# Patient Record
Sex: Female | Born: 1985 | Race: Black or African American | Hispanic: No | Marital: Married | Smoking: Former smoker
Health system: Southern US, Community
[De-identification: ages and names within clinical notes are randomized; demographics above are authoritative.]

## PROBLEM LIST (undated history)

## (undated) DIAGNOSIS — F4310 Post-traumatic stress disorder, unspecified: Secondary | ICD-10-CM

## (undated) DIAGNOSIS — G8929 Other chronic pain: Secondary | ICD-10-CM

## (undated) DIAGNOSIS — M62838 Other muscle spasm: Secondary | ICD-10-CM

## (undated) DIAGNOSIS — M549 Dorsalgia, unspecified: Secondary | ICD-10-CM

## (undated) DIAGNOSIS — J45909 Unspecified asthma, uncomplicated: Secondary | ICD-10-CM

## (undated) DIAGNOSIS — E119 Type 2 diabetes mellitus without complications: Secondary | ICD-10-CM

## (undated) DIAGNOSIS — F419 Anxiety disorder, unspecified: Secondary | ICD-10-CM

## (undated) DIAGNOSIS — D649 Anemia, unspecified: Secondary | ICD-10-CM

## (undated) DIAGNOSIS — F319 Bipolar disorder, unspecified: Secondary | ICD-10-CM

## (undated) DIAGNOSIS — I10 Essential (primary) hypertension: Secondary | ICD-10-CM

## (undated) HISTORY — DX: Bipolar disorder, unspecified: F31.9

## (undated) HISTORY — PX: WISDOM TOOTH EXTRACTION: SHX21

## (undated) HISTORY — PX: MOLE REMOVAL: SHX2046

## (undated) HISTORY — DX: Post-traumatic stress disorder, unspecified: F43.10

## (undated) HISTORY — DX: Anxiety disorder, unspecified: F41.9

## (undated) HISTORY — DX: Other muscle spasm: M62.838

## (undated) HISTORY — DX: Type 2 diabetes mellitus without complications: E11.9

---

## 2007-04-27 ENCOUNTER — Inpatient Hospital Stay (HOSPITAL_COMMUNITY): Admission: AD | Admit: 2007-04-27 | Discharge: 2007-04-27 | Payer: Self-pay | Admitting: Obstetrics and Gynecology

## 2007-04-27 ENCOUNTER — Inpatient Hospital Stay (HOSPITAL_COMMUNITY): Admission: AD | Admit: 2007-04-27 | Discharge: 2007-04-28 | Payer: Self-pay | Admitting: Obstetrics and Gynecology

## 2007-05-12 ENCOUNTER — Emergency Department (HOSPITAL_COMMUNITY): Admission: EM | Admit: 2007-05-12 | Discharge: 2007-05-12 | Payer: Self-pay | Admitting: Emergency Medicine

## 2007-10-28 ENCOUNTER — Emergency Department (HOSPITAL_COMMUNITY): Admission: EM | Admit: 2007-10-28 | Discharge: 2007-10-29 | Payer: Self-pay | Admitting: Emergency Medicine

## 2007-11-24 ENCOUNTER — Emergency Department (HOSPITAL_COMMUNITY): Admission: EM | Admit: 2007-11-24 | Discharge: 2007-11-24 | Payer: Self-pay | Admitting: Emergency Medicine

## 2008-04-03 ENCOUNTER — Emergency Department (HOSPITAL_COMMUNITY): Admission: EM | Admit: 2008-04-03 | Discharge: 2008-04-03 | Payer: Self-pay | Admitting: Emergency Medicine

## 2008-04-09 ENCOUNTER — Inpatient Hospital Stay (HOSPITAL_COMMUNITY): Admission: AD | Admit: 2008-04-09 | Discharge: 2008-04-09 | Payer: Self-pay | Admitting: Gynecology

## 2008-11-25 ENCOUNTER — Inpatient Hospital Stay (HOSPITAL_COMMUNITY): Admission: AD | Admit: 2008-11-25 | Discharge: 2008-11-27 | Payer: Self-pay | Admitting: Obstetrics and Gynecology

## 2009-12-30 ENCOUNTER — Ambulatory Visit: Payer: Self-pay | Admitting: Physician Assistant

## 2009-12-30 DIAGNOSIS — F39 Unspecified mood [affective] disorder: Secondary | ICD-10-CM | POA: Insufficient documentation

## 2009-12-30 DIAGNOSIS — J45909 Unspecified asthma, uncomplicated: Secondary | ICD-10-CM | POA: Insufficient documentation

## 2009-12-30 DIAGNOSIS — J069 Acute upper respiratory infection, unspecified: Secondary | ICD-10-CM | POA: Insufficient documentation

## 2009-12-30 DIAGNOSIS — F329 Major depressive disorder, single episode, unspecified: Secondary | ICD-10-CM

## 2009-12-30 DIAGNOSIS — G894 Chronic pain syndrome: Secondary | ICD-10-CM

## 2009-12-30 DIAGNOSIS — D509 Iron deficiency anemia, unspecified: Secondary | ICD-10-CM | POA: Insufficient documentation

## 2009-12-31 ENCOUNTER — Encounter: Payer: Self-pay | Admitting: Physician Assistant

## 2010-01-01 ENCOUNTER — Telehealth: Payer: Self-pay | Admitting: Physician Assistant

## 2010-01-01 DIAGNOSIS — E559 Vitamin D deficiency, unspecified: Secondary | ICD-10-CM | POA: Insufficient documentation

## 2010-01-01 LAB — CONVERTED CEMR LAB
AST: 21 units/L (ref 0–37)
Albumin: 4.5 g/dL (ref 3.5–5.2)
Anti Nuclear Antibody(ANA): NEGATIVE
BUN: 10 mg/dL (ref 6–23)
Barbiturate Quant, Ur: NEGATIVE
Basophils Relative: 0 % (ref 0–1)
CO2: 20 meq/L (ref 19–32)
Calcium: 9.4 mg/dL (ref 8.4–10.5)
Chloride: 107 meq/L (ref 96–112)
Cocaine Metabolites: NEGATIVE
Creatinine, Ser: 0.82 mg/dL (ref 0.40–1.20)
Creatinine,U: 247.7 mg/dL
Eosinophils Absolute: 0.2 10*3/uL (ref 0.0–0.7)
Eosinophils Relative: 4 % (ref 0–5)
Glucose, Bld: 85 mg/dL (ref 70–99)
HCT: 35.9 % — ABNORMAL LOW (ref 36.0–46.0)
Hemoglobin: 11.7 g/dL — ABNORMAL LOW (ref 12.0–15.0)
Lymphs Abs: 1.2 10*3/uL (ref 0.7–4.0)
MCHC: 32.6 g/dL (ref 30.0–36.0)
MCV: 89.1 fL (ref 78.0–100.0)
Methadone: NEGATIVE
Monocytes Absolute: 0.4 10*3/uL (ref 0.1–1.0)
Monocytes Relative: 9 % (ref 3–12)
Opiate Screen, Urine: NEGATIVE
Potassium: 4.4 meq/L (ref 3.5–5.3)
RBC: 4.03 M/uL (ref 3.87–5.11)
TSH: 0.492 microintl units/mL (ref 0.350–4.500)
WBC: 4.6 10*3/uL (ref 4.0–10.5)

## 2010-02-18 ENCOUNTER — Telehealth: Payer: Self-pay | Admitting: Physician Assistant

## 2010-03-04 ENCOUNTER — Encounter (INDEPENDENT_AMBULATORY_CARE_PROVIDER_SITE_OTHER): Payer: Self-pay | Admitting: *Deleted

## 2010-03-09 ENCOUNTER — Telehealth: Payer: Self-pay | Admitting: Physician Assistant

## 2010-03-19 ENCOUNTER — Ambulatory Visit: Payer: Self-pay | Admitting: Physician Assistant

## 2010-03-19 ENCOUNTER — Telehealth: Payer: Self-pay | Admitting: Physician Assistant

## 2010-03-19 DIAGNOSIS — E669 Obesity, unspecified: Secondary | ICD-10-CM

## 2010-03-19 DIAGNOSIS — M549 Dorsalgia, unspecified: Secondary | ICD-10-CM

## 2010-03-19 DIAGNOSIS — M545 Low back pain, unspecified: Secondary | ICD-10-CM | POA: Insufficient documentation

## 2010-03-19 DIAGNOSIS — D649 Anemia, unspecified: Secondary | ICD-10-CM | POA: Insufficient documentation

## 2010-03-20 LAB — CONVERTED CEMR LAB
Ferritin: 5 ng/mL — ABNORMAL LOW (ref 10–291)
RBC Folate: 728 ng/mL — ABNORMAL HIGH (ref 180–600)
Saturation Ratios: 10 % — ABNORMAL LOW (ref 20–55)
TIBC: 383 ug/dL (ref 250–470)
Vitamin B-12: 552 pg/mL (ref 211–911)

## 2010-04-06 ENCOUNTER — Encounter
Admission: RE | Admit: 2010-04-06 | Discharge: 2010-05-22 | Payer: Self-pay | Source: Home / Self Care | Admitting: Internal Medicine

## 2010-04-06 ENCOUNTER — Encounter: Payer: Self-pay | Admitting: Physician Assistant

## 2010-04-13 ENCOUNTER — Telehealth: Payer: Self-pay | Admitting: Physician Assistant

## 2010-04-16 ENCOUNTER — Encounter: Payer: Self-pay | Admitting: Physician Assistant

## 2010-04-24 ENCOUNTER — Ambulatory Visit: Payer: Self-pay | Admitting: Physician Assistant

## 2010-05-07 ENCOUNTER — Encounter: Payer: Self-pay | Admitting: Physician Assistant

## 2010-05-20 ENCOUNTER — Encounter: Payer: Self-pay | Admitting: Physician Assistant

## 2010-05-29 ENCOUNTER — Encounter: Payer: Self-pay | Admitting: Physician Assistant

## 2010-09-30 NOTE — Miscellaneous (Signed)
Summary: Rehab Report /INITIAL SUMMARY  Rehab Report /INITIAL SUMMARY   Imported By: Arta Bruce 04/10/2010 10:17:07  _____________________________________________________________________  External Attachment:    Type:   Image     Comment:   External Document

## 2010-09-30 NOTE — Progress Notes (Signed)
Summary: PT and Nutrition referral  Phone Note Outgoing Call   Summary of Call: Needs referral to:   1. PT for back pain  2. Nutrition for diabetes Initial call taken by: Brynda Rim,  March 19, 2010 5:36 PM  Follow-up for Phone Call        PT HAVE AN APPT [facility] 04-06-10 @ 8:45 AM I LEAVE A MESSAGE TO PT TO CALL ME BACK. NUTRICIONIST . I SEND THE REFERRAL TODAY WAITING FOR AN APPT . Follow-up by: Cheryll Dessert,  March 23, 2010 3:05 PM
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
## 2010-09-30 NOTE — Letter (Signed)
Summary: *HSN Results Follow up  [facility]  [address] [place], [place] [address]   Phone: [phone]  Fax: [phone]      03/04/2010   [facility] [address] [place], [place]  [address]   Dear  Ms. Hulan Fess,                            ____S.Drinkard,FNP   ____D. Gore,FNP       ____B. McPherson,MD   ____V. Rankins,MD    ____E. Mulberry,MD    ____N. Daphine Deutscher, FNP  ____D. Reche Dixon, MD    ____K. Philipp Deputy, MD    ____Other     This letter is to inform you that your recent test(s):  _______Pap Smear    _______Lab Test     _______X-ray    _______ is within acceptable limits  _______ requires a medication change  _______ requires a follow-up lab visit  _______ requires a follow-up visit with your provider   Comments:  We have been trying to reach you.  Please give the office a call at your earliest convenience.       _________________________________________________________ If you have any questions, please contact our office                     Sincerely,  Armenia Shannon [facility]

## 2010-09-30 NOTE — Assessment & Plan Note (Signed)
Summary: Not Seen; Medicaid Expired   Vitals Entered By: Arm[place] Shannon (May 29, 2010 10:45 AM)  Allergies: No Known Drug Allergies   Complete Medication List: 1)  Iron  .... Two times a day 2)  Proventil Hfa 108 (90 Base) Mcg/act Aers (Albuterol sulfate) .Marland Kitchen.. 1-2 puffs every 4-6 hours as needed 3)  Vitamin D 1000 Unit Tabs (Cholecalciferol) .... Take 1 tablet by mouth once a day 4)  Diclofenac Sodium 75 Mg Tbec (Diclofenac sodium) .... Take 1 tablet by mouth two times a day with food as needed for pain 5)  Flexeril 10 Mg Tabs (Cyclobenzaprine hcl) .... Take 1 tab by mouth at bedtime as needed for back pain or spasm 6)  Ferrous Sulfate 325 (65 Fe) Mg Tabs (Ferrous sulfate) .... Take 1 tablet by mouth three times a day

## 2010-09-30 NOTE — Assessment & Plan Note (Signed)
Summary: FU VISIT WITH THE PROVIDER/ KNEE PAIN/GK   Vital Signs:  Patient profile:   25 year old female Menstrual status:  regular Height:      69.75 inches Weight:      249 pounds BMI:     36.11 Temp:     98.0 degrees F oral Pulse rate:   76 / minute Pulse rhythm:   regular Resp:     18 per minute BP sitting:   114 / 73  (left arm) Cuff size:   large  Vitals Entered By: Arm[place] Shannon (March 19, 2010 4:11 PM) CC: pt is here for back pain.... pt says she is not depressed so she has not taken any meds... . Is Patient Diabetic? No Pain Assessment Patient in pain? no       Does patient need assistance? Functional Status Self care Ambulation Normal   Primary Care Provider:  Tereso Newcomer, PA-C  CC:  pt is here for back pain.... pt says she is not depressed so she has not taken any meds... ..  History of Present Illness: Here for back pain.  Notes injury years ago.  Was hit during  tackle football.  Notes having epidural for labor and delivery and had worsening back pain since.  Points to pain in thoracic and lumbar area.  Hurts to walk.  Notes pain in legs and ankles and cramps in muscles.  Notes when she wakes up in mornings.  No loss of bowel or bladder control. Notes numbness and tingling.  Very nonspecific and points to multiple area when describing pain.  Says ankles are weak.  Went to get special shoes and this is not working.  She is going to the [employer] to start working out.  Wants help with weight loss.  She did not take Cymbalta.  Told CMA it was against her religion.  Remember her PHQ9=22 last time.  Problems Prior to Update: 1)  Obesity  (ICD-278.00) 2)  Back Pain  (ICD-724.5) 3)  Anemia, Mild  (ICD-285.9) 4)  Vitamin D Deficiency  (ICD-268.9) 5)  Viral Uri  (ICD-465.9) 6)  Preventive Health Care  (ICD-V70.0) 7)  Chronic Pain Syndrome  (ICD-338.4) 8)  Depression  (ICD-311) 9)  Asthma  (ICD-493.90) 10)  Anemia-iron Deficiency  (ICD-280.9)  Current Medications  (verified): 1)  Iron .... Two Times A Day 2)  Cymbalta 30 Mg Cpep (Duloxetine Hcl) .... Take 1 Tablet By Mouth Once A Day 3)  Proventil Hfa 108 (90 Base) Mcg/act Aers (Albuterol Sulfate) .Marland Kitchen.. 1-2 Puffs Every 4-6 Hours As Needed 4)  Vitamin D 1000 Unit Tabs (Cholecalciferol) .... Take 1 Tablet By Mouth Once A Day  Allergies (verified): No Known Drug Allergies  Past History:  Past Medical History: Last updated: 12/30/2009 Anemia-iron deficiency Asthma  Past Surgical History: Last updated: 12/30/2009 h/o mole removed from neck s/p 4 wisdom teeth extraction  Physical Exam  General:  alert, well-developed, and well-nourished.   Head:  normocephalic and atraumatic.   Eyes:  pupils equal, pupils round, and pupils reactive to light.   Mouth:  pharynx pink and moist.   Lungs:  normal breath sounds.   Heart:  normal rate and regular rhythm.   Msk:  + tend of paraspinal muscles in upper lumbar area Neg SLR test bilat  Neurologic:  BUE and BLE strength is normal and equal  DTRs:  biceps, brachio, patellar and achilles 2+ bilat  Psych:  normally interactive.     Impression & Recommendations:  Problem # 1:  ANEMIA, MILD (ICD-285.9) notes some paresthesias had just mild low Hgb with prior cbc will check B12 and folate with paresthesias  Orders: T-Iron [phone]) T-Iron Binding Capacity (TIBC) ([id number]) T-Ferritin [id number]) T-Vitamin B12 (10272-53664) T-Folic Acid; RBC (40347-42595)  Problem # 2:  OBESITY (ICD-278.00) refer to nutrition  Problem # 3:  BACK PAIN (ICD-724.5)  still think related to depression nsaids flexeril PT referral f/u as needed  Orders: Diagnostic X-Ray/Fluoroscopy (Diagnostic X-Ray/Flu) Nutrition Referral (Nutrition) Physical Therapy Referral (PT)  Her updated medication list for this problem includes:    Diclofenac Sodium 75 Mg Tbec (Diclofenac sodium) .Marland Kitchen... Take 1 tablet by mouth two times a day with food as needed for pain     Flexeril 10 Mg Tabs (Cyclobenzaprine hcl) .Marland Kitchen... Take 1 tab by mouth at bedtime as needed for back pain or spasm  Problem # 4:  DEPRESSION (ICD-311) Diane Rice does not want to take SSRI encouraged her to see A. Ananias Pilgrim  The following medications were removed from the medication list:    Cymbalta 30 Mg Cpep (Duloxetine hcl) .Marland Kitchen... Take 1 tablet by mouth once a day  Problem # 5:  VITAMIN D DEFICIENCY (ICD-268.9) start meds  Complete Medication List: 1)  Iron  .... Two times a day 2)  Proventil Hfa 108 (90 Base) Mcg/act Aers (Albuterol sulfate) .Marland Kitchen.. 1-2 puffs every 4-6 hours as needed 3)  Vitamin D 1000 Unit Tabs (Cholecalciferol) .... Take 1 tablet by mouth once a day 4)  Diclofenac Sodium 75 Mg Tbec (Diclofenac sodium) .... Take 1 tablet by mouth two times a day with food as needed for pain 5)  Flexeril 10 Mg Tabs (Cyclobenzaprine hcl) .... Take 1 tab by mouth at bedtime as needed for back pain or spasm  Other Orders: T-Sed Rate (Automated) ([id number])  Patient Instructions: 1)  Please consider scheduling an appointment with Ethelene Browns (our counselor).  Call when you are ready. 2)  Someone will call you to set up nutrition and physical therapy. 3)  Please schedule a follow-up appointment in 3 months with Scott for CPP.  Come fasting for labs. 4)  You can take tylenol with the medicines I gave you. 5)  Take 650 - 1000 mg of tylenol every 4-6 hours as needed for relief of pain or comfort of fever. Avoid taking more than 4000 mg in a 24 hour period( can cause liver damage in higher doses).  Prescriptions: VITAMIN D 1000 UNIT TABS (CHOLECALCIFEROL) Take 1 tablet by mouth once a day  #30 x 11   Entered and Authorized by:   Tereso Newcomer PA-C   Signed by:   Tereso Newcomer PA-C on 03/19/2010   Method used:   Print then Give to Patient   RxID:   [id number] FLEXERIL 10 MG TABS (CYCLOBENZAPRINE HCL) Take 1 tab by mouth at bedtime as needed for back pain or spasm  #20 x 2    Entered and Authorized by:   Tereso Newcomer PA-C   Signed by:   Tereso Newcomer PA-C on 03/19/2010   Method used:   Print then Give to Patient   RxID:   [id number] DICLOFENAC SODIUM 75 MG TBEC (DICLOFENAC SODIUM) Take 1 tablet by mouth two times a day with food as needed for pain  #30 x 2   Entered and Authorized by:   Tereso Newcomer PA-C   Signed by:   Tereso Newcomer PA-C on 03/19/2010   Method used:   Print then Give to Patient   RxID:   [id number]

## 2010-09-30 NOTE — Miscellaneous (Signed)
Summary: Rehab Report//DISCHARGE SUMMARY  Rehab Report//DISCHARGE SUMMARY   Imported By: Arta Bruce 06/05/2010 14:29:10  _____________________________________________________________________  External Attachment:    Type:   Image     Comment:   External Document

## 2010-09-30 NOTE — Letter (Signed)
Summary: PT INFORMATION SHEET  PT INFORMATION SHEET   Imported By: Arta Bruce 02/26/2010 11:03:59  _____________________________________________________________________  External Attachment:    Type:   Image     Comment:   External Document

## 2010-09-30 NOTE — Progress Notes (Signed)
Summary: Having bleeding between menses cycles  Phone Note Call from Patient   Summary of Call: Pt just stopped her period, but is still having "gushes" of blood and pain and wants to be seen.  Pt can be reached at [phone]. Initial call taken by: Vesta Mixer CMA,  April 13, 2010 2:56 PM  Follow-up for Phone Call        Period stopped 04/08/10; thinks it started 7/31, stopped for four days, then started back up.  Yesterday evening, had two big gushes of blood then had very little blood.  It happened again today x3, with accompanying lower abdominal pain, non-radiating, like sharp pains.  Last "gush" was about one hour ago.  Denies fever, vomiting.  C/O some nausea, fatigue and weakness.  Needs GYN referral. Follow-up by: Dutch Quint RN,  April 13, 2010 3:12 PM  Additional Follow-up for Phone Call Additional follow up Details #1::        Tried to call, but just kept disconnecting. Please see if can work her in here next 2 days. Additional Follow-up by: Julieanne Manson MD,  April 13, 2010 6:12 PM    Additional Follow-up for Phone Call Additional follow up Details #2::    Left message on voicemail for pt. to return call.  Dutch Quint RN  April 14, 2010 10:48 AM  Left message on voicemail for pt. to return call.  Dutch Quint RN  April 15, 2010 2:30 PM  Left message on voicemail for pt. to return call.  Letter sent.  Dutch Quint RN  April 16, 2010 4:10 PM    Appended Document: Having bleeding between menses cycles    Phone Note Outgoing Call   Summary of Call: Was she ever reached? Tereso Newcomer PA-C  April 19, 2010 8:59 PM   Follow-up for Phone Call        Calls going to voicemail or answering machine.  Other numbers that were listed were invalid. Dutch Quint RN  April 20, 2010 9:24 AM

## 2010-09-30 NOTE — Progress Notes (Signed)
Summary: BAD PAIN ON RIGHT SIDE  Phone Note Call from Patient Call back at Home Phone [phone]   Summary of Call: Cybil Senegal PT. MS Stall CALLED BECAUSE SHE IS HAVING A STRONG PULLING TYPE PAIN ON HER RIGHT UPPER SHOULDER AREA AND ITS REALLY PAINFUL. Initial call taken by: Leodis Rains,  March 09, 2010 11:16 AM  Follow-up for Phone Call        spoke with pt and she says she has a strong pulling feeling in her shoulder and back... pt says it diffcults to reach behind her.... pt says she took tyenlol, aleve and ibuprofen either helped.... pt says she has all body ache Follow-up by: Armenia Shannon,  March 10, 2010 8:44 AM  Additional Follow-up for Phone Call Additional follow up Details #1::        She will need an appt for evaluation. Additional Follow-up by: Tereso Newcomer PA-C,  March 10, 2010 9:00 PM    Additional Follow-up for Phone Call Additional follow up Details #2::    i tried to give pt appt on july 26 but she is going out of town... pt says she will call us for an appt Follow-up by: Armenia Shannon,  March 11, 2010 10:27 AM

## 2010-09-30 NOTE — Letter (Signed)
Summary: NUTRITION & DIABETES/DID NOT KEEP APPT  NUTRITION & DIABETES/DID NOT KEEP APPT   Imported By: Arta Bruce 05/18/2010 15:24:12  _____________________________________________________________________  External Attachment:    Type:   Image     Comment:   External Document
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
## 2010-09-30 NOTE — Letter (Signed)
Summary: requested records fro self  requested records fro self   Imported By: Arta Bruce 06/01/2010 12:12:12  _____________________________________________________________________  External Attachment:    Type:   Image     Comment:   External Document

## 2010-09-30 NOTE — Letter (Signed)
Summary: REFERRAL//PHYSICAL THERAPY//APPT DATE &TIME  REFERRAL//PHYSICAL THERAPY//APPT DATE &TIME   Imported By: Arta Bruce 03/23/2010 15:14:57  _____________________________________________________________________  External Attachment:    Type:   Image     Comment:   External Document

## 2010-09-30 NOTE — Miscellaneous (Signed)
  Clinical Lists Changes  Observations: Added new observation of PAST MED HX: Anemia-iron deficiency Asthma Back pain   a.  Ref to PT 03/2010; patient d/c due to noncompliance with attendance policy (05/29/2010 15:07)       Past History:  Past Medical History: Anemia-iron deficiency Asthma Back pain   a.  Ref to PT 03/2010; patient d/c due to noncompliance with attendance policy

## 2010-09-30 NOTE — Progress Notes (Signed)
  Phone Note From Other Clinic   Summary of Call: Call from [facility] labs not enough blood to run sed rate. Initial call taken by: Vesta Mixer CMA,  Jan 01, 2010 9:09 AM  Follow-up for Phone Call        Left message on answering machine for pt to call back...Marland KitchenMarland KitchenArmenia Shannon  Jan 02, 2010 12:52 PM will inform pt on append note

## 2010-09-30 NOTE — Letter (Signed)
Summary: Generic Letter  [facility]  [address] [place], [place] [address]   Phone: [phone]  Fax: [phone]        04/16/2010  [facility] [address] [place], [place]  [address]  Dear Diane Rice,  We have been unable to contact you by telephone regarding your call to our office earlier this week.  Please call our office, at your earliest convenience, so that we may speak with you.   Sincerely,   Dutch Quint RN

## 2010-09-30 NOTE — Progress Notes (Signed)
Summary: Office Visit//DEPRESSION SCREENING  Office Visit//DEPRESSION SCREENING   Imported By: Arta Bruce 02/26/2010 14:38:03  _____________________________________________________________________  External Attachment:    Type:   Image     Comment:   External Document

## 2010-10-02 ENCOUNTER — Encounter: Payer: Self-pay | Admitting: Physician Assistant

## 2010-10-07 NOTE — Letter (Signed)
Summary: REQUESTED RECORDS MAILED TO [facility]  REQUESTED RECORDS MAILED TO [facility]   Imported By: Arta Bruce 10/02/2010 15:38:14  _____________________________________________________________________  External Attachment:    Type:   Image     Comment:   External Document

## 2010-12-10 LAB — RPR: RPR Ser Ql: NONREACTIVE

## 2010-12-10 LAB — CBC
MCHC: 33.5 g/dL (ref 30.0–36.0)
MCV: 86.5 fL (ref 78.0–100.0)
Platelets: 213 10*3/uL (ref 150–400)
Platelets: 242 10*3/uL (ref 150–400)
RDW: 17 % — ABNORMAL HIGH (ref 11.5–15.5)
WBC: 7.5 10*3/uL (ref 4.0–10.5)

## 2011-01-12 NOTE — Discharge Summary (Signed)
Diane Rice, Diane Rice          ACCOUNT NO.:  [id number]   MEDICAL RECORD NO.:  [id number]          PATIENT TYPE:  INP   LOCATION:  9102                          FACILITY:  [facility]   PHYSICIAN:  Sherron Monday, MD        DATE OF BIRTH:  May 23, 1986   DATE OF ADMISSION:  11/25/2008  DATE OF DISCHARGE:                               DISCHARGE SUMMARY   MAIN DIAGNOSIS:  Intrauterine pregnancy at term with spontaneous rupture  of membranes.   DISCHARGE DIAGNOSIS:  Intrauterine pregnancy at term with spontaneous  rupture of membranes, delivered, a spontaneous vaginal delivery.   HISTORY OF PRESENT ILLNESS:  A 25 year old G2, P1-0-0-1 at 65 weeks by 64-  week ultrasound dating her with an [facility] of November 29, 2008, presents with  rupture of membranes, leaking since 1:30 a.m. with occasional  contractions.  Evaluation in the MAU confirmed rupture of membranes and  cervical exam was 6 cm dilated.  Her prenatal care was relatively  uncomplicated.   PAST MEDICAL HISTORY:  Not significant.   PAST SURGICAL HISTORY:  Significant for a nevus removal on her neck.   PAST OB/GYN HISTORY:  G1 was in 2003, spontaneous delivery of 40 weeks,  7-pound-10 ounce female infant.  G2 is the present pregnancy.  No  abnormal Pap smears or sexually transmitted diseases.   MEDICATIONS:  Prenatal vitamins.   ALLERGIES:  No known drug allergies.   SOCIAL HISTORY:  The patient denies alcohol, tobacco, or drug use.  She  was afebrile, vital signs stable with a benign exam.  Fetal heart tones  were reassuring without decelerations and irregular contractions.   PRENATAL LABORATORY DATA:  RPR nonreactive, hepatitis B surface antigen  negative, rubella immune.  Hepatitis and HIV negative.  O+ screen  negative, gonorrhea and chlamydia negative.  Cystic fibrosis screen  negative.  First trimester screen within normal limits.  AFP within  normal limits.  Glucola 400.  Group B strep was negative.   HOSPITAL COURSE:  She was  admitted and Pitocin was started to augment  her contractions.  She progressed to complete +3, pushed 2 times to  deliver a viable female infant at 9:02 a.m. on November 25, 2008, with Apgars  of 9 at 1 minute and 9 at 5 minutes and a weight of 7 pounds 6 ounces.  Placenta was expressed intact at 9:10 with a first-degree perineal  laceration, bilateral labial lacerations, repaired with 3-0 Vicryl in  typical fashion.  EBL was less than 500 mL.  Her postpartum course was  relatively uncomplicated.  She remained afebrile.  Vital signs stable.  On day of discharge postpartum day #2, she was ambulating well, voiding  without difficulty, tolerating p.o.  Her pain was well controlled.  She  had normal lochia.  She was discharged to home with routine discharge  instructions and  numbers to call if any questions or problems.  Hemoglobin had decreased  from 10 to 9.8.  She was given prescriptions for Motrin, Vicodin, and  prenatal vitamins.  She was discharged to home.  She is O+, rubella  immune.  Hemoglobin decreased from 10  to 9.8.  She is breast-feeding and  will use condoms for contraception.      Sherron Monday, MD  Electronically Signed     JB/MEDQ  D:  11/27/2008  T:  11/27/2008  Job:  [id number]

## 2011-05-24 LAB — I-STAT 8, (EC8 V) (CONVERTED LAB)
Bicarbonate: 22.3
Glucose, Bld: 85
Potassium: 3.9
TCO2: 23
pH, Ven: 7.462 — ABNORMAL HIGH

## 2011-05-24 LAB — URINALYSIS, ROUTINE W REFLEX MICROSCOPIC
Nitrite: NEGATIVE
Specific Gravity, Urine: 1.023
Urobilinogen, UA: 1

## 2011-05-24 LAB — POCT PREGNANCY, URINE
Operator id: 222501
Preg Test, Ur: NEGATIVE

## 2011-05-28 LAB — URINALYSIS, ROUTINE W REFLEX MICROSCOPIC
Ketones, ur: NEGATIVE
Nitrite: NEGATIVE
pH: 7

## 2011-05-28 LAB — DIFFERENTIAL
Basophils Relative: 1
Eosinophils Absolute: 0.1
Eosinophils Relative: 1
Monocytes Absolute: 0.5
Monocytes Relative: 8

## 2011-05-28 LAB — BASIC METABOLIC PANEL
CO2: 25
Chloride: 107
Creatinine, Ser: 0.78
GFR calc Af Amer: >60
Potassium: 4
Sodium: 138

## 2011-05-28 LAB — CBC
HCT: 33.4 — ABNORMAL LOW
Hemoglobin: 11 — ABNORMAL LOW
MCHC: 32.8
MCV: 86.4
RBC: 3.87

## 2011-05-28 LAB — PREGNANCY, URINE: Preg Test, Ur: POSITIVE

## 2011-05-28 LAB — WET PREP, GENITAL: Yeast Wet Prep HPF POC: NONE SEEN

## 2011-06-11 LAB — URINALYSIS, ROUTINE W REFLEX MICROSCOPIC
Bilirubin Urine: NEGATIVE
Bilirubin Urine: NEGATIVE
Glucose, UA: NEGATIVE
Ketones, ur: NEGATIVE
Ketones, ur: 15 — AB
Leukocytes, UA: NEGATIVE
Nitrite: NEGATIVE
Nitrite: NEGATIVE
Protein, ur: NEGATIVE
Specific Gravity, Urine: 1.024
Urobilinogen, UA: 0.2
Urobilinogen, UA: 1
pH: 6

## 2011-06-11 LAB — POCT PREGNANCY, URINE
Operator id: 28833
Preg Test, Ur: NEGATIVE

## 2011-06-11 LAB — DIFFERENTIAL
Basophils Absolute: 0
Eosinophils Relative: 2
Lymphocytes Relative: 30
Monocytes Absolute: 0.5
Neutro Abs: 3.5
Neutrophils Relative %: 60

## 2011-06-11 LAB — CBC
HCT: 31.5 — ABNORMAL LOW
Hemoglobin: 10.6 — ABNORMAL LOW
MCHC: 33.5
MCV: 91.8
Platelets: 292
RBC: 3.43 — ABNORMAL LOW
RDW: 15.6 — ABNORMAL HIGH
WBC: 5.8

## 2011-06-11 LAB — URINE MICROSCOPIC-ADD ON

## 2011-06-11 LAB — WET PREP, GENITAL
Trich, Wet Prep: NONE SEEN
Yeast Wet Prep HPF POC: NONE SEEN

## 2011-06-11 LAB — I-STAT 8, (EC8 V) (CONVERTED LAB)
Acid-base deficit: 1
HCT: 35 — ABNORMAL LOW
Hemoglobin: 11.9 — ABNORMAL LOW
Operator id: 288331
Potassium: 4.4
Sodium: 137
TCO2: 25

## 2011-06-11 LAB — GC/CHLAMYDIA PROBE AMP, GENITAL
Chlamydia, DNA Probe: POSITIVE — AB
GC Probe Amp, Genital: NEGATIVE

## 2011-06-11 LAB — POCT I-STAT CREATININE
Creatinine, Ser: 1.1
Operator id: 288331

## 2012-06-02 ENCOUNTER — Encounter (HOSPITAL_COMMUNITY): Payer: Self-pay | Admitting: *Deleted

## 2012-06-02 ENCOUNTER — Emergency Department (HOSPITAL_COMMUNITY): Payer: No Typology Code available for payment source

## 2012-06-02 ENCOUNTER — Emergency Department (HOSPITAL_COMMUNITY)
Admission: EM | Admit: 2012-06-02 | Discharge: 2012-06-02 | Disposition: A | Discharge status: Home / Self Care | Payer: No Typology Code available for payment source | Attending: Emergency Medicine | Admitting: Emergency Medicine

## 2012-06-02 DIAGNOSIS — Y998 Other external cause status: Secondary | ICD-10-CM | POA: Insufficient documentation

## 2012-06-02 DIAGNOSIS — M79643 Pain in unspecified hand: Secondary | ICD-10-CM

## 2012-06-02 DIAGNOSIS — Y93I9 Activity, other involving external motion: Secondary | ICD-10-CM | POA: Insufficient documentation

## 2012-06-02 DIAGNOSIS — S6990XA Unspecified injury of unspecified wrist, hand and finger(s), initial encounter: Secondary | ICD-10-CM | POA: Insufficient documentation
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
## 2012-11-20 ENCOUNTER — Encounter (HOSPITAL_COMMUNITY): Payer: Self-pay | Admitting: Emergency Medicine

## 2012-11-20 ENCOUNTER — Emergency Department (HOSPITAL_COMMUNITY)
Admission: EM | Admit: 2012-11-20 | Discharge: 2012-11-20 | Disposition: A | Discharge status: Home / Self Care | Payer: Medicaid Other | Attending: Emergency Medicine | Admitting: Emergency Medicine

## 2012-11-20 DIAGNOSIS — Z3202 Encounter for pregnancy test, result negative: Secondary | ICD-10-CM | POA: Insufficient documentation

## 2012-11-20 DIAGNOSIS — N644 Mastodynia: Secondary | ICD-10-CM | POA: Insufficient documentation

## 2012-11-20 LAB — POCT PREGNANCY, URINE: Preg Test, Ur: NEGATIVE
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
## 2012-11-20 NOTE — ED Notes (Signed)
Pt returned to be seen, states she had taken her child to pediatrics to be seen

## 2013-01-19 ENCOUNTER — Emergency Department (HOSPITAL_COMMUNITY)
Admission: EM | Admit: 2013-01-19 | Discharge: 2013-01-19 | Disposition: A | Discharge status: Home / Self Care | Payer: Medicaid Other | Attending: Emergency Medicine | Admitting: Emergency Medicine

## 2013-01-19 ENCOUNTER — Encounter (HOSPITAL_COMMUNITY): Payer: Self-pay | Admitting: Emergency Medicine

## 2013-01-19 DIAGNOSIS — Z8739 Personal history of other diseases of the musculoskeletal system and connective tissue: Secondary | ICD-10-CM | POA: Insufficient documentation

## 2013-01-19 DIAGNOSIS — J45909 Unspecified asthma, uncomplicated: Secondary | ICD-10-CM | POA: Insufficient documentation

## 2013-01-19 DIAGNOSIS — K0889 Other specified disorders of teeth and supporting structures: Secondary | ICD-10-CM

## 2013-01-19 DIAGNOSIS — Z3202 Encounter for pregnancy test, result negative: Secondary | ICD-10-CM | POA: Insufficient documentation

## 2013-01-19 DIAGNOSIS — D649 Anemia, unspecified: Secondary | ICD-10-CM | POA: Insufficient documentation

## 2013-01-19 DIAGNOSIS — K089 Disorder of teeth and supporting structures, unspecified: Secondary | ICD-10-CM | POA: Insufficient documentation

## 2013-01-19 HISTORY — DX: Other chronic pain: M54.9

## 2013-01-19 HISTORY — DX: Unspecified asthma, uncomplicated: J45.909

## 2013-01-19 HISTORY — DX: Other chronic pain: G89.29

## 2013-01-19 HISTORY — DX: Anemia, unspecified: D64.9

## 2013-01-19 MED ORDER — HYDROCODONE-ACETAMINOPHEN 5-325 MG PO TABS: 1.0000 | ORAL_TABLET | ORAL | Status: DC | PRN

## 2013-01-19 MED ORDER — PENICILLIN V POTASSIUM 500 MG PO TABS: 500.0000 mg | ORAL_TABLET | Freq: Four times a day (QID) | ORAL | Status: AC

## 2013-01-19 NOTE — ED Notes (Signed)
Pt and child given crackers as requested

## 2013-01-19 NOTE — ED Notes (Addendum)
Pt c/o left side upper and lower toothache x 2 weeks. Pt attempting to get into Dentist but she does not have her medicaid card. Pt took ibuprofen today at 0800 without relief. Pt also requesting pregnancy test.

## 2013-01-19 NOTE — ED Provider Notes (Signed)
History     CSN: [id number]  Arrival date & time 01/19/13  1021   First MD Initiated Contact with Patient 01/19/13 1050      Chief Complaint  Patient presents with  . Dental Pain    (Consider location/radiation/quality/duration/timing/severity/associated sxs/prior treatment) HPI Comments: Diane Rice is a 27 y/o F with PMHx of asthma, anemia, back pain, chronic presenting to the Ed with dental pain. Patient stated that the pain started approximately 2 weeks ago - reported that it was to the upper molar on the left side - radiation to the left cheek and left temple region. Stated that she has a constant sharp, shooting pain sensation that gets worse with chewing food. Stated that she has been using Ibuprofen, Aleve with little relief. Denied fever, chills, abscess, cyst, drainage, bleeding. Has not seen dentist in many years.   Patient is a 27 y.o. female presenting with tooth pain. The history is provided by the patient. No language interpreter was used.  Dental Pain Associated symptoms: no fever, no headaches and no neck pain     Past Medical History  Diagnosis Date  . Asthma   . Anemia   . Back pain, chronic     Past Surgical History  Procedure Laterality Date  . Mole removal    . Wisdom tooth extraction      No family history on file.  History  Substance Use Topics  . Smoking status: Never Smoker   . Smokeless tobacco: Not on file  . Alcohol Use: Yes    OB History   Grav Para Term Preterm Abortions TAB SAB Ect Mult Living                  Review of Systems  Constitutional: Negative for fever, chills and fatigue.  HENT: Positive for dental problem. Negative for sore throat, trouble swallowing, neck pain and neck stiffness.   Eyes: Negative for photophobia, pain and visual disturbance.  Respiratory: Negative for cough, chest tightness and shortness of breath.   Gastrointestinal: Negative for nausea, vomiting, diarrhea, constipation, blood in stool,  abdominal distention and anal bleeding.  Genitourinary: Negative for dysuria, decreased urine volume and difficulty urinating.  Musculoskeletal: Negative for arthralgias.  Skin: Negative for rash and wound.  Neurological: Negative for dizziness, weakness, light-headedness, numbness and headaches.  All other systems reviewed and are negative.    Allergies  Review of patient's allergies indicates no known allergies.  Home Medications   Current Outpatient Rx  Name  Route  Sig  Dispense  Refill  . cholecalciferol (VITAMIN D-400) 400 UNITS TABS   Oral   Take 400 Units by mouth daily.         . ferrous sulfate 325 (65 FE) MG tablet   Oral   Take 325 mg by mouth 3 (three) times daily with meals.         . folic acid (FOLVITE) 400 MCG tablet   Oral   Take 400 mcg by mouth daily.         Marland Kitchen ibuprofen (ADVIL,MOTRIN) 200 MG tablet   Oral   Take 800-1,200 mg by mouth 2 (two) times daily as needed for pain.         . Multiple Vitamin (MULTIVITAMIN WITH MINERALS) TABS   Oral   Take 1 tablet by mouth daily.         . vitamin B-12 (CYANOCOBALAMIN) 100 MCG tablet   Oral   Take 100 mcg by mouth daily.         Marland Kitchen  HYDROcodone-acetaminophen (NORCO) 5-325 MG per tablet   Oral   Take 1 tablet by mouth every 4 (four) hours as needed for pain.   6 tablet   0   . penicillin v potassium (VEETID) 500 MG tablet   Oral   Take 1 tablet (500 mg total) by mouth 4 (four) times daily.   40 tablet   0     BP 115/70  Pulse 64  Temp(Src) 98.8 F (37.1 C) (Oral)  Resp 18  SpO2 98%  LMP 12/28/2012  Physical Exam  Nursing note and vitals reviewed. Constitutional: She is oriented to person, place, and time. She appears well-developed and well-nourished. No distress.  HENT:  Head: Normocephalic and atraumatic.  Mouth/Throat: Oropharynx is clear and moist. No oropharyngeal exudate.  Uvula midline, symmetrical elevation.  Face/Mouth: Negative facial swelling or asymmetry noted.  Negative lesions, sores erythema, drainage, discharge coming from buccal mucosa bilaterally. Negative abscess or cyst noted. Negative sublingual lesion noted. Cracked left upper second molar noted. Pain upon palpation to the left upper second molar with tongue depressor. Negative trismus.  Eyes: Conjunctivae and EOM are normal. Pupils are equal, round, and reactive to light. Right eye exhibits no discharge. Left eye exhibits no discharge.  Neck: Normal range of motion. Neck supple. No tracheal deviation present. No thyromegaly present.  Negative neck stiffness  Negative nuchal rigidity  Negative lymphadenopathy  Cardiovascular: Normal rate, regular rhythm and normal heart sounds.  Exam reveals no friction rub.   No murmur heard. Pulmonary/Chest: Effort normal and breath sounds normal. No respiratory distress. She has no wheezes. She has no rales.  Lymphadenopathy:    She has no cervical adenopathy.  Neurological: She is alert and oriented to person, place, and time. No cranial nerve deficit. She exhibits normal muscle tone. Coordination normal.  Cranial nerves III-XII grossly intact  Skin: Skin is warm and dry. No rash noted. She is not diaphoretic. No erythema.  Psychiatric: She has a normal mood and affect. Her behavior is normal. Thought content normal.    ED Course  Procedures (including critical care time)  Labs Reviewed  PREGNANCY, URINE   No results found.   1. Pain, dental       MDM  Patient afebrile, normotensive, non-tachycardic, non-tachypneic, adequate saturation on room air, alert and oriented. Patient aseptic, non-toxic appearing, in no acute distress. Negative trismus and sublingual abscess - less likely Ludwig's angina. No sign of infection or abscess. Patient discharged. Discharged patient with pain medications and antibiotics for coverage. Referred patient to dentist. Discussed with patient dental care. References for dental care given. Discussed with patient to  monitor symptoms and if symptoms are to worsen or change to report back to the ED. Patient agreed with plan of care, understood, all questions answered.         Raymon Mutton, PA-C 01/19/13 1751

## 2013-01-19 NOTE — ED Notes (Signed)
Pt states that she has had left side mouth pain x 2 weeks states has a tooth upper left that is crumbling and left lower that has been hurting pain rads to jaw and neck

## 2013-01-22 NOTE — ED Provider Notes (Signed)
Medical screening examination/treatment/procedure(s) were performed by non-physician practitioner and as supervising physician I was immediately available for consultation/collaboration.   Charles B. Sheldon, MD 01/22/13 1259 

## 2013-03-16 ENCOUNTER — Emergency Department (HOSPITAL_COMMUNITY)
Admission: EM | Admit: 2013-03-16 | Discharge: 2013-03-16 | Disposition: A | Discharge status: Home / Self Care | Payer: Medicaid Other | Attending: Emergency Medicine | Admitting: Emergency Medicine

## 2013-03-16 ENCOUNTER — Encounter (HOSPITAL_COMMUNITY): Payer: Self-pay | Admitting: *Deleted

## 2013-03-16 DIAGNOSIS — G8929 Other chronic pain: Secondary | ICD-10-CM | POA: Insufficient documentation

## 2013-03-16 DIAGNOSIS — Z3202 Encounter for pregnancy test, result negative: Secondary | ICD-10-CM | POA: Insufficient documentation

## 2013-03-16 DIAGNOSIS — Z79899 Other long term (current) drug therapy: Secondary | ICD-10-CM | POA: Insufficient documentation

## 2013-03-16 DIAGNOSIS — T40605A Adverse effect of unspecified narcotics, initial encounter: Secondary | ICD-10-CM | POA: Insufficient documentation

## 2013-03-16 DIAGNOSIS — D649 Anemia, unspecified: Secondary | ICD-10-CM | POA: Insufficient documentation

## 2013-03-16 DIAGNOSIS — T7840XA Allergy, unspecified, initial encounter: Secondary | ICD-10-CM

## 2013-03-16 DIAGNOSIS — L299 Pruritus, unspecified: Secondary | ICD-10-CM | POA: Insufficient documentation

## 2013-03-16 DIAGNOSIS — J45901 Unspecified asthma with (acute) exacerbation: Secondary | ICD-10-CM | POA: Insufficient documentation

## 2013-03-16 DIAGNOSIS — M549 Dorsalgia, unspecified: Secondary | ICD-10-CM | POA: Insufficient documentation

## 2013-03-16 LAB — POCT PREGNANCY, URINE: Preg Test, Ur: NEGATIVE

## 2013-03-16 MED ORDER — FAMOTIDINE 20 MG PO TABS
20.0000 mg | ORAL_TABLET | Freq: Once | ORAL | Status: AC
  Administered 2013-03-16: 20 mg via ORAL
  Filled 2013-03-16: qty 1

## 2013-03-16 MED ORDER — CLINDAMYCIN HCL 150 MG PO CAPS: 300.0000 mg | ORAL_CAPSULE | Freq: Three times a day (TID) | ORAL | Status: DC

## 2013-03-16 MED ORDER — PREDNISONE 20 MG PO TABS
60.0000 mg | ORAL_TABLET | Freq: Once | ORAL | Status: AC
  Administered 2013-03-16: 60 mg via ORAL
  Filled 2013-03-16: qty 3

## 2013-03-16 MED ORDER — DIPHENHYDRAMINE HCL 25 MG PO CAPS
50.0000 mg | ORAL_CAPSULE | Freq: Once | ORAL | Status: AC
  Administered 2013-03-16: 50 mg via ORAL
  Filled 2013-03-16: qty 1

## 2013-03-16 MED ORDER — PREDNISONE 20 MG PO TABS: 60.0000 mg | ORAL_TABLET | Freq: Every day | ORAL | Status: DC

## 2013-03-16 MED ORDER — DIPHENHYDRAMINE HCL 25 MG PO TABS: 25.0000 mg | ORAL_TABLET | Freq: Four times a day (QID) | ORAL | Status: DC

## 2013-03-16 MED ORDER — FAMOTIDINE 20 MG PO TABS: 20.0000 mg | ORAL_TABLET | Freq: Two times a day (BID) | ORAL | Status: DC

## 2013-03-16 NOTE — ED Notes (Signed)
Pt drank 4 cups of water without vomiting.
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
## 2013-03-16 NOTE — ED Notes (Addendum)
Pt reports not drinking much yesterday or this AM after taking hydrocodone. Instructed pt these are typical side effects from pain medications. Pt given cup of water for po trial. Also c/o itching. Also reinforced these are side effects from pain medicine.

## 2013-03-16 NOTE — ED Notes (Signed)
No skin rash noted.

## 2013-03-16 NOTE — ED Provider Notes (Signed)
History    CSN: [id number] Arrival date & time 03/16/13  0816  First MD Initiated Contact with Patient 03/16/13 [phone]     Chief Complaint  Patient presents with  . Allergic Reaction   (Consider location/radiation/quality/duration/timing/severity/associated sxs/prior Treatment) Patient is a 27 y.o. female presenting with allergic reaction. The history is provided by the patient. No language interpreter was used.  Allergic Reaction Presenting symptoms: itching   Presenting symptoms: no difficulty swallowing   Severity:  Moderate Prior allergic episodes:  No prior episodes Context comment:  Started Amoxil and Norco last night for dental infection. She has had both medications without reaction in the past.  Relieved by:  Nothing (She has generalized itching without rash. She felt SOB upon awakening that resolved spontaneously. She has a history of asthma.) Ineffective treatments:  Antihistamines  Past Medical History  Diagnosis Date  . Asthma   . Anemia   . Back pain, chronic    Past Surgical History  Procedure Laterality Date  . Mole removal    . Wisdom tooth extraction     History reviewed. No pertinent family history. History  Substance Use Topics  . Smoking status: Never Smoker   . Smokeless tobacco: Not on file  . Alcohol Use: Yes   OB History   Grav Para Term Preterm Abortions TAB SAB Ect Mult Living                 Review of Systems  Constitutional: Negative for fever and chills.  HENT: Negative.  Negative for trouble swallowing.   Respiratory: Positive for shortness of breath.   Cardiovascular: Negative.   Gastrointestinal: Negative.  Negative for nausea.  Musculoskeletal: Negative.   Skin: Positive for itching.       See HPI.  Neurological: Negative.     Allergies  Review of patient's allergies indicates no known allergies.  Home Medications   Current Outpatient Rx  Name  Route  Sig  Dispense  Refill  . amoxicillin (AMOXIL) 500 MG capsule    Oral   Take 500 mg by mouth 4 (four) times daily - after meals and at bedtime. 28 pills take till finish         . cholecalciferol (VITAMIN D-400) 400 UNITS TABS   Oral   Take 400 Units by mouth daily.         . ferrous sulfate 325 (65 FE) MG tablet   Oral   Take 325 mg by mouth 3 (three) times daily with meals.         . folic acid (FOLVITE) 400 MCG tablet   Oral   Take 400 mcg by mouth daily.         Marland Kitchen HYDROcodone-acetaminophen (NORCO) 5-325 MG per tablet   Oral   Take 1 tablet by mouth every 4 (four) hours as needed for pain.   6 tablet   0   . ibuprofen (ADVIL,MOTRIN) 200 MG tablet   Oral   Take 800-1,200 mg by mouth 2 (two) times daily as needed for pain.         . Multiple Vitamin (MULTIVITAMIN WITH MINERALS) TABS   Oral   Take 1 tablet by mouth daily.         . vitamin B-12 (CYANOCOBALAMIN) 100 MCG tablet   Oral   Take 100 mcg by mouth daily.          BP 118/59  Pulse 76  Temp(Src) 97.9 F (36.6 C) (Oral)  Resp 20  SpO2 96%  Physical Exam  Constitutional: She is oriented to person, place, and time. She appears well-developed and well-nourished.  HENT:  Head: Normocephalic.  Neck: Normal range of motion. Neck supple.  Cardiovascular: Normal rate and regular rhythm.   Pulmonary/Chest: Effort normal and breath sounds normal.  Abdominal: Soft. Bowel sounds are normal. There is no tenderness. There is no rebound and no guarding.  Musculoskeletal: Normal range of motion.  Neurological: She is alert and oriented to person, place, and time.  Skin: Skin is warm and dry. No rash noted.  No visualized rash. Patient is scratching continuously.  Psychiatric: She has a normal mood and affect.    ED Course  Procedures (including critical care time) Labs Reviewed  POCT PREGNANCY, URINE   Results for orders placed during the hospital encounter of 03/16/13  POCT PREGNANCY, URINE      Result Value Range   Preg Test, Ur NEGATIVE  NEGATIVE    No  results found. No diagnosis found. 1. Allergic drug reaction  MDM  Patient requested a pregnancy test as she is having breast tenderness, and is late for menses. This was done and is negative. Will stop amoxil and switch to Clinda for dental infection. Treat with allergic reaction medications.   Arnoldo Hooker, PA-C 03/16/13 1014

## 2013-03-16 NOTE — ED Notes (Signed)
Pt ambulatory to bathroom

## 2013-03-16 NOTE — ED Notes (Signed)
Pt reports symptoms have improved while waiting. Leaving ambulatory to catch the bus.

## 2013-03-17 NOTE — ED Provider Notes (Signed)
Medical screening examination/treatment/procedure(s) were performed by non-physician practitioner and as supervising physician I was immediately available for consultation/collaboration.   Carleene Cooper III, MD 03/17/13 1028

## 2014-01-25 ENCOUNTER — Encounter (HOSPITAL_COMMUNITY): Payer: Self-pay | Admitting: Emergency Medicine

## 2014-01-25 DIAGNOSIS — N949 Unspecified condition associated with female genital organs and menstrual cycle: Secondary | ICD-10-CM | POA: Insufficient documentation

## 2014-01-25 LAB — URINE MICROSCOPIC-ADD ON

## 2014-01-25 LAB — URINALYSIS, ROUTINE W REFLEX MICROSCOPIC
Bilirubin Urine: NEGATIVE
GLUCOSE, UA: NEGATIVE mg/dL
HGB URINE DIPSTICK: NEGATIVE
KETONES UR: NEGATIVE mg/dL
Nitrite: NEGATIVE
PROTEIN: NEGATIVE mg/dL
Specific Gravity, Urine: 1.025 (ref 1.005–1.030)
Urobilinogen, UA: 0.2 mg/dL (ref 0.0–1.0)
pH: 5.5 (ref 5.0–8.0)

## 2014-01-25 LAB — POC URINE PREG, ED: PREG TEST UR: NEGATIVE

## 2014-01-25 NOTE — ED Notes (Addendum)
Present with vaginal pain and bleeding post-coitus with husband 2 days ago. She says, "I am bleeding from where I tore when I had stitches back in 2008 after having my chhild. There are pieces of meat hanging from that area. And it hurts there"  The bleeding on lasted one day and has stopped but the pain is still there.  Pain is worse with urination and wiping

## 2014-01-26 ENCOUNTER — Encounter (HOSPITAL_COMMUNITY): Payer: Self-pay | Admitting: Emergency Medicine

## 2014-01-26 ENCOUNTER — Emergency Department (HOSPITAL_COMMUNITY)
Admission: EM | Admit: 2014-01-26 | Discharge: 2014-01-26 | Disposition: A | Discharge status: Home / Self Care | Payer: Medicaid Other | Attending: Emergency Medicine | Admitting: Emergency Medicine

## 2014-01-26 ENCOUNTER — Emergency Department (HOSPITAL_COMMUNITY)
Admission: EM | Admit: 2014-01-26 | Discharge: 2014-01-26 | Discharge status: Against Medical Advice | Payer: Medicaid Other | Attending: Emergency Medicine | Admitting: Emergency Medicine

## 2014-01-26 DIAGNOSIS — N949 Unspecified condition associated with female genital organs and menstrual cycle: Secondary | ICD-10-CM | POA: Insufficient documentation

## 2014-01-26 DIAGNOSIS — Z8619 Personal history of other infectious and parasitic diseases: Secondary | ICD-10-CM | POA: Insufficient documentation

## 2014-01-26 DIAGNOSIS — J45909 Unspecified asthma, uncomplicated: Secondary | ICD-10-CM | POA: Insufficient documentation

## 2014-01-26 DIAGNOSIS — R102 Pelvic and perineal pain: Secondary | ICD-10-CM

## 2014-01-26 DIAGNOSIS — G8929 Other chronic pain: Secondary | ICD-10-CM | POA: Insufficient documentation

## 2014-01-26 DIAGNOSIS — Z3202 Encounter for pregnancy test, result negative: Secondary | ICD-10-CM | POA: Insufficient documentation

## 2014-01-26 DIAGNOSIS — Z862 Personal history of diseases of the blood and blood-forming organs and certain disorders involving the immune mechanism: Secondary | ICD-10-CM | POA: Insufficient documentation

## 2014-01-26 LAB — URINALYSIS, ROUTINE W REFLEX MICROSCOPIC
Bilirubin Urine: NEGATIVE
GLUCOSE, UA: NEGATIVE mg/dL
Hgb urine dipstick: NEGATIVE
KETONES UR: NEGATIVE mg/dL
LEUKOCYTES UA: NEGATIVE
Nitrite: NEGATIVE
PH: 7 (ref 5.0–8.0)
Protein, ur: NEGATIVE mg/dL
Specific Gravity, Urine: 1.022 (ref 1.005–1.030)
Urobilinogen, UA: 1 mg/dL (ref 0.0–1.0)

## 2014-01-26 LAB — WET PREP, GENITAL
CLUE CELLS WET PREP: NONE SEEN
TRICH WET PREP: NONE SEEN
Yeast Wet Prep HPF POC: NONE SEEN

## 2014-01-26 LAB — POC URINE PREG, ED: Preg Test, Ur: NEGATIVE

## 2014-01-26 MED ORDER — IBUPROFEN 600 MG PO TABS: 600.0000 mg | ORAL_TABLET | Freq: Four times a day (QID) | ORAL | Status: DC | PRN

## 2014-01-26 NOTE — ED Notes (Signed)
Pt discharged to home with family. NAD.  

## 2014-01-26 NOTE — ED Notes (Addendum)
Pt presents to ED with complaints of  vaginal pain and vaginal beeding for 3 days now since intercourse with her husband. Pt states she has "meat hanging' and thinks she tore something. Pt states she was here yesterday and had to leave due to bus schedule.

## 2014-01-26 NOTE — ED Provider Notes (Signed)
TIME SEEN: 11:05 AM  CHIEF COMPLAINT: Vaginal pain  HPI: Patient is a 28 y.o. F with history of Chlamydia, asthma, chronic back pain who presents emergency department with vaginal pain. She states she feels "2 pieces of meat hanging from a my vagina". She states that her pain started after sexual intercourse with her partner 3 days ago. She states she has had some "chunky" vaginal discharge. She's also had a foul odor. She has had some dysuria. No hematuria, urinary frequency or urgency. She started her menstrual cycle today. No abdominal pain, fever, vomiting or diarrhea.  ROS: See HPI Constitutional: no fever  Eyes: no drainage  ENT: no runny nose   Cardiovascular:  no chest pain  Resp: no SOB  GI: no vomiting GU: no dysuria Integumentary: no rash  Allergy: no hives  Musculoskeletal: no leg swelling  Neurological: no slurred speech ROS otherwise negative  PAST MEDICAL HISTORY/PAST SURGICAL HISTORY:  Past Medical History  Diagnosis Date  . Asthma   . Anemia   . Back pain, chronic     MEDICATIONS:  Prior to Admission medications   Not on File    ALLERGIES:  Allergies  Allergen Reactions  . Hydrocodone-Acetaminophen Itching    SOCIAL HISTORY:  History  Substance Use Topics  . Smoking status: Never Smoker   . Smokeless tobacco: Not on file  . Alcohol Use: Yes    FAMILY HISTORY: History reviewed. No pertinent family history.  EXAM: BP 130/60  Pulse 65  Temp(Src) 97.9 F (36.6 C) (Oral)  Resp 12  SpO2 98%  LMP 12/26/2013 CONSTITUTIONAL: Alert and oriented and responds appropriately to questions. Well-appearing; well-nourished HEAD: Normocephalic EYES: Conjunctivae clear, PERRL ENT: normal nose; no rhinorrhea; moist mucous membranes; pharynx without lesions noted NECK: Supple, no meningismus, no LAD  CARD: RRR; S1 and S2 appreciated; no murmurs, no clicks, no rubs, no gallops RESP: Normal chest excursion without splinting or tachypnea; breath sounds clear  and equal bilaterally; no wheezes, no rhonchi, no rales,  ABD/GI: Normal bowel sounds; non-distended; soft, non-tender, no rebound, no guarding GU:  Normal external genitalia with no lesions, patient has some redundant vaginal tissue at her introitus that she states is what she is feeling that is abnormal, no cervical motion tenderness, no adnexal tenderness or fullness, there is a mild amount of dark red blood coming from her cervical os; patient does have some tenderness at the 6:00 position of the vaginal introitus with no obvious laceration or lesions BACK:  The back appears normal and is non-tender to palpation, there is no CVA tenderness EXT: Normal ROM in all joints; non-tender to palpation; no edema; normal capillary refill; no cyanosis    SKIN: Normal color for age and race; warm NEURO: Moves all extremities equally PSYCH: The patient's mood and manner are appropriate. Grooming and personal hygiene are appropriate.  MEDICAL DECISION MAKING: Patient here with concerns for vaginal laceration. No laceration seen on exam. She is currently on her menstrual cycle. Have discussed with patient that her exam is reassuring in that she is likely feeling vaginal tissue that is normal. Have also discussed with patient that even if there is a small laceration that given this occurred 3 days ago, her risk of infection would be high with repair. Will obtain urinalysis, urine pregnancy test. Pelvic cultures are pending. Anticipate discharge home.  ED PROGRESS: Patient's urinalysis shows no sign of infection. Urine pregnancy test is negative. Her wet prep is unremarkable other than a few WBCs. Gonorrhea and Chlamydia are  pending but I have no reason to suspect this at this time. Suspect her pain may be secondary to an injury after sexual intercourse 3 days ago but there is no obvious large laceration. Have discussed supportive care instructions and return precautions. Have discussed with patient I recommend she  followup with her OB/GYN as an outpatient. She verbalized understanding and is comfortable with plan.     Layla Maw Redith Drach, DO 01/26/14 1244

## 2014-01-26 NOTE — Discharge Instructions (Signed)
Possible Small Healing Vaginal Laceration A vaginal laceration is a tear in the vaginal wall. A vaginal tear falls into one of three categories:  1. Obstetrically related tears that occur at the time of childbirth. 2. Trauma-related tears (most often related to sexual intercourse). 3. Spontaneous tears. Vaginal tears can cause heavy bleeding (hemorrhaging) depending on severity of the tear. Tears can be intensely tender and interfere with normal activities of living. They can make sexual intercourse painful and bring on significant burning with urination. If you have a vaginal laceration, the area around your vagina may be painful when you touch or wipe it. Even light pressure from clothing may cause some pain. Vaginal tear need to be evaluated by your caregiver.  CAUSES   Obstetric-related causes, such as childbirth.  Trauma that may result from an accident during an activity, such as sexual intercourse or a bicycle ride.  Spontaneous causes related to aging, failed healing of a past obstetric tear, chronic irritation, or skin changes that are not well understood. SYMPTOMS   Slight to heavy vaginal bleeding.  Vaginal swelling.  Mild to severe pain.  Vaginal tenderness. DIAGNOSIS  If the tear happened during childbirth, your caregiver can diagnose the tear at that time. To diagnose a vaginal tear that happened spontaneously or because of trauma, your caregiver will perform a physical exam. During the physical exam, your caregiver may also look for any signs of trouble that may need further testing. If there is hemorrhaging, your caregiver may suggest blood tests to determine the extent of bleeding. Imaging tests may be performed, such as an ultrasonography or computed tomography (CT), to look for internal damage. A biopsy may be need if there are signs of a more serious problem.  TREATMENT  Treatment depends on the severity of the tear. For minor tears that heal on their own, treatment may  only consist of keeping the area clean and dry. Some tears need to be repaired with stitches. Other tears may heal on their own with help from various remedies, such as antibiotic ointments, medicated creams, or petroleum products. Depending on the circumstances, oral hormones may also be suggested. Hormone remedies may also be in the form of topical creams and vaginal tablets. For more concerning situations, hospitalization and surgical repair of the tear may be needed. HOME CARE INSTRUCTIONS   Take warm-water baths that cover your hips and buttocks (sitz bath) 2 to 3 times a day. This may help any discomfort and swelling.   Only take over-the-counter or prescription medicines for pain, discomfort, or fever as directed by your caregiver. Do not use aspirin because it can cause increased bleeding.   Do not douche, use tampons, or have intercourse until your caregiver says it is okay.   A bandage (dressing) may have been applied. Change the dressing once a day or as directed. If the dressing sticks, soak it off with warm, soapy water.   Apply ice or witch hazel pads to the vagina to lessen any pain or discomfort.   Take a stool softener or follow a special diet as directed by your caregiver. This will help ease discomfort associated with bowel movements.  SEEK IMMEDIATE MEDICAL CARE IF:   You have redness or swelling in the vaginal area.   You have increasing, sharp, or intense pain or tenderness in the vaginal area.  You have pus or unusual discharge coming from the tear or vagina.   You notice a bad smell coming from the vagina.   Your tear  breaks open after it healed or was repaired.   You feel lightheaded.  You have increasing abdominal pain.   You have an increasing or heavy amount of vaginal bleeding.   You have pain with intercourse after the tear heals.  MAKE SURE YOU:  Understand these instructions.  Will watch your condition.  Will get help right away if  you are not doing well or get worse. Document Released: 08/16/2005 Document Revised: 05/10/2012 Document Reviewed: 01/03/2012 [facility] Patient Information 2014 [place], [place].

## 2014-01-26 NOTE — ED Notes (Signed)
Assist Dr.Ward with Pelvic.

## 2014-01-26 NOTE — ED Notes (Signed)
Pt brought back to room with children in tow; pt getting undressed and into a gown at this time; Cammy Copa, EMT and Crystal, RN at bedside

## 2014-01-26 NOTE — ED Notes (Signed)
Pt called x3 for room placement with no answer.  

## 2014-01-28 LAB — GC/CHLAMYDIA PROBE AMP
CT Probe RNA: NEGATIVE
GC Probe RNA: NEGATIVE

## 2014-07-27 ENCOUNTER — Emergency Department (HOSPITAL_COMMUNITY)
Admission: EM | Admit: 2014-07-27 | Discharge: 2014-07-28 | Disposition: A | Discharge status: Home / Self Care | Payer: Medicaid Other | Attending: Emergency Medicine | Admitting: Emergency Medicine

## 2014-07-27 ENCOUNTER — Encounter (HOSPITAL_COMMUNITY): Payer: Self-pay | Admitting: *Deleted

## 2014-07-27 DIAGNOSIS — J45909 Unspecified asthma, uncomplicated: Secondary | ICD-10-CM | POA: Diagnosis not present

## 2014-07-27 DIAGNOSIS — Z7982 Long term (current) use of aspirin: Secondary | ICD-10-CM | POA: Insufficient documentation

## 2014-07-27 DIAGNOSIS — Z3202 Encounter for pregnancy test, result negative: Secondary | ICD-10-CM | POA: Diagnosis not present

## 2014-07-27 DIAGNOSIS — L03317 Cellulitis of buttock: Secondary | ICD-10-CM | POA: Diagnosis not present

## 2014-07-27 DIAGNOSIS — Z862 Personal history of diseases of the blood and blood-forming organs and certain disorders involving the immune mechanism: Secondary | ICD-10-CM | POA: Diagnosis not present

## 2014-07-27 DIAGNOSIS — G8929 Other chronic pain: Secondary | ICD-10-CM | POA: Insufficient documentation

## 2014-07-27 DIAGNOSIS — R109 Unspecified abdominal pain: Secondary | ICD-10-CM | POA: Diagnosis present

## 2014-07-27 LAB — WET PREP, GENITAL
TRICH WET PREP: NONE SEEN
Yeast Wet Prep HPF POC: NONE SEEN

## 2014-07-27 LAB — URINALYSIS, ROUTINE W REFLEX MICROSCOPIC
Bilirubin Urine: NEGATIVE
Glucose, UA: NEGATIVE mg/dL
HGB URINE DIPSTICK: NEGATIVE
Ketones, ur: NEGATIVE mg/dL
LEUKOCYTES UA: NEGATIVE
NITRITE: NEGATIVE
Protein, ur: NEGATIVE mg/dL
SPECIFIC GRAVITY, URINE: 1.025 (ref 1.005–1.030)
UROBILINOGEN UA: 1 mg/dL (ref 0.0–1.0)
pH: 6.5 (ref 5.0–8.0)

## 2014-07-27 LAB — PREGNANCY, URINE: Preg Test, Ur: NEGATIVE

## 2014-07-27 MED ORDER — TRAMADOL HCL 50 MG PO TABS: 50.0000 mg | ORAL_TABLET | Freq: Four times a day (QID) | ORAL | Status: DC | PRN

## 2014-07-27 MED ORDER — CEPHALEXIN 500 MG PO CAPS: 500.0000 mg | ORAL_CAPSULE | Freq: Four times a day (QID) | ORAL | Status: AC

## 2014-07-27 NOTE — ED Notes (Signed)
MD and Tech at the bedside performing Pelvic Exam.

## 2014-07-27 NOTE — ED Notes (Signed)
MD at the bedside  

## 2014-07-27 NOTE — ED Notes (Signed)
Pt in c/o possible hemorrhoids, states she noticed pain to rectal area and after using the bathroom, pain increased and she noticed blood on tissue paper, pt states pain is worse with ambulation

## 2014-07-27 NOTE — ED Provider Notes (Signed)
CSN: [id number]     Arrival date & time 07/27/14  1627 History   First MD Initiated Contact with Patient 07/27/14 2128     Chief Complaint  Patient presents with  . Hemorrhoids     (Consider location/radiation/quality/duration/timing/severity/associated sxs/prior Treatment) Patient is a 28 y.o. female presenting with abdominal pain.  Abdominal Pain Pain location:  Suprapubic Pain quality: fullness, pressure and sharp   Pain quality: not aching   Pain severity:  Mild Timing:  Constant Progression:  Unchanged Chronicity:  New Context: not alcohol use, not previous surgeries and not retching   Worsened by:  Nothing tried Ineffective treatments:  None tried Associated symptoms: vaginal discharge   Associated symptoms: no anorexia, no cough, no dysuria, no fever, no flatus, no hematochezia, no hematuria, no melena, no shortness of breath, no vaginal bleeding and no vomiting     Past Medical History  Diagnosis Date  . Asthma   . Anemia   . Back pain, chronic    Past Surgical History  Procedure Laterality Date  . Mole removal    . Wisdom tooth extraction     History reviewed. No pertinent family history. History  Substance Use Topics  . Smoking status: Never Smoker   . Smokeless tobacco: Not on file  . Alcohol Use: Yes   OB History    No data available     Review of Systems  Constitutional: Negative for fever.  HENT: Negative for congestion and drooling.   Respiratory: Negative for cough and shortness of breath.   Gastrointestinal: Positive for abdominal pain and rectal pain. Negative for vomiting, melena, hematochezia, anorexia and flatus.  Genitourinary: Positive for vaginal discharge. Negative for dysuria, hematuria and vaginal bleeding.  All other systems reviewed and are negative.     Allergies  Hydrocodone-acetaminophen  Home Medications   Prior to Admission medications   Medication Sig Start Date End Date Taking? Authorizing Provider  aspirin 325 MG  tablet Take 1,300 mg by mouth 2 (two) times daily as needed for mild pain.   Yes Historical Provider, MD  cephALEXin (KEFLEX) 500 MG capsule Take 1 capsule (500 mg total) by mouth 4 (four) times daily. 07/27/14 08/01/14  Marily MemosJason Lambros Cerro, MD  ibuprofen (ADVIL,MOTRIN) 600 MG tablet Take 1 tablet (600 mg total) by mouth every 6 (six) hours as needed. Patient not taking: Reported on 07/27/2014 01/26/14   Kristen N Ward, DO  traMADol (ULTRAM) 50 MG tablet Take 1 tablet (50 mg total) by mouth every 6 (six) hours as needed for severe pain. 07/27/14   Marily MemosJason Ithan Touhey, MD   BP 119/79 mmHg  Pulse 86  Temp(Src) 98.3 F (36.8 C) (Oral)  Resp 27  Ht 6' (1.829 m)  Wt 254 lb (115.214 kg)  BMI 34.44 kg/m2  SpO2 99% Physical Exam  Constitutional: She is oriented to person, place, and time. She appears well-developed and well-nourished.  HENT:  Head: Normocephalic and atraumatic.  Eyes: Conjunctivae are normal. Pupils are equal, round, and reactive to light.  Cardiovascular: Normal rate and regular rhythm.   Pulmonary/Chest: Effort normal and breath sounds normal.  Abdominal: Soft. She exhibits no distension. There is no tenderness.  Genitourinary: Rectal exam shows no external hemorrhoid, no fissure, no mass and no tenderness.    There is no rash, tenderness or lesion on the right labia. There is no rash, tenderness or lesion on the left labia. Uterus is not deviated. Cervix exhibits no motion tenderness and no discharge. Right adnexum displays no mass and no tenderness.  Left adnexum displays no mass and no tenderness.  Musculoskeletal: Normal range of motion.  Neurological: She is alert and oriented to person, place, and time.  Skin: Skin is warm and dry.  Nursing note and vitals reviewed.   ED Course  Procedures (including critical care time) Labs Review Labs Reviewed  WET PREP, GENITAL - Abnormal; Notable for the following:    Clue Cells Wet Prep HPF POC FEW (*)    WBC, Wet Prep HPF POC RARE (*)     All other components within normal limits  GC/CHLAMYDIA PROBE AMP  URINALYSIS, ROUTINE W REFLEX MICROSCOPIC  PREGNANCY, URINE    Imaging Review No results found.   EKG Interpretation None      MDM   Final diagnoses:  Cellulitis of buttock    27 yo F w/ likely cellulitis a63round rectum. No evidence of fluid collection to suggest abscess. No pain on rectal exam to suggest internal involvement. Will tx with antibiotics and give strict return precautions for new/worsening symptoms. No e/o uti, std on pelvic exam, symptoms improved on reevaluation, possibly GI related.     Marily MemosJason Hanya Guerin, MD 07/28/14 [id number]  Derwood KaplanAnkit Nanavati, MD 07/28/14 1556

## 2014-07-28 LAB — POC OCCULT BLOOD, ED: Fecal Occult Bld: NEGATIVE

## 2014-07-28 NOTE — ED Notes (Signed)
Pt dc per MD order, stable, denies pain, denied wheelchair, pt transported to the waiting room by RN with family.

## 2014-07-30 LAB — GC/CHLAMYDIA PROBE AMP
CT PROBE, AMP APTIMA: NEGATIVE
GC PROBE AMP APTIMA: NEGATIVE

## 2014-08-19 ENCOUNTER — Ambulatory Visit: Payer: Medicaid Other | Admitting: Dietician

## 2014-12-18 ENCOUNTER — Encounter (HOSPITAL_COMMUNITY): Payer: Self-pay | Admitting: Emergency Medicine

## 2014-12-18 ENCOUNTER — Emergency Department (HOSPITAL_COMMUNITY)
Admission: EM | Admit: 2014-12-18 | Discharge: 2014-12-19 | Disposition: A | Discharge status: Home / Self Care | Payer: Medicaid Other | Attending: Emergency Medicine | Admitting: Emergency Medicine

## 2014-12-18 DIAGNOSIS — G8929 Other chronic pain: Secondary | ICD-10-CM | POA: Insufficient documentation

## 2014-12-18 DIAGNOSIS — J45909 Unspecified asthma, uncomplicated: Secondary | ICD-10-CM | POA: Insufficient documentation

## 2014-12-18 DIAGNOSIS — Z862 Personal history of diseases of the blood and blood-forming organs and certain disorders involving the immune mechanism: Secondary | ICD-10-CM | POA: Diagnosis not present

## 2014-12-18 DIAGNOSIS — K6289 Other specified diseases of anus and rectum: Secondary | ICD-10-CM | POA: Diagnosis present

## 2014-12-18 DIAGNOSIS — K625 Hemorrhage of anus and rectum: Secondary | ICD-10-CM | POA: Diagnosis not present

## 2014-12-18 NOTE — ED Provider Notes (Signed)
CSN: [id number]     Arrival date & time 12/18/14  2235 History   First MD Initiated Contact with Patient 12/18/14 2338     Chief Complaint  Patient presents with  . Rectal Pain     (Consider location/radiation/quality/duration/timing/severity/associated sxs/prior Treatment) HPI Comments: 29 year old female presents with rectal pain and bleeding, states that she has had intermittent problems with this over the last several months, has been told that she had a possible hemorrhoid donut whenever definitively saw what was causing the symptoms. She denies any other symptoms including abdominal pain nausea vomiting fevers chills back pain chest pain coughing shortness of breath vaginal discharge or dysuria. She denies any rectal foreign body or trauma, states that she has increased pain when she sits on her bottom when the symptoms happened, states that she has had hard stools recently and has noticed some bright red blood on the stools with the pain when she passes them.  The history is provided by the patient.    Past Medical History  Diagnosis Date  . Asthma   . Anemia   . Back pain, chronic    Past Surgical History  Procedure Laterality Date  . Mole removal    . Wisdom tooth extraction     No family history on file. History  Substance Use Topics  . Smoking status: Never Smoker   . Smokeless tobacco: Not on file  . Alcohol Use: Yes   OB History    No data available     Review of Systems  All other systems reviewed and are negative.     Allergies  Hydrocodone-acetaminophen  Home Medications   Prior to Admission medications   Medication Sig Start Date End Date Taking? Authorizing Provider  DM-Phenylephrine-Acetaminophen 20-10-650 MG PACK Take 1 Package by mouth every 6 (six) hours as needed (dissolve one packet in warm water for cold and flu symptom relief).   Yes Historical Provider, MD  Naproxen Sodium (ALEVE PO) Take 1-2 tablets by mouth 2 (two) times daily as needed  (bodyaches).   Yes Historical Provider, MD  Phenylephrine-DM-GG-APAP 5-10-200-325 MG/15ML LIQD Take 30 mLs by mouth every 6 (six) hours as needed (cold and allergy symptoms).   Yes Historical Provider, MD  ibuprofen (ADVIL,MOTRIN) 600 MG tablet Take 1 tablet (600 mg total) by mouth every 6 (six) hours as needed. Patient not taking: Reported on 07/27/2014 01/26/14   Kristen N Ward, DO  polyethylene glycol powder (GLYCOLAX/MIRALAX) powder Take 17 g by mouth 2 (two) times daily. Until daily soft stools  OTC 12/19/14   Francee PiccoloJennifer Piepenbrink, PA-C  traMADol (ULTRAM) 50 MG tablet Take 1 tablet (50 mg total) by mouth every 6 (six) hours as needed for severe pain. Patient not taking: Reported on 12/19/2014 07/27/14   Marily MemosJason Mesner, MD   BP 121/70 mmHg  Pulse 71  Temp(Src) 98 F (36.7 C) (Oral)  Resp 16  Ht 5\' 10"  (1.778 m)  Wt 263 lb (119.296 kg)  BMI 37.74 kg/m2  SpO2 100%  LMP 12/07/2014 Physical Exam  Constitutional: She appears well-developed and well-nourished. No distress.  HENT:  Head: Normocephalic and atraumatic.  Mouth/Throat: Oropharynx is clear and moist. No oropharyngeal exudate.  Eyes: Conjunctivae and EOM are normal. Pupils are equal, round, and reactive to light. Right eye exhibits no discharge. Left eye exhibits no discharge. No scleral icterus.  Neck: Normal range of motion. Neck supple. No JVD present. No thyromegaly present.  Cardiovascular: Normal rate, regular rhythm, normal heart sounds and intact distal pulses.  Exam reveals no gallop and no friction rub.   No murmur heard. Pulmonary/Chest: Effort normal and breath sounds normal. No respiratory distress. She has no wheezes. She has no rales.  Abdominal: Soft. Bowel sounds are normal. She exhibits no distension and no mass. There is no tenderness.  Genitourinary:  Chaperone present for rectal exam, normal-appearing external anus and rectum, no obvious hemorrhoids, no fissures, tenderness at the 3:00 and 9:00 position with  slight swelling, no fluctuance, no redness, no obvious discrete masses, no gross blood, no stool in the rectal vault  Musculoskeletal: Normal range of motion. She exhibits no edema or tenderness.  Lymphadenopathy:    She has no cervical adenopathy.  Neurological: She is alert. Coordination normal.  Skin: Skin is warm and dry. No rash noted. No erythema.  Psychiatric: She has a normal mood and affect. Her behavior is normal.  Nursing note and vitals reviewed.   ED Course  Procedures (including critical care time) Labs Review Labs Reviewed  I-STAT CHEM 8, ED - Abnormal; Notable for the following:    Hemoglobin 10.9 (*)    HCT 32.0 (*)    All other components within normal limits  URINALYSIS, ROUTINE W REFLEX MICROSCOPIC  POC OCCULT BLOOD, ED  POC URINE PREG, ED    Imaging Review Ct Pelvis W Contrast  12/19/2014   CLINICAL DATA:  Pain and bleeding from the rectum with bowel movements.  EXAM: CT PELVIS WITH CONTRAST  TECHNIQUE: Multidetector CT imaging of the pelvis was performed using the standard protocol following the bolus administration of intravenous contrast.  CONTRAST:  100 cc Omnipaque 300 intravenous  COMPARISON:  None.  FINDINGS: The visualized bowel is normal, including the appendix. Physiologic appearance of the uterus and ovaries, with corpus luteum on the left. No lymphadenopathy in the pelvis.  No rectal or anal mural thickening to explain the history. There is no evidence of perianal or perirectal abscess. No evidence of hemorrhoid.  IMPRESSION: Negative pelvis CT.   Electronically Signed   By: Marnee Spring M.D.   On: 12/19/2014 01:33      MDM   Final diagnoses:  Rectal bleeding    Vital signs normal, check creatinine, check hemoglobin, CT scan to rule out perirectal abscess though more likely to be hemorrhoid, no fissure.  CT ordered, at change of shift, care signed out to Dr. Littie Deeds and Ms Silvestre Moment - they will f/u on CT results and disposition  appropriately.    Eber Hong, MD 12/19/14 [phone]

## 2014-12-18 NOTE — ED Notes (Signed)
Pt c/o pain and bleeding from rectum when she has a BM. Reports difficult BM's.

## 2014-12-18 NOTE — ED Notes (Addendum)
Pt wheeled back to room, pt changing into gown, call bell within reach, RN aware of pt in room

## 2014-12-19 ENCOUNTER — Emergency Department (HOSPITAL_COMMUNITY): Payer: Medicaid Other

## 2014-12-19 ENCOUNTER — Encounter (HOSPITAL_COMMUNITY): Payer: Self-pay | Admitting: Radiology

## 2014-12-19 LAB — URINALYSIS, ROUTINE W REFLEX MICROSCOPIC
BILIRUBIN URINE: NEGATIVE
GLUCOSE, UA: NEGATIVE mg/dL
Hgb urine dipstick: NEGATIVE
KETONES UR: NEGATIVE mg/dL
LEUKOCYTES UA: NEGATIVE
Nitrite: NEGATIVE
PH: 6 (ref 5.0–8.0)
Protein, ur: NEGATIVE mg/dL
Specific Gravity, Urine: 1.023 (ref 1.005–1.030)
Urobilinogen, UA: 1 mg/dL (ref 0.0–1.0)

## 2014-12-19 LAB — I-STAT CHEM 8, ED
BUN: 13 mg/dL (ref 6–23)
CHLORIDE: 101 mmol/L (ref 96–112)
Calcium, Ion: 1.2 mmol/L (ref 1.12–1.23)
Creatinine, Ser: 0.9 mg/dL (ref 0.50–1.10)
Glucose, Bld: 98 mg/dL (ref 70–99)
HEMATOCRIT: 32 % — AB (ref 36.0–46.0)
Hemoglobin: 10.9 g/dL — ABNORMAL LOW (ref 12.0–15.0)
POTASSIUM: 3.9 mmol/L (ref 3.5–5.1)
Sodium: 139 mmol/L (ref 135–145)
TCO2: 24 mmol/L (ref 0–100)

## 2014-12-19 LAB — POC URINE PREG, ED: PREG TEST UR: NEGATIVE

## 2014-12-19 LAB — POC OCCULT BLOOD, ED: FECAL OCCULT BLD: NEGATIVE

## 2014-12-19 MED ORDER — POLYETHYLENE GLYCOL 3350 17 GM/SCOOP PO POWD: 17.0000 g | Freq: Two times a day (BID) | ORAL | Status: DC

## 2014-12-19 MED ORDER — IOHEXOL 300 MG/ML  SOLN
100.0000 mL | Freq: Once | INTRAMUSCULAR | Status: AC | PRN
  Administered 2014-12-19: 100 mL via INTRAVENOUS

## 2014-12-19 NOTE — ED Notes (Signed)
Explained plan of care with patient, urine sample needed. No urge to void at this time.

## 2014-12-19 NOTE — ED Provider Notes (Signed)
Patient care acquired From Dr. Hyacinth MeekerMiller pending CT pelvis results with plan to DC home if normal with GI follow up and good return precautions.   Results for orders placed or performed during the hospital encounter of 12/18/14  Urinalysis, Routine w reflex microscopic  Result Value Ref Range   Color, Urine YELLOW YELLOW   APPearance CLEAR CLEAR   Specific Gravity, Urine 1.023 1.005 - 1.030   pH 6.0 5.0 - 8.0   Glucose, UA NEGATIVE NEGATIVE mg/dL   Hgb urine dipstick NEGATIVE NEGATIVE   Bilirubin Urine NEGATIVE NEGATIVE   Ketones, ur NEGATIVE NEGATIVE mg/dL   Protein, ur NEGATIVE NEGATIVE mg/dL   Urobilinogen, UA 1.0 0.0 - 1.0 mg/dL   Nitrite NEGATIVE NEGATIVE   Leukocytes, UA NEGATIVE NEGATIVE  POC occult blood, ED Provider will collect  Result Value Ref Range   Fecal Occult Bld NEGATIVE NEGATIVE  I-stat chem 8, ed  Result Value Ref Range   Sodium 139 135 - 145 mmol/L   Potassium 3.9 3.5 - 5.1 mmol/L   Chloride 101 96 - 112 mmol/L   BUN 13 6 - 23 mg/dL   Creatinine, Ser 4.090.90 0.50 - 1.10 mg/dL   Glucose, Bld 98 70 - 99 mg/dL   Calcium, Ion 8.111.20 1.12 - 1.23 mmol/L   TCO2 24 0 - 100 mmol/L   Hemoglobin 10.9 (L) 12.0 - 15.0 g/dL   HCT 91.432.0 (L) 78.236.0 - 95.646.0 %  P[facility] urine preg[facility]  Result Value Ref Range   Preg Test, Ur NEGATIVE NEGATIVE   Ct Pelvis W Contrast  12/19/2014   CLINICAL DATA:  Pain and bleeding from the rectum with bowel movements.  EXAM: CT PELVIS WITH CONTRAST  TECHNIQUE: Multidetector CT imaging of the pelvis was performed using the standard protocol following the bolus administration of intravenous contrast.  CONTRAST:  100 cc Omnipaque 300 intravenous  COMPARISON:  None.  FINDINGS: The visualized bowel is normal, including the appendix. Physiologic appearance of the uterus and ovaries, with corpus luteum on the left. No lymphadenopathy in the pelvis.  No rectal or anal mural thickening to explain the history. There is no evidence of perianal or perirectal  abscess. No evidence of hemorrhoid.  IMPRESSION: Negative pelvis CT.   Electronically Signed   By: Marnee SpringJonathon  Watts M.D.   On: 12/19/2014 01:33    1. Rectal bleeding    Discussed results with patient. Return precautions discussed. PCP and gastroenterology follow-up advised. Will place patient on MiraLAX. Patient is agreeable to plan. Patient is stable at time of discharge. Patient d/w with Dr. Hyacinth MeekerMiller, agrees with plan.       Francee PiccoloJennifer Amaziah Raisanen, PA-C 12/19/14 0341  Eber HongBrian Miller, MD 12/19/14 [phone]

## 2014-12-19 NOTE — ED Notes (Signed)
Called CT to report pregnancy test negative, [facility].

## 2014-12-19 NOTE — Discharge Instructions (Signed)
Please follow up with your primary care physician in 1-2 days. If you do not have one please call the [facility] number listed above. Please follow up with Dr. Elnoria HowardHung to schedule a follow up appointment.  Please read all discharge instructions and return precautions.    Bloody Stools Bloody stools often mean that there is a problem in the digestive tract. Your caregiver may use the term "melena" to describe black, tarry, and bad smelling stools or "hematochezia" to describe red or maroon-colored stools. Blood seen in the stool can be caused by bleeding anywhere along the intestinal tract.  A black stool usually means that blood is coming from the upper part of the gastrointestinal tract (esophagus, stomach, or small bowel). Passing maroon-colored stools or bright red blood usually means that blood is coming from lower down in the large bowel or the rectum. However, sometimes massive bleeding in the stomach or small intestine can cause bright red bloody stools.  Consuming black licorice, lead, iron pills, medicines containing bismuth subsalicylate, or blueberries can also cause black stools. Your caregiver can test black stools to see if blood is present. It is important that the cause of the bleeding be found. Treatment can then be started, and the problem can be corrected. Rectal bleeding may not be serious, but you should not assume everything is okay until you know the cause.It is very important to follow up with your caregiver or a specialist in gastrointestinal problems. CAUSES  Blood in the stools can come from various underlying causes.Often, the cause is not found during your first visit. Testing is often needed to discover the cause of bleeding in the gastrointestinal tract. Causes range from simple to serious or even life-threatening.Possible causes include:  Hemorrhoids.These are veins that are full of blood (engorged) in the rectum. They cause pain, inflammation, and  may bleed.  Anal fissures.These are areas of painful tearing which may bleed. They are often caused by passing hard stool.  Diverticulosis.These are pouches that form on the colon over time, with age, and may bleed significantly.  Diverticulitis.This is inflammation in areas with diverticulosis. It can cause pain, fever, and bloody stools, although bleeding is rare.  Proctitis and colitis. These are inflamed areas of the rectum or colon. They may cause pain, fever, and bloody stools.  Polyps and cancer. Colon cancer is a leading cause of preventable cancer death.It often starts out as precancerous polyps that can be removed during a colonoscopy, preventing progression into cancer. Sometimes, polyps and cancer may cause rectal bleeding.  Gastritis and ulcers.Bleeding from the upper gastrointestinal tract (near the stomach) may travel through the intestines and produce black, sometimes tarry, often bad smelling stools. In certain cases, if the bleeding is fast enough, the stools may not be black, but red and the condition may be life-threatening. SYMPTOMS  You may have stools that are bright red and bloody, that are normal color with blood on them, or that are dark black and tarry. In some cases, you may only have blood in the toilet bowl. Any of these cases need medical care. You may also have:  Pain at the anus or anywhere in the rectum.  Lightheadedness or feeling faint.  Extreme weakness.  Nausea or vomiting.  Fever. DIAGNOSIS Your caregiver may use the following methods to find the cause of your bleeding:  Taking a medical history. Age is important. Older people tend to develop polyps and cancer more often. If there is anal pain and a hard,  large stool associated with bleeding, a tear of the anus may be the cause. If blood drips into the toilet after a bowel movement, bleeding hemorrhoids may be the problem. The color and frequency of the bleeding are additional considerations. In  most cases, the medical history provides clues, but seldom the final answer.  A visual and finger (digital) exam. Your caregiver will inspect the anal area, looking for tears and hemorrhoids. A finger exam can provide information when there is tenderness or a growth inside. In men, the prostate is also examined.  Endoscopy. Several types of small, long scopes (endoscopes) are used to view the colon.  In the office, your caregiver may use a rigid, or more commonly, a flexible viewing sigmoidoscope. This exam is called flexible sigmoidoscopy. It is performed in 5 to 10 minutes.  A more thorough exam is accomplished with a colonoscope. It allows your caregiver to view the entire 5 to 6 foot long colon. Medicine to help you relax (sedative) is usually given for this exam. Frequently, a bleeding lesion may be present beyond the reach of the sigmoidoscope. So, a colonoscopy may be the best exam to start with. Both exams are usually done on an outpatient basis. This means the patient does not stay overnight in the hospital or surgery center.  An upper endoscopy may be needed to examine your stomach. Sedation is used and a flexible endoscope is put in your mouth, down to your stomach.  A barium enema X-ray. This is an X-ray exam. It uses liquid barium inserted by enema into the rectum. This test alone may not identify an actual bleeding point. X-rays highlight abnormal shadows, such as those made by lumps (tumors), diverticuli, or colitis. TREATMENT  Treatment depends on the cause of your bleeding.   For bleeding from the stomach or colon, the caregiver doing your endoscopy or colonoscopy may be able to stop the bleeding as part of the procedure.  Inflammation or infection of the colon can be treated with medicines.  Many rectal problems can be treated with creams, suppositories, or warm baths.  Surgery is sometimes needed.  Blood transfusions are sometimes needed if you have lost a lot of  blood.  For any bleeding problem, let your caregiver know if you take aspirin or other blood thinners regularly. HOME CARE INSTRUCTIONS   Take any medicines exactly as prescribed.  Keep your stools soft by eating a diet high in fiber. Prunes (1 to 3 a day) work well for many people.  Drink enough water and fluids to keep your urine clear or pale yellow.  Take sitz baths if advised. A sitz bath is when you sit in a bathtub with warm water for 10 to 15 minutes to soak, soothe, and cleanse the rectal area.  If enemas or suppositories are advised, be sure you know how to use them. Tell your caregiver if you have problems with this.  Monitor your bowel movements to look for signs of improvement or worsening. SEEK MEDICAL CARE IF:   You do not improve in the time expected.  Your condition worsens after initial improvement.  You develop any new symptoms. SEEK IMMEDIATE MEDICAL CARE IF:   You develop severe or prolonged rectal bleeding.  You vomit blood.  You feel weak or faint.  You have a fever. MAKE SURE YOU:  Understand these instructions.  Will watch your condition.  Will get help right away if you are not doing well or get worse. Document Released: 08/06/2002 Document Revised:  11/08/2011 Document Reviewed: 01/01/2011 ExitCare Patient Information 2015 [place], [place]. This information is not intended to replace advice given to you by your health care provider. Make sure you discuss any questions you have with your health care provider.

## 2014-12-19 NOTE — ED Notes (Signed)
Patient asked to drink and some graham crackers. Explained NPO status until testing has been completed. Patient denies urge to void at this time.

## 2015-01-28 ENCOUNTER — Encounter (HOSPITAL_COMMUNITY): Payer: Self-pay | Admitting: Emergency Medicine

## 2015-01-28 ENCOUNTER — Emergency Department (HOSPITAL_COMMUNITY)
Admission: EM | Admit: 2015-01-28 | Discharge: 2015-01-28 | Disposition: A | Discharge status: Home / Self Care | Payer: Medicaid Other | Attending: Emergency Medicine | Admitting: Emergency Medicine

## 2015-01-28 DIAGNOSIS — S00551A Superficial foreign body of lip, initial encounter: Secondary | ICD-10-CM | POA: Diagnosis not present

## 2015-01-28 DIAGNOSIS — Y9389 Activity, other specified: Secondary | ICD-10-CM | POA: Insufficient documentation

## 2015-01-28 DIAGNOSIS — Y999 Unspecified external cause status: Secondary | ICD-10-CM | POA: Diagnosis not present

## 2015-01-28 DIAGNOSIS — Z79899 Other long term (current) drug therapy: Secondary | ICD-10-CM | POA: Insufficient documentation

## 2015-01-28 DIAGNOSIS — X58XXXA Exposure to other specified factors, initial encounter: Secondary | ICD-10-CM | POA: Diagnosis not present

## 2015-01-28 DIAGNOSIS — J45909 Unspecified asthma, uncomplicated: Secondary | ICD-10-CM | POA: Diagnosis not present

## 2015-01-28 DIAGNOSIS — G8929 Other chronic pain: Secondary | ICD-10-CM | POA: Diagnosis not present

## 2015-01-28 DIAGNOSIS — Z862 Personal history of diseases of the blood and blood-forming organs and certain disorders involving the immune mechanism: Secondary | ICD-10-CM | POA: Insufficient documentation

## 2015-01-28 DIAGNOSIS — Y929 Unspecified place or not applicable: Secondary | ICD-10-CM | POA: Diagnosis not present

## 2015-01-28 MED ORDER — LIDOCAINE HCL (PF) 1 % IJ SOLN
5.0000 mL | Freq: Once | INTRAMUSCULAR | Status: AC
  Administered 2015-01-28: 5 mL via INTRADERMAL
  Filled 2015-01-28: qty 5

## 2015-01-28 MED ORDER — SULFAMETHOXAZOLE-TRIMETHOPRIM 800-160 MG PO TABS: 1.0000 | ORAL_TABLET | Freq: Two times a day (BID) | ORAL | Status: AC

## 2015-01-28 MED ORDER — ACETAMINOPHEN 500 MG PO TABS
1000.0000 mg | ORAL_TABLET | Freq: Once | ORAL | Status: AC
  Administered 2015-01-28: 1000 mg via ORAL
  Filled 2015-01-28: qty 2

## 2015-01-28 MED ORDER — ACETAMINOPHEN ER 650 MG PO TBCR: 650.0000 mg | EXTENDED_RELEASE_TABLET | Freq: Three times a day (TID) | ORAL | Status: DC | PRN

## 2015-01-28 NOTE — ED Provider Notes (Signed)
CSN: [id number]     Arrival date & time 01/28/15  1043 History  This chart was scribed for non-physician practitioner, Emilia BeckKaitlyn Shauntel Prest, PA-C, working with Rolland PorterMark James, MD, by Lionel DecemberHatice Demirci, ED Scribe. This patient was seen in room TR05C/TR05C and the patient's care was started at 11:06 AM.    Chief Complaint  Patient presents with  . Oral Swelling     (Consider location/radiation/quality/duration/timing/severity/associated sxs/prior Treatment) HPI  HPI Comments: Diane Rice is a 29 y.o. female who presents to the Emergency Department for the removal of the ball of her lip ring which has been in the left side of her lip for the past three days. Patient has tenderness to touch.  Patient was trying to remove the lip ring this morning because she thought it was getting infected but was not able to do so due to the pain.  She has no other complaints today.    Past Medical History  Diagnosis Date  . Asthma   . Anemia   . Back pain, chronic    Past Surgical History  Procedure Laterality Date  . Mole removal    . Wisdom tooth extraction     No family history on file. History  Substance Use Topics  . Smoking status: Never Smoker   . Smokeless tobacco: Not on file  . Alcohol Use: Yes   OB History    No data available     Review of Systems  Skin: Positive for wound.  All other systems reviewed and are negative.     Allergies  Hydrocodone-acetaminophen  Home Medications   Prior to Admission medications   Medication Sig Start Date End Date Taking? Authorizing Provider  DM-Phenylephrine-Acetaminophen 20-10-650 MG PACK Take 1 Package by mouth every 6 (six) hours as needed (dissolve one packet in warm water for cold and flu symptom relief).    Historical Provider, MD  ibuprofen (ADVIL,MOTRIN) 600 MG tablet Take 1 tablet (600 mg total) by mouth every 6 (six) hours as needed. Patient not taking: Reported on 07/27/2014 01/26/14   Kristen N Ward, DO  Naproxen Sodium (ALEVE  PO) Take 1-2 tablets by mouth 2 (two) times daily as needed (bodyaches).    Historical Provider, MD  Phenylephrine-DM-GG-APAP 5-10-200-325 MG/15ML LIQD Take 30 mLs by mouth every 6 (six) hours as needed (cold and allergy symptoms).    Historical Provider, MD  polyethylene glycol powder (GLYCOLAX/MIRALAX) powder Take 17 g by mouth 2 (two) times daily. Until daily soft stools  OTC 12/19/14   Francee PiccoloJennifer Piepenbrink, PA-C  traMADol (ULTRAM) 50 MG tablet Take 1 tablet (50 mg total) by mouth every 6 (six) hours as needed for severe pain. Patient not taking: Reported on 12/19/2014 07/27/14   Marily MemosJason Mesner, MD   BP 130/77 mmHg  Pulse 66  Temp(Src) 98.8 F (37.1 C) (Oral)  Resp 12  Ht 5\' 11"  (1.803 m)  Wt 271 lb (122.925 kg)  BMI 37.81 kg/m2  SpO2 99%  LMP 01/26/2015 Physical Exam  Constitutional: She is oriented to person, place, and time. She appears well-developed and well-nourished. No distress.  HENT:  Head: Normocephalic and atraumatic.  Localized swelling and tenderness to palpation of the left lower lip.   Eyes: Conjunctivae and EOM are normal. Pupils are equal, round, and reactive to light.  Neck: Normal range of motion.  Cardiovascular: Normal rate.   Pulmonary/Chest: Effort normal. No respiratory distress.  Abdominal: Soft. She exhibits no distension. There is no tenderness. There is no rebound.  Musculoskeletal: Normal range of motion.  Neurological: She is alert and oriented to person, place, and time. Coordination normal.  Skin: Skin is warm and dry.  Psychiatric: She has a normal mood and affect. Her behavior is normal.  Nursing note and vitals reviewed.   ED Course  FOREIGN BODY REMOVAL Date/Time: 01/28/2015 11:56 AM Performed by: Emilia Beck Authorized by: Emilia Beck Consent: Verbal consent obtained. Consent given by: patient Patient understanding: patient states understanding of the procedure being performed Patient consent: the patient's understanding of  the procedure matches consent given Procedure consent: procedure consent matches procedure scheduled Patient identity confirmed: verbally with patient Time out: Immediately prior to procedure a "time out" was called to verify the correct patient, procedure, equipment, support staff and site/side marked as required. Body area: skin General location: head/neck Location details: mouth Anesthesia: local infiltration Local anesthetic: lidocaine 1% without epinephrine Anesthetic total: 1.5 ml Patient sedated: no Patient restrained: no Patient cooperative: yes Localization method: visualized Removal mechanism: scalpel Tendon involvement: none Depth: deep Complexity: complex 1 objects recovered. Objects recovered: metal ball of earring Post-procedure assessment: foreign body removed Patient tolerance: Patient tolerated the procedure well with no immediate complications   (including critical care time) DIAGNOSTIC STUDIES: Oxygen Saturation is 99% on RA, normal by my interpretation.    COORDINATION OF CARE: 11:10 AM Discussed treatment plan with patient at beside, the patient agrees with the plan and has no further questions at this time.   Labs Review Labs Reviewed - No data to display  Imaging Review No results found.   EKG Interpretation None      MDM   Final diagnoses:  Foreign body in lip, initial encounter   I was able to remove the foreign body and will discharge the patient with bactrim for infection. Vitals stable and patient afebrile.   I personally performed the services described in this documentation, which was scribed in my presence. The recorded information has been reviewed and is accurate.    Emilia Beck, PA-C 01/28/15 1324  Rolland Porter, MD 02/05/15 [phone]

## 2015-01-28 NOTE — ED Notes (Addendum)
States was trying to remove her lip ring this am because it seemed to be getting infected, "the Haji Delaine got stuck" in her lip. Left lower lip swollen.

## 2015-01-28 NOTE — Discharge Instructions (Signed)
Take bactrim as directed until gone. Take tylenol as needed for pain. Refer to attached documents for more information.

## 2015-01-28 NOTE — ED Notes (Signed)
Pt is in stable condition upon d/c and ambulates from ED. 

## 2015-06-21 ENCOUNTER — Encounter (HOSPITAL_COMMUNITY): Payer: Self-pay | Admitting: Nurse Practitioner

## 2015-06-21 ENCOUNTER — Emergency Department (HOSPITAL_COMMUNITY)
Admission: EM | Admit: 2015-06-21 | Discharge: 2015-06-22 | Disposition: A | Discharge status: Home / Self Care | Payer: Medicaid Other | Attending: Emergency Medicine | Admitting: Emergency Medicine

## 2015-06-21 DIAGNOSIS — R531 Weakness: Secondary | ICD-10-CM | POA: Diagnosis not present

## 2015-06-21 DIAGNOSIS — M79604 Pain in right leg: Secondary | ICD-10-CM | POA: Diagnosis present

## 2015-06-21 DIAGNOSIS — Z862 Personal history of diseases of the blood and blood-forming organs and certain disorders involving the immune mechanism: Secondary | ICD-10-CM | POA: Diagnosis not present

## 2015-06-21 DIAGNOSIS — R0789 Other chest pain: Secondary | ICD-10-CM | POA: Insufficient documentation

## 2015-06-21 DIAGNOSIS — J45909 Unspecified asthma, uncomplicated: Secondary | ICD-10-CM | POA: Diagnosis not present

## 2015-06-21 DIAGNOSIS — R252 Cramp and spasm: Secondary | ICD-10-CM | POA: Diagnosis not present

## 2015-06-21 DIAGNOSIS — G8929 Other chronic pain: Secondary | ICD-10-CM | POA: Diagnosis not present

## 2015-06-21 DIAGNOSIS — Z79899 Other long term (current) drug therapy: Secondary | ICD-10-CM | POA: Insufficient documentation

## 2015-06-21 DIAGNOSIS — R42 Dizziness and giddiness: Secondary | ICD-10-CM | POA: Diagnosis not present

## 2015-06-21 LAB — CBC WITH DIFFERENTIAL/PLATELET
Basophils Absolute: 0 10*3/uL (ref 0.0–0.1)
Basophils Relative: 0 %
EOS PCT: 1 %
Eosinophils Absolute: 0.1 10*3/uL (ref 0.0–0.7)
HCT: 28.3 % — ABNORMAL LOW (ref 36.0–46.0)
Hemoglobin: 8.9 g/dL — ABNORMAL LOW (ref 12.0–15.0)
LYMPHS ABS: 2 10*3/uL (ref 0.7–4.0)
LYMPHS PCT: 32 %
MCH: 24.5 pg — ABNORMAL LOW (ref 26.0–34.0)
MCHC: 31.4 g/dL (ref 30.0–36.0)
MCV: 78 fL (ref 78.0–100.0)
MONO ABS: 0.5 10*3/uL (ref 0.1–1.0)
Monocytes Relative: 7 %
Neutro Abs: 3.7 10*3/uL (ref 1.7–7.7)
Neutrophils Relative %: 60 %
PLATELETS: 389 10*3/uL (ref 150–400)
RBC: 3.63 MIL/uL — ABNORMAL LOW (ref 3.87–5.11)
RDW: 18 % — AB (ref 11.5–15.5)
WBC: 6.3 10*3/uL (ref 4.0–10.5)

## 2015-06-21 NOTE — ED Notes (Signed)
Pt endorses right ankle pain that has been intermittently for the past few months. Patient states pain moves up right leg and is sometimes in left leg. Patient is ambulatory with no assistance and steady gait. Patient denies leg pain at present time. Patient denies fall & injury.
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
## 2015-06-21 NOTE — ED Provider Notes (Signed)
MSE was initiated and I personally evaluated the patient and placed orders (if any) at  11:15 PM on June 21, 2015.   Diane Rice is a 29 y.o. female with a PMHx of anemia, who presents to the ED with complaints of multiple episodes of lightheadedness and leg cramping today. She reports that she almost "fell out" today, and she's worried that she will pass out at home with her children and no one will know.  Given her complaints, acuity changed and pt moved.    The patient appears stable so that the remainder of the MSE may be completed by another provider.  Jadakiss Barish Camprubi-Soms, PA-C 06/21/15 2316  Tomasita CrumbleAdeleke Oni, MD 06/22/15 [id number]

## 2015-06-22 ENCOUNTER — Emergency Department (HOSPITAL_COMMUNITY): Payer: Medicaid Other

## 2015-06-22 ENCOUNTER — Encounter (HOSPITAL_COMMUNITY): Payer: Self-pay | Admitting: Radiology

## 2015-06-22 ENCOUNTER — Ambulatory Visit (HOSPITAL_COMMUNITY): Admission: RE | Admit: 2015-06-22 | Payer: No Typology Code available for payment source | Source: Ambulatory Visit

## 2015-06-22 LAB — BASIC METABOLIC PANEL
ANION GAP: 5 (ref 5–15)
BUN: 9 mg/dL (ref 6–20)
CHLORIDE: 106 mmol/L (ref 101–111)
CO2: 26 mmol/L (ref 22–32)
Calcium: 9.3 mg/dL (ref 8.9–10.3)
Creatinine, Ser: 1.03 mg/dL — ABNORMAL HIGH (ref 0.44–1.00)
GFR calc Af Amer: >60 mL/min (ref >60)
GFR calc non Af Amer: >60 mL/min (ref >60)
GLUCOSE: 121 mg/dL — AB (ref 65–99)
POTASSIUM: 4.6 mmol/L (ref 3.5–5.1)
Sodium: 137 mmol/L (ref 135–145)

## 2015-06-22 LAB — I-STAT CG4 LACTIC ACID, ED: Lactic Acid, Venous: 2.02 mmol/L (ref 0.5–2.0)

## 2015-06-22 LAB — CK: CK TOTAL: 250 U/L — AB (ref 38–234)

## 2015-06-22 LAB — I-STAT BETA HCG BLOOD, ED (MC, WL, AP ONLY): I-stat hCG, quantitative: <5 m[IU]/mL (ref <5)

## 2015-06-22 MED ORDER — LORAZEPAM 2 MG/ML IJ SOLN: 1.0000 mg | Freq: Once | INTRAMUSCULAR | Status: DC

## 2015-06-22 MED ORDER — IOHEXOL 350 MG/ML SOLN
80.0000 mL | Freq: Once | INTRAVENOUS | Status: AC | PRN
  Administered 2015-06-22: 80 mL via INTRAVENOUS

## 2015-06-22 MED ORDER — ENOXAPARIN SODIUM 150 MG/ML ~~LOC~~ SOLN
1.0000 mg/kg | Freq: Once | SUBCUTANEOUS | Status: AC
  Administered 2015-06-22: 125 mg via SUBCUTANEOUS
  Filled 2015-06-22: qty 1

## 2015-06-22 NOTE — Discharge Instructions (Signed)
Dizziness Diane Rice, the CT scan does not show any blood clots. Come back tomorrow for Doppler ultrasound of your legs for evaluation. See your primary care physician within 3 days for close follow-up. If any symptoms worsen come back to emergency department immediately. Thank you. Dizziness is a common problem. It makes you feel unsteady or lightheaded. You may feel like you are about to pass out (faint). Dizziness can lead to injury if you stumble or fall. Anyone can get dizzy, but dizziness is more common in older adults. This condition can be caused by a number of things, including:  Medicines.  Dehydration.  Illness. HOME CARE Following these instructions may help with your condition: Eating and Drinking  Drink enough fluid to keep your pee (urine) clear or pale yellow. This helps to keep you from getting dehydrated. Try to drink more clear fluids, such as water.  Do not drink alcohol.  Limit how much caffeine you drink or eat if told by your doctor.  Limit how much salt you drink or eat if told by your doctor. Activity  Avoid making quick movements.  When you stand up from sitting in a chair, steady yourself until you feel okay.  In the morning, first sit up on the side of the bed. When you feel okay, stand slowly while you hold onto something. Do this until you know that your balance is fine.  Move your legs often if you need to stand in one place for a long time. Tighten and relax your muscles in your legs while you are standing.  Do not drive or use heavy machinery if you feel dizzy.  Avoid bending down if you feel dizzy. Place items in your home so that they are easy for you to reach without leaning over. Lifestyle  Do not use any tobacco products, including cigarettes, chewing tobacco, or electronic cigarettes. If you need help quitting, ask your doctor.  Try to lower your stress level, such as with yoga or meditation. Talk with your doctor if you need  help. General Instructions  Watch your dizziness for any changes.  Take medicines only as told by your doctor. Talk with your doctor if you think that your dizziness is caused by a medicine that you are taking.  Tell a friend or a family member that you are feeling dizzy. If he or she notices any changes in your behavior, have this person call your doctor.  Keep all follow-up visits as told by your doctor. This is important. GET HELP IF:  Your dizziness does not go away.  Your dizziness or light-headedness gets worse.  You feel sick to your stomach (nauseous).  You have trouble hearing.  You have new symptoms.  You are unsteady on your feet or you feel like the room is spinning. GET HELP RIGHT AWAY IF:  You throw up (vomit) or have diarrhea and are unable to eat or drink anything.  You have trouble:  Talking.  Walking.  Swallowing.  Using your arms, hands, or legs.  You feel generally weak.  You are not thinking clearly or you have trouble forming sentences. It may take a friend or family member to notice this.  You have:  Chest pain.  Pain in your belly (abdomen).  Shortness of breath.  Sweating.  Your vision changes.  You are bleeding.  You have a headache.  You have neck pain or a stiff neck.  You have a fever.   This information is not intended to replace  advice given to you by your health care provider. Make sure you discuss any questions you have with your health care provider.   Document Released: 08/05/2011 Document Revised: 12/31/2014 Document Reviewed: 08/12/2014 Elsevier Interactive Patient Education 2016 Else[employer] Inc. Muscle Pain, Adult Muscle pain (myalgia) may be caused by many things, including:  Overuse or muscle strain, especially if you are not in shape. This is the most common cause of muscle pain.  Injury.  Bruises.  Viruses, such as the flu.  Infectious diseases.  Fibromyalgia, which is a chronic condition that causes  muscle tenderness, fatigue, and headache.  Autoimmune diseases, including lupus.  Certain drugs, including ACE inhibitors and statins. Muscle pain may be mild or severe. In most cases, the pain lasts only a short time and goes away without treatment. To diagnose the cause of your muscle pain, your health care provider will take your medical history. This means he or she will ask you when your muscle pain began and what has been happening. If you have not had muscle pain for very long, your health care provider may want to wait before doing much testing. If your muscle pain has lasted a long time, your health care provider may want to run tests right away. If your health care provider thinks your muscle pain may be caused by illness, you may need to have additional tests to rule out certain conditions.  Treatment for muscle pain depends on the cause. Home care is often enough to relieve muscle pain. Your health care provider may also prescribe anti-inflammatory medicine. HOME CARE INSTRUCTIONS Watch your condition for any changes. The following actions may help to lessen any discomfort you are feeling:  Only take over-the-counter or prescription medicines as directed by your health care provider.  Apply ice to the sore muscle:  Put ice in a plastic bag.  Place a towel between your skin and the bag.  Leave the ice on for 15-20 minutes, 3-4 times a day.  You may alternate applying hot and cold packs to the muscle as directed by your health care provider.  If overuse is causing your muscle pain, slow down your activities until the pain goes away.  Remember that it is normal to feel some muscle pain after starting a workout program. Muscles that have not been used often will be sore at first.  Do regular, gentle exercises if you are not usually active.  Warm up before exercising to lower your risk of muscle pain.  Do not continue working out if the pain is very bad. Bad pain could mean you  have injured a muscle. SEEK MEDICAL CARE IF:  Your muscle pain gets worse, and medicines do not help.  You have muscle pain that lasts longer than 3 days.  You have a rash or fever along with muscle pain.  You have muscle pain after a tick bite.  You have muscle pain while working out, even though you are in good physical condition.  You have redness, soreness, or swelling along with muscle pain.  You have muscle pain after starting a new medicine or changing the dose of a medicine. SEEK IMMEDIATE MEDICAL CARE IF:  You have trouble breathing.  You have trouble swallowing.  You have muscle pain along with a stiff neck, fever, and vomiting.  You have severe muscle weakness or cannot move part of your body. MAKE SURE YOU:   Understand these instructions.  Will watch your condition.  Will get help right away if you are  not doing well or get worse.   This information is not intended to replace advice given to you by your health care provider. Make sure you discuss any questions you have with your health care provider.   Document Released: 07/08/2006 Document Revised: 09/06/2014 Document Reviewed: 06/12/2013 Elsevier Interactive Patient Education [employer] Elsevier Inc.

## 2015-06-22 NOTE — ED Notes (Signed)
Pt back from ct. On the way back from ct pt began having a right inner thigh cramp.

## 2015-06-22 NOTE — ED Notes (Signed)
Pt verbalized understanding to come back at 0800 for vascular study. Pt stable and NAD.

## 2015-12-30 ENCOUNTER — Encounter: Payer: Medicaid Other | Attending: Internal Medicine | Admitting: Dietician

## 2015-12-30 DIAGNOSIS — E669 Obesity, unspecified: Secondary | ICD-10-CM | POA: Insufficient documentation

## 2015-12-30 NOTE — Progress Notes (Signed)
  Medical Nutrition Therapy:  Appt start time: 1030 end time:  1145   Assessment:  Primary concerns today: Diane Rice is here today referred for obesity. She was recently on phentermine and topomax for weight loss but she states her prescription ran out and she cannot afford it. 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 Has an extensive history of dieting and regaining weight. Has tried crash dieting and weight loss pills. She lives with her husband and 2 children. She does the cooking and she and her husband share the grocery shopping. She is not currently working. She likes to play basketball and dance.    Goal weight: 230 lbs  Preferred Learning Style:   No preference indicated   Learning Readiness:   Ready   MEDICATIONS: see list   DIETARY INTAKE:  Usual eating pattern includes 3 meals and 3 snacks per day. Does not tolerate lactose well.   24-hr recall:  B (9-10 AM): 2 "big bowls" of cereal (captain crunch or frosted flakes)  Snk (11 AM): chips and candy bars  L ( PM): leftovers from the night before Snk ( PM): see morning snack D ( PM): orange chicken OR fried chicken and potato salad and baked beans Snk ( PM): candy bars or granola bars  Frequently wakes up in the night to eat (cheese sandwich or cup o' noodles)  Beverages: "a lot of juice," coffee with cream and sugar, sometimes soda   Usual physical activity: inconsistent  Estimated energy needs: 1600-1800 calories 180-200 g carbohydrates 120-135 g protein 44-50 g fat  Progress Towards Goal(s):  In progress.   Nutritional Diagnosis:  [place]-3.3 Overweight/obesity As related to extensive dieting history, excessive energy intake, physical inactivity, and inappropriate food/beverage choices.  As evidenced by BMI 36.5.    Intervention:  Nutrition counseling provided. 1. See your counselor to focus on emotional eating and self esteem -Increase physical activity as tolerated  -Walking outside, walking DVD, basketball,  zumba -Work on reframing your perspective (no more "dieting") -Practice eating slowly and mindfully (wait a few minutes before getting seconds) -Remember the 80/20 rule! -Reward your progress with non-food treats  -Do not eat less than 1200 calories per day -Keep taking a list to the store -AVOID BUYING TRIGGER FOODS -Make dessert a special occasion instead of having it in the house -Make healthier choices readily available   -Pre-portioned grapes  -Carrots and other cut-up vegetables   -Yogurt  -String cheese  -Boiled eggs  -Fresh fruit -Limit juice and soda (eat fresh fruit instead) -Try using Premier protein shake for coffee creamer -Start taking your iron and vitamin D supplements Goal weight: 230 lbs  -Set small, realistic goals and remember that healthy weight loss is 1-2 pounds per week  -Get excited and be proud of small victories  Teaching Method Utilized:  Visual Auditory Hands on  Handouts given during visit include:  MyPlate  Meal planning card  Barriers to learning/adherence to lifestyle change: none  Demonstrated degree of understanding via:  Teach Back   Monitoring/Evaluation:  Dietary intake, exercise, and body weight in 1 month(s).

## 2015-12-30 NOTE — Patient Instructions (Addendum)
  1. See your counselor to focus on emotional eating and self esteem  -Increase physical activity as tolerated  -Walking outside, walking DVD, basketball, zumba  -Work on reframing your perspective (no more "dieting")  -Practice eating slowly and mindfully (wait a few minutes before getting seconds)  -Remember the 80/20 rule!  -Reward your progress with non-food treats   -Do not eat less than 1200 calories per day  -Keep taking a list to the store -AVOID BUYING TRIGGER FOODS -Make dessert a special occasion instead of having it in the house  -Make healthier choices readily available   -Pre-portioned grapes  -Carrots and other cut-up vegetables   -Yogurt  -String cheese  -Boiled eggs  -Fresh fruit  -Limit juice and soda (eat fresh fruit instead)  -Try using Premier protein shake for coffee creamer  -Start taking your iron and vitamin D supplements   Goal weight: 230 lbs  -Set small, realistic goals and remember that healthy weight loss is 1-2 pounds per week  -Get excited and be proud of small victories

## 2015-12-31 ENCOUNTER — Encounter: Payer: Self-pay | Admitting: Dietician

## 2016-01-07 ENCOUNTER — Encounter: Payer: Self-pay | Admitting: Internal Medicine

## 2016-01-22 ENCOUNTER — Emergency Department (HOSPITAL_COMMUNITY)
Admission: EM | Admit: 2016-01-22 | Discharge: 2016-01-22 | Disposition: A | Discharge status: Home / Self Care | Payer: No Typology Code available for payment source | Attending: Emergency Medicine | Admitting: Emergency Medicine

## 2016-01-22 ENCOUNTER — Encounter (HOSPITAL_COMMUNITY): Payer: Self-pay | Admitting: Emergency Medicine

## 2016-01-22 DIAGNOSIS — J45901 Unspecified asthma with (acute) exacerbation: Secondary | ICD-10-CM | POA: Insufficient documentation

## 2016-01-22 DIAGNOSIS — D500 Iron deficiency anemia secondary to blood loss (chronic): Secondary | ICD-10-CM | POA: Insufficient documentation

## 2016-01-22 DIAGNOSIS — Z791 Long term (current) use of non-steroidal anti-inflammatories (NSAID): Secondary | ICD-10-CM | POA: Insufficient documentation

## 2016-01-22 DIAGNOSIS — Z79899 Other long term (current) drug therapy: Secondary | ICD-10-CM | POA: Insufficient documentation

## 2016-01-22 DIAGNOSIS — H53149 Visual discomfort, unspecified: Secondary | ICD-10-CM | POA: Insufficient documentation

## 2016-01-22 DIAGNOSIS — R519 Headache, unspecified: Secondary | ICD-10-CM

## 2016-01-22 DIAGNOSIS — R51 Headache: Secondary | ICD-10-CM | POA: Insufficient documentation

## 2016-01-22 DIAGNOSIS — G8929 Other chronic pain: Secondary | ICD-10-CM | POA: Insufficient documentation

## 2016-01-22 LAB — CBC WITH DIFFERENTIAL/PLATELET
Basophils Absolute: 0 10*3/uL (ref 0.0–0.1)
Basophils Relative: 0 %
Eosinophils Absolute: 0.1 10*3/uL (ref 0.0–0.7)
Eosinophils Relative: 1 %
HCT: 27.1 % — ABNORMAL LOW (ref 36.0–46.0)
Hemoglobin: 8.1 g/dL — ABNORMAL LOW (ref 12.0–15.0)
Lymphocytes Relative: 30 %
Lymphs Abs: 1.9 10*3/uL (ref 0.7–4.0)
MCH: 22.1 pg — ABNORMAL LOW (ref 26.0–34.0)
MCHC: 29.9 g/dL — ABNORMAL LOW (ref 30.0–36.0)
MCV: 74 fL — ABNORMAL LOW (ref 78.0–100.0)
Monocytes Absolute: 0.3 10*3/uL (ref 0.1–1.0)
Monocytes Relative: 5 %
Neutro Abs: 4.1 10*3/uL (ref 1.7–7.7)
Neutrophils Relative %: 64 %
Platelets: 340 10*3/uL (ref 150–400)
RBC: 3.66 MIL/uL — ABNORMAL LOW (ref 3.87–5.11)
RDW: 19.2 % — ABNORMAL HIGH (ref 11.5–15.5)
WBC: 6.1 10*3/uL (ref 4.0–10.5)

## 2016-01-22 LAB — URINE MICROSCOPIC-ADD ON

## 2016-01-22 LAB — PREGNANCY, URINE: Preg Test, Ur: NEGATIVE

## 2016-01-22 LAB — BASIC METABOLIC PANEL
Anion gap: 8 (ref 5–15)
BUN: 9 mg/dL (ref 6–20)
CO2: 23 mmol/L (ref 22–32)
Calcium: 9.2 mg/dL (ref 8.9–10.3)
Chloride: 105 mmol/L (ref 101–111)
Creatinine, Ser: 0.92 mg/dL (ref 0.44–1.00)
GFR calc Af Amer: >60 mL/min (ref >60)
GFR calc non Af Amer: >60 mL/min (ref >60)
Glucose, Bld: 101 mg/dL — ABNORMAL HIGH (ref 65–99)
Potassium: 3.8 mmol/L (ref 3.5–5.1)
Sodium: 136 mmol/L (ref 135–145)

## 2016-01-22 LAB — URINALYSIS, ROUTINE W REFLEX MICROSCOPIC
Bilirubin Urine: NEGATIVE
Glucose, UA: NEGATIVE mg/dL
Ketones, ur: NEGATIVE mg/dL
Leukocytes, UA: NEGATIVE
Nitrite: NEGATIVE
Protein, ur: NEGATIVE mg/dL
Specific Gravity, Urine: 1.012 (ref 1.005–1.030)
pH: 7.5 (ref 5.0–8.0)

## 2016-01-22 LAB — BRAIN NATRIURETIC PEPTIDE: B Natriuretic Peptide: 49.1 pg/mL (ref 0.0–100.0)

## 2016-01-22 MED ORDER — KETOROLAC TROMETHAMINE 30 MG/ML IJ SOLN
30.0000 mg | Freq: Once | INTRAMUSCULAR | Status: AC
  Administered 2016-01-22: 30 mg via INTRAVENOUS
  Filled 2016-01-22: qty 1

## 2016-01-22 MED ORDER — FERROUS SULFATE 325 (65 FE) MG PO TABS: 325.0000 mg | ORAL_TABLET | Freq: Every day | ORAL | Status: DC

## 2016-01-22 MED ORDER — PROCHLORPERAZINE EDISYLATE 5 MG/ML IJ SOLN
5.0000 mg | Freq: Once | INTRAMUSCULAR | Status: DC
Start: 2016-01-22 — End: 2016-01-22
  Administered 2016-01-22: 5 mg via INTRAVENOUS
  Filled 2016-01-22: qty 2

## 2016-01-22 MED ORDER — DIPHENHYDRAMINE HCL 50 MG/ML IJ SOLN
25.0000 mg | Freq: Once | INTRAMUSCULAR | Status: AC
  Administered 2016-01-22: 25 mg via INTRAVENOUS
  Filled 2016-01-22: qty 1

## 2016-01-22 MED ORDER — SODIUM CHLORIDE 0.9 % IV BOLUS (SEPSIS)
1000.0000 mL | Freq: Once | INTRAVENOUS | Status: AC
  Administered 2016-01-22: 1000 mL via INTRAVENOUS

## 2016-01-22 NOTE — ED Notes (Signed)
Pt reports she twists her dreadlocks herself, last done x 1 month ago; reports clear liquid from various areas to scalp that is at times, yellow with odor.

## 2016-01-22 NOTE — Discharge Instructions (Signed)
Medications: Ferrous sulfate  Treatment: Take ferrous sulfate once daily for 1 month for your anemia. You can try over-the-counter medications like ibuprofen, Aleve, Excedrin Migraine for your headaches in the future. Avoid known triggers to your headaches. Try a different hairstyle to see if it reduces your scalp pain and symptoms.  Follow-up: Please follow-up with Dr. Margo Aye for further evaluation of your scalp symptoms. Please follow-up with community health and wellness, or call the number circled on your discharge paperwork, to establish care with a primary care provider for further evaluation and treatment. Please return to the emergency department if you develop any new or worsening symptoms.   Migraine Headache A migraine headache is an intense, throbbing pain on one or both sides of your head. A migraine can last for 30 minutes to several hours. CAUSES  The exact cause of a migraine headache is not always known. However, a migraine may be caused when nerves in the brain become irritated and release chemicals that cause inflammation. This causes pain. Certain things may also trigger migraines, such as:  Alcohol.  Smoking.  Stress.  Menstruation.  Aged cheeses.  Foods or drinks that contain nitrates, glutamate, aspartame, or tyramine.  Lack of sleep.  Chocolate.  Caffeine.  Hunger.  Physical exertion.  Fatigue.  Medicines used to treat chest pain (nitroglycerine), birth control pills, estrogen, and some blood pressure medicines. SIGNS AND SYMPTOMS  Pain on one or both sides of your head.  Pulsating or throbbing pain.  Severe pain that prevents daily activities.  Pain that is aggravated by any physical activity.  Nausea, vomiting, or both.  Dizziness.  Pain with exposure to bright lights, loud noises, or activity.  General sensitivity to bright lights, loud noises, or smells. Before you get a migraine, you may get warning signs that a migraine is coming  (aura). An aura may include:  Seeing flashing lights.  Seeing bright spots, halos, or zigzag lines.  Having tunnel vision or blurred vision.  Having feelings of numbness or tingling.  Having trouble talking.  Having muscle weakness. DIAGNOSIS  A migraine headache is often diagnosed based on:  Symptoms.  Physical exam.  A CT scan or MRI of your head. These imaging tests cannot diagnose migraines, but they can help rule out other causes of headaches. TREATMENT Medicines may be given for pain and nausea. Medicines can also be given to help prevent recurrent migraines.  HOME CARE INSTRUCTIONS  Only take over-the-counter or prescription medicines for pain or discomfort as directed by your health care provider. The use of long-term narcotics is not recommended.  Lie down in a dark, quiet room when you have a migraine.  Keep a journal to find out what may trigger your migraine headaches. For example, write down:  What you eat and drink.  How much sleep you get.  Any change to your diet or medicines.  Limit alcohol consumption.  Quit smoking if you smoke.  Get 7-9 hours of sleep, or as recommended by your health care provider.  Limit stress.  Keep lights dim if bright lights bother you and make your migraines worse. SEEK IMMEDIATE MEDICAL CARE IF:   Your migraine becomes severe.  You have a fever.  You have a stiff neck.  You have vision loss.  You have muscular weakness or loss of muscle control.  You start losing your balance or have trouble walking.  You feel faint or pass out.  You have severe symptoms that are different from your first symptoms. MAKE  SURE YOU:   Understand these instructions.  Will watch your condition.  Will get help right away if you are not doing well or get worse.   This information is not intended to replace advice given to you by your health care provider. Make sure you discuss any questions you have with your health care  provider.   Document Released: 08/16/2005 Document Revised: 09/06/2014 Document Reviewed: 04/23/2013 Elsevier Interactive Patient Education 2016 [employer].  Iron Deficiency Anemia, Adult Anemia is a condition in which there are less red blood cells or hemoglobin in the blood than normal. Hemoglobin is the part of red blood cells that carries oxygen. Iron deficiency anemia is anemia caused by too little iron. It is the most common type of anemia. It may leave you tired and short of breath. CAUSES   Lack of iron in the diet.  Poor absorption of iron, as seen with intestinal disorders.  Intestinal bleeding.  Heavy periods. SIGNS AND SYMPTOMS  Mild anemia may not be noticeable. Symptoms may include:  Fatigue.  Headache.  Pale skin.  Weakness.  Tiredness.  Shortness of breath.  Dizziness.  Cold hands and feet.  Fast or irregular heartbeat. DIAGNOSIS  Diagnosis requires a thorough evaluation and physical exam by your health care provider. Blood tests are generally used to confirm iron deficiency anemia. Additional tests may be done to find the underlying cause of your anemia. These may include:  Testing for blood in the stool (fecal occult blood test).  A procedure to see inside the colon and rectum (colonoscopy).  A procedure to see inside the esophagus and stomach (endoscopy). TREATMENT  Iron deficiency anemia is treated by correcting the cause of the deficiency. Treatment may involve:  Adding iron-rich foods to your diet.  Taking iron supplements. Pregnant or breastfeeding women need to take extra iron because their normal diet usually does not provide the required amount.  Taking vitamins. Vitamin C improves the absorption of iron. Your health care provider may recommend that you take your iron tablets with a glass of orange juice or vitamin C supplement.  Medicines to make heavy menstrual flow lighter.  Surgery. HOME CARE INSTRUCTIONS   Take iron as  directed by your health care provider.  If you cannot tolerate taking iron supplements by mouth, talk to your health care provider about taking them through a vein (intravenously) or an injection into a muscle.  For the best iron absorption, iron supplements should be taken on an empty stomach. If you cannot tolerate them on an empty stomach, you may need to take them with food.  Do not drink milk or take antacids at the same time as your iron supplements. Milk and antacids may interfere with the absorption of iron.  Iron supplements can cause constipation. Make sure to include fiber in your diet to prevent constipation. A stool softener may also be recommended.  Take vitamins as directed by your health care provider.  Eat a diet rich in iron. Foods high in iron include liver, lean beef, whole-grain bread, eggs, dried fruit, and dark green leafy vegetables. SEEK IMMEDIATE MEDICAL CARE IF:   You faint. If this happens, do not drive. Call your local emergency services (911 in [place] if no other help is available.  You have chest pain.  You feel nauseous or vomit.  You have severe or increased shortness of breath with activity.  You feel weak.  You have a rapid heartbeat.  You have unexplained sweating.  You become light-headed when  getting up from a chair or bed. MAKE SURE YOU:   Understand these instructions.  Will watch your condition.  Will get help right away if you are not doing well or get worse.   This information is not intended to replace advice given to you by your health care provider. Make sure you discuss any questions you have with your health care provider.   Document Released: 08/13/2000 Document Revised: 09/06/2014 Document Reviewed: 04/23/2013 Elsevier Interactive Patient Education [employer] Else[employer] Inc[employer]

## 2016-01-22 NOTE — ED Provider Notes (Signed)
Patient complains of right-sided parietal headache going to right eye onset 2 days ago typical migraine headache she's had in the past. She also reports intermittent drainage from the right side of her scalp for several months. He is asymptomatic since treatment in the emergency department on exam patient is alert and in no distress HEENT exam no facial asymmetry scalp with no lesion no drainage no redness and nontender neurologic cranial nerves II through XII intact alert oriented 3 Glasgow Coma Score 15 moves all extremities well  Doug SouSam Tanazia Achee, MD 01/23/16 [phone]

## 2016-01-22 NOTE — ED Notes (Signed)
Patient complains of migraines on and off the several weeks that each lasting sometimes up to 2-3 days.  Patient ambulated to restroom independently without difficulty.  Patient in no apparent distress at this time.

## 2016-01-22 NOTE — ED Provider Notes (Signed)
CSN: [id number]     Arrival date & time 01/22/16  1040 History   First MD Initiated Contact with Patient 01/22/16 1053     Chief Complaint  Patient presents with  . Headache     (Consider location/radiation/quality/duration/timing/severity/associated sxs/prior Treatment) HPI Comments: Patient is a 30 year old female history of asthma who presents with a 6 week history of intermittent headaches and scalp pain. Patient states she has a history of migraines, but only when she was pregnant. 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 Patient states her headaches are localized to the right side of her head, from the back of her head to her face. Patient describes her pain as achy and rates her pain as a 6/10. Patient states her headaches tend to last 2-3 days and come back within 1 week. Patient states she has right-sided eye pain associated with her headaches and sees a "brown floater." Patient has associated photophobia and phonophobia with her headaches. She denies nausea, vomiting. Patient also states she has been smelling a strange smell constantly, which she thought may have been her hair. Patient has noticed scabbing and drainage to the right side of her scalp. She states she has never had dandruff. Patient reports that she had an episode of "fluttering" in her heart last night that kept her awake. She also reports that she has had shortness of breath with exertion. Patient denies fall or injury.  Patient is a 30 y.o. female presenting with headaches. The history is provided by the patient.  Headache Associated symptoms: eye pain and photophobia   Associated symptoms: no abdominal pain, no back pain, no fever, no nausea, no sore throat and no vomiting     Past Medical History  Diagnosis Date  . Asthma   . Anemia   . Back pain, chronic    Past Surgical History  Procedure Laterality Date  . Mole removal    . Wisdom  tooth extraction     No family history on file. Social History  Substance Use Topics  . Smoking status: Never Smoker   . Smokeless tobacco: None  . Alcohol Use: Yes   OB History    No data available     Review of Systems  Constitutional: Negative for fever and chills.  HENT: Negative for facial swelling and sore throat.   Eyes: Positive for photophobia, pain and visual disturbance.  Respiratory: Positive for shortness of breath (on exertion).   Cardiovascular: Negative for chest pain.  Gastrointestinal: Negative for nausea, vomiting and abdominal pain.  Genitourinary: Negative for dysuria.  Musculoskeletal: Negative for back pain.  Skin: Negative for rash and wound.  Neurological: Positive for headaches.  Psychiatric/Behavioral: The patient is not nervous/anxious.       Allergies  Hydrocodone-acetaminophen  Home Medications   Prior to Admission medications   Medication Sig Start Date End Date Taking? Authorizing Provider  acetaminophen (TYLENOL 8 HOUR) 650 MG CR tablet Take 1 tablet (650 mg total) by mouth every 8 (eight) hours as needed for pain. 01/28/15  Yes Kaitlyn Szekalski, PA-C  ibuprofen (ADVIL,MOTRIN) 200 MG tablet Take 200 mg by mouth every 6 (six) hours as needed.   Yes Historical Provider, MD  naproxen sodium (ALEVE) 220 MG tablet Take 220 mg by mouth 2 (two) times daily with a meal.   Yes Historical Provider, MD  ferrous sulfate 325 (65 FE) MG tablet Take 1 tablet (325 mg total) by  mouth daily with breakfast. 01/22/16 02/22/16  Fardowsa Authier M Jahniyah Revere, PA-C   BP 109/62 mmHg  Pulse 54  Temp(Src) 97.8 F (36.6 C) (Oral)  Resp 14  SpO2 100% Physical Exam  Constitutional: She appears well-developed and well-nourished. No distress.  HENT:  Head: Normocephalic and atraumatic.  Mouth/Throat: Oropharynx is clear and moist. No oropharyngeal exudate.  Eyes: Conjunctivae and EOM are normal. Pupils are equal, round, and reactive to light. Right eye exhibits no discharge.  Left eye exhibits no discharge. No scleral icterus.  Neck: Normal range of motion. Neck supple. No thyromegaly present.  Cardiovascular: Normal rate, regular rhythm, normal heart sounds and intact distal pulses.  Exam reveals no gallop and no friction rub.   No murmur heard. Pulmonary/Chest: Effort normal and breath sounds normal. No stridor. No respiratory distress. She has no wheezes. She has no rales.  Abdominal: Soft. Bowel sounds are normal. She exhibits no distension. There is no tenderness. There is no rebound and no guarding.  Musculoskeletal: She exhibits no edema.  Lymphadenopathy:    She has no cervical adenopathy.  Neurological: She is alert. Coordination normal.  CN 3-12 intact; normal sensation throughout; 5/5 strength in all 4 extremities; equal bilateral grip strength  Skin: Skin is warm and dry. No rash noted. She is not diaphoretic. No pallor.  Psychiatric: She has a normal mood and affect.  Nursing note and vitals reviewed.   ED Course  Procedures (including critical care time) Labs Review Labs Reviewed  BASIC METABOLIC PANEL - Abnormal; Notable for the following:    Glucose, Bld 101 (*)    All other components within normal limits  CBC WITH DIFFERENTIAL/PLATELET - Abnormal; Notable for the following:    RBC 3.66 (*)    Hemoglobin 8.1 (*)    HCT 27.1 (*)    MCV 74.0 (*)    MCH 22.1 (*)    MCHC 29.9 (*)    RDW 19.2 (*)    All other components within normal limits  URINALYSIS, ROUTINE W REFLEX MICROSCOPIC (NOT AT [facility]) - Abnormal; Notable for the following:    Hgb urine dipstick MODERATE (*)    All other components within normal limits  URINE MICROSCOPIC-ADD ON - Abnormal; Notable for the following:    Squamous Epithelial / LPF 0-5 (*)    Bacteria, UA FEW (*)    All other components within normal limits  PREGNANCY, URINE  BRAIN NATRIURETIC PEPTIDE    Imaging Review No results found. I have personally reviewed and evaluated these images and lab results as  part of my medical decision-making.   EKG Interpretation   Date/Time:  Thursday Jan 22 2016 11:36:45 EDT Ventricular Rate:  53 PR Interval:  173 QRS Duration: 89 QT Interval:  431 QTC Calculation: 405 R Axis:   31 Text Interpretation:  Sinus rhythm No significant change since last  tracing Confirmed by Ethelda ChickJACUBOWITZ  MD, SAM [phone]([id number]) on 01/22/2016 12:01:12 PM      MDM   BMP unremarkable. CBC shows chronic anemia. BNP 49.1. UA shows moderate hematuria, patient is finishing menstrual cycle. Urine pregnancy negative. EKG shows NSR. Patient never experienced "heart fluttering" sensation throughout ED course. Pt HA treated and resolved while in ED.  Presentation is like pts typical HA and non concerning [facility], [facility], Meningitis, or temporal arteritis. Pt is afebrile with no focal neuro deficits, nuchal rigidity. Pt is to follow up with PCP to discuss prophylactic medication. Patient advised to change her hairstyle to improve scalp pain and tenderness. Patient  also given follow-up to dermatology. Due to patient's anemia that she states is due to heavy menstrual periods, I will discharge patient with ferrous sulfate. I recommend trial of over-the-counter medications for treatment of the patient's headaches. Pt verbalizes understanding and is agreeable with plan to discharge. Patient also evaluated by Dr. Ethelda Chick who is in agreement with plan. Patient vitals stable throughout ED course and discharged in satisfactory condition.   Final diagnoses:  Acute nonintractable headache, unspecified headache type  Iron deficiency anemia due to chronic blood loss      Emi Holes, PA-C 01/22/16 1511  Doug Sou, MD 01/23/16 [phone]

## 2016-02-02 ENCOUNTER — Ambulatory Visit: Payer: Medicaid Other | Admitting: Dietician

## 2016-12-04 ENCOUNTER — Encounter (HOSPITAL_COMMUNITY): Payer: Self-pay | Admitting: Emergency Medicine

## 2016-12-04 ENCOUNTER — Emergency Department (HOSPITAL_COMMUNITY)
Admission: EM | Admit: 2016-12-04 | Discharge: 2016-12-04 | Disposition: A | Discharge status: Home / Self Care | Payer: Self-pay | Attending: Emergency Medicine | Admitting: Emergency Medicine

## 2016-12-04 DIAGNOSIS — K648 Other hemorrhoids: Secondary | ICD-10-CM | POA: Insufficient documentation

## 2016-12-04 DIAGNOSIS — J45909 Unspecified asthma, uncomplicated: Secondary | ICD-10-CM | POA: Insufficient documentation

## 2016-12-04 DIAGNOSIS — Z79899 Other long term (current) drug therapy: Secondary | ICD-10-CM | POA: Insufficient documentation

## 2016-12-04 DIAGNOSIS — I10 Essential (primary) hypertension: Secondary | ICD-10-CM | POA: Insufficient documentation

## 2016-12-04 HISTORY — DX: Essential (primary) hypertension: I10

## 2016-12-04 LAB — POC OCCULT BLOOD, ED: Fecal Occult Bld: NEGATIVE

## 2016-12-04 MED ORDER — HYDROCORTISONE ACETATE 25 MG RE SUPP: 25.0000 mg | Freq: Two times a day (BID) | RECTAL | 0 refills | Status: DC

## 2016-12-04 MED ORDER — DOCUSATE SODIUM 100 MG PO CAPS: 100.0000 mg | ORAL_CAPSULE | Freq: Two times a day (BID) | ORAL | 0 refills | Status: DC

## 2016-12-04 NOTE — ED Triage Notes (Signed)
Pt c/o pain in rectal area onset yesterday. Pt reports history of same. Pain increases with bowel movement and upon standing.

## 2016-12-04 NOTE — ED Notes (Signed)
Pt is in stable condition upon d/c and ambulates from ED. 

## 2016-12-04 NOTE — ED Provider Notes (Signed)
[facility]EM[facility]Y DEPT Provider Note   CSN: [id number] Arrival date & time: 12/04/16  4098     History   Chief Complaint Chief Complaint  Patient presents with  . Rectal Pain    HPI Diane Rice is a 31 y.o. female.  HPI Patient periodically gets rectal pain and was told she had a "tag". She reports the pain however is on the opposite side of this tag. She reports it is exacerbated by activity. She reports she thinks it might be because she took a mile long walk yesterday. She reports that there was a "blue thing" bulging out. Patient poor she has normal bowel movements. She denies having to strain at stool. Pain is worse by standing position. No noted rectal bleeding. No associated abdominal pain. No associated GU symptoms. Past Medical History:  Diagnosis Date  . Anemia   . Asthma   . Back pain, chronic   . Hypertension     Patient Active Problem List   Diagnosis Date Noted  . OBESITY 03/19/2010  . ANEMIA, MILD 03/19/2010  . BACK PAIN 03/19/2010  . VITAMIN D DEFICIENCY 01/01/2010  . 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 DEPRESSION 12/30/2009  . CHRONIC PAIN SYNDROME 12/30/2009  . VIRAL URI 12/30/2009  . ASTHMA 12/30/2009    Past Surgical History:  Procedure Laterality Date  . MOLE REMOVAL    . WISDOM TOOTH EXTRACTION      OB History    No data available       Home Medications    Prior to Admission medications   Medication Sig Start Date End Date Taking? Authorizing Provider  acetaminophen (TYLENOL 8 HOUR) 650 MG CR tablet Take 1 tablet (650 mg total) by mouth every 8 (eight) hours as needed for pain. 01/28/15   Emilia Beck, PA-C  ferrous sulfate 325 (65 FE) MG tablet Take 1 tablet (325 mg total) by mouth daily with breakfast. 01/22/16 02/22/16  Waylan Boga Law, PA-C  ibuprofen (ADVIL,MOTRIN) 200 MG tablet Take 200 mg by mouth every 6 (six) hours as needed.    Historical Provider, MD  naproxen sodium (ALEVE) 220 MG tablet Take 220 mg by mouth 2 (two)  times daily with a meal.    Historical Provider, MD    Family History No family history on file.  Social History Social History  Substance Use Topics  . Smoking status: Never Smoker  . Smokeless tobacco: Never Used  . Alcohol use Yes     Allergies   Hydrocodone-acetaminophen   Review of Systems Review of Systems 10 Systems reviewed and are negative for acute change except as noted in the HPI.   Physical Exam Updated Vital Signs BP (!) 95/58   Pulse (!) 51   Temp 98.7 F (37.1 C) (Oral)   Resp 20   Ht 6' (1.829 m)   Wt 276 lb (125.2 kg)   LMP 12/03/2016   SpO2 100%   BMI 37.43 kg/m   Physical Exam  Constitutional: She is oriented to person, place, and time.  Alert, nontoxic, well in appearance. Morbid obesity.  Eyes: Conjunctivae and EOM are normal.  Pulmonary/Chest: Effort normal.  Abdominal: Soft. Bowel sounds are normal. She exhibits no distension and no mass. There is no tenderness. There is no guarding.  Bilateral inguinal canals free of mass or tenderness.  Genitourinary:  Genitourinary Comments: Visual examination of the perianal area reveals normal anal tissues without erythema or swelling. Skin condition of buttocks and gluteal cleft is excellent. Small 12:00 nonthrombosed hemorrhoidal skin tag.  5 mm, soft. Digital examination is tender but no masses in the rectal vault. No stool. No blood. Tenderness is in the 6:00 position. Palpation does not suggest any abscess. About a 1 cm soft ridge consistent with internal hemorrhoid.  Musculoskeletal: Normal range of motion.  Neurological: She is alert and oriented to person, place, and time. No cranial nerve deficit. She exhibits normal muscle tone. Coordination normal.  Skin: Skin is warm and dry.  Psychiatric: She has a normal mood and affect.     ED Treatments / Results  Labs (all labs ordered are listed, but only abnormal results are displayed) Labs Reviewed  POC OCCULT BLOOD, ED    EKG  EKG  Interpretation None       Radiology No results found.  Procedures Procedures (including critical care time)  Medications Ordered in ED Medications - No data to display   Initial Impression / Assessment and Plan / ED Course  I have reviewed the triage vital signs and the nursing notes.  Pertinent labs & imaging results that were available during my care of the patient were reviewed by me and considered in my medical decision making (see chart for details).     Final Clinical Impressions(s) / ED Diagnoses   Final diagnoses:  Internal hemorrhoid  Patient is very well in appearance. Symptoms are consistent with hemorrhoid was increased prominence with straining and walking. No evidence of anal abscess or candidiasis. Perianal tissue is in excellent condition. No associated abdominal pain or other associated symptoms. Patient is counseled on management for hemorrhoids and follow-up for anoscopy or sigmoidoscopy if symptoms are worsening or persistent. Patient has PCP for follow-up and is aware of the plan.  New Prescriptions New Prescriptions   No medications on file     Arby Barrette, MD 12/04/16 [phone]

## 2016-12-29 DIAGNOSIS — J45909 Unspecified asthma, uncomplicated: Secondary | ICD-10-CM | POA: Insufficient documentation

## 2016-12-29 DIAGNOSIS — R51 Headache: Secondary | ICD-10-CM | POA: Insufficient documentation

## 2016-12-29 DIAGNOSIS — Z79899 Other long term (current) drug therapy: Secondary | ICD-10-CM | POA: Insufficient documentation

## 2016-12-29 DIAGNOSIS — I10 Essential (primary) hypertension: Secondary | ICD-10-CM | POA: Insufficient documentation

## 2016-12-30 ENCOUNTER — Emergency Department (HOSPITAL_COMMUNITY)
Admission: EM | Admit: 2016-12-30 | Discharge: 2016-12-30 | Disposition: A | Discharge status: Home / Self Care | Payer: Medicaid Other | Attending: Emergency Medicine | Admitting: Emergency Medicine

## 2016-12-30 ENCOUNTER — Encounter (HOSPITAL_COMMUNITY): Payer: Self-pay

## 2016-12-30 DIAGNOSIS — R519 Headache, unspecified: Secondary | ICD-10-CM

## 2016-12-30 DIAGNOSIS — R51 Headache: Secondary | ICD-10-CM

## 2016-12-30 MED ORDER — SODIUM CHLORIDE 0.9 % IV BOLUS (SEPSIS)
1000.0000 mL | Freq: Once | INTRAVENOUS | Status: AC
  Administered 2016-12-30: 1000 mL via INTRAVENOUS

## 2016-12-30 MED ORDER — KETOROLAC TROMETHAMINE 30 MG/ML IJ SOLN
30.0000 mg | Freq: Once | INTRAMUSCULAR | Status: AC
  Administered 2016-12-30: 30 mg via INTRAVENOUS
  Filled 2016-12-30: qty 1

## 2016-12-30 MED ORDER — DIPHENHYDRAMINE HCL 50 MG/ML IJ SOLN
25.0000 mg | Freq: Once | INTRAMUSCULAR | Status: AC
  Administered 2016-12-30: 25 mg via INTRAVENOUS
  Filled 2016-12-30: qty 1

## 2016-12-30 MED ORDER — METOCLOPRAMIDE HCL 5 MG/ML IJ SOLN
10.0000 mg | Freq: Once | INTRAMUSCULAR | Status: AC
  Administered 2016-12-30: 10 mg via INTRAVENOUS
  Filled 2016-12-30: qty 2

## 2016-12-30 NOTE — ED Provider Notes (Signed)
[facility] Provider Note   CSN: [id number] Arrival date & time: 12/29/16  2355     History   Chief Complaint Chief Complaint  Patient presents with  . Migraine    HPI Diane Rice is a 31 y.o. female.  HPI  31 y.o. female with a hx of HTN, presents to the Emergency Department today complaining of migraine headache on right side. Gradual in onset. Notes hx same. Symptoms ongoing x 2 days. Noted N/V taht has resolved. Endorses photophobia. No visual changes or loss of vision. No numbness/tingling. No fevers. No URI symptoms. No CP/SOB/ABD pain. No meds PTA. Rates pain 8/10 and throbbing sensation. No other symptoms noted.   Past Medical History:  Diagnosis Date  . Anemia   . Asthma   . Back pain, chronic   . Hypertension     Patient Active Problem List   Diagnosis Date Noted  . OBESITY 03/19/2010  . ANEMIA, MILD 03/19/2010  . BACK PAIN 03/19/2010  . VITAMIN D DEFICIENCY 01/01/2010  . 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 DEPRESSION 12/30/2009  . CHRONIC PAIN SYNDROME 12/30/2009  . VIRAL URI 12/30/2009  . ASTHMA 12/30/2009    Past Surgical History:  Procedure Laterality Date  . MOLE REMOVAL    . WISDOM TOOTH EXTRACTION      OB History    No data available       Home Medications    Prior to Admission medications   Medication Sig Start Date End Date Taking? Authorizing Provider  acetaminophen (TYLENOL 8 HOUR) 650 MG CR tablet Take 1 tablet (650 mg total) by mouth every 8 (eight) hours as needed for pain. 01/28/15   Kaitlyn Szekalski, PA-C  docusate sodium (COLACE) 100 MG capsule Take 1 capsule (100 mg total) by mouth every 12 (twelve) hours. 12/04/16   Arby Barrette, MD  ferrous sulfate 325 (65 FE) MG tablet Take 1 tablet (325 mg total) by mouth daily with breakfast. 01/22/16 02/22/16  Emi Holes, PA-C  hydrocortisone (ANUSOL-HC) 25 MG suppository Place 1 suppository (25 mg total) rectally 2 (two) times daily. For 7 days 12/04/16   Arby Barrette,  MD  ibuprofen (ADVIL,MOTRIN) 200 MG tablet Take 200 mg by mouth every 6 (six) hours as needed.    Historical Provider, MD  naproxen sodium (ALEVE) 220 MG tablet Take 220 mg by mouth 2 (two) times daily with a meal.    Historical Provider, MD    Family History No family history on file.  Social History Social History  Substance Use Topics  . Smoking status: Never Smoker  . Smokeless tobacco: Never Used  . Alcohol use Yes     Allergies   Hydrocodone-acetaminophen   Review of Systems Review of Systems ROS reviewed and all are negative for acute change except as noted in the HPI.  Physical Exam Updated Vital Signs BP 116/78   Pulse 63   Temp 98.2 F (36.8 C)   Resp 18   Ht 6\' 1"  (1.854 m)   Wt 124.7 kg   LMP 12/29/2016   SpO2 100%   BMI 36.28 kg/m   Physical Exam  Constitutional: She is oriented to person, place, and time. Vital signs are normal. She appears well-developed and well-nourished.  HENT:  Head: Normocephalic and atraumatic.  Right Ear: Hearing normal.  Left Ear: Hearing normal.  Eyes: Conjunctivae and EOM are normal. Pupils are equal, round, and reactive to light.  Neck: Normal range of motion. Neck supple.  Cardiovascular: Normal rate, regular rhythm, normal  heart sounds and intact distal pulses.   Pulmonary/Chest: Effort normal and breath sounds normal.  Musculoskeletal: Normal range of motion.  Neurological: She is alert and oriented to person, place, and time. She has normal strength. No cranial nerve deficit or sensory deficit.  Cranial Nerves:  II: Pupils equal, round, reactive to light III,IV, VI: ptosis not present, extra-ocular motions intact bilaterally  V,VII: smile symmetric, facial light touch sensation equal VIII: hearing grossly normal bilaterally  IX,X: midline uvula rise  XI: bilateral shoulder shrug equal and strong XII: midline tongue extension  Skin: Skin is warm and dry.  Psychiatric: She has a normal mood and affect. Her  speech is normal and behavior is normal. Thought content normal.  Nursing note and vitals reviewed.  ED Treatments / Results  Labs (all labs ordered are listed, but only abnormal results are displayed) Labs Reviewed - No data to display  EKG  EKG Interpretation None       Radiology No results found.  Procedures Procedures (including critical care time)  Medications Ordered in ED Medications  metoCLOPramide (REGLAN) injection 10 mg (10 mg Intravenous Given 12/30/16 0537)  diphenhydrAMINE (BENADRYL) injection 25 mg (25 mg Intravenous Given 12/30/16 0535)  ketorolac (TORADOL) 30 MG/ML injection 30 mg (30 mg Intravenous Given 12/30/16 0533)  sodium chloride 0.9 % bolus 1,000 mL (1,000 mLs Intravenous New Bag/Given 12/30/16 0532)     Initial Impression / Assessment and Plan / ED Course  I have reviewed the triage vital signs and the nursing notes.  Pertinent labs & imaging results that were available during my care of the patient were reviewed by me and considered in my medical decision making (see chart for details).  Final Clinical Impressions(s) / ED Diagnoses     {I have reviewed the relevant previous healthcare records.  {I obtained HPI from historian.   ED Course:  Assessment: Patient is a 31 y.o. female that presents with headache x 2 days. Hx same. No fevers.. Patient is without high-risk features of headache including: Sudden onset/thunderclap HA, No similar headache in past, Altered mental status, Accompanying seizure, Headache with exertion, Age > 50, History of immunocompromise, Neck or shoulder pain, Fever, Use of anticoagulation, Family history of spontaneous SAH, Concomitant drug use, Toxic exposure.  Patient has a normal complete neurological exam, normal vital signs, normal level of consciousness, no signs of meningismus, is well-appearing/non-toxic appearing, no signs of trauma. No papilledema, no pain over the temporal arteries. Imaging with CT/MRI not indicated given  history and physical exam findings. No dangerous or life-threatening conditions suspected or identified by history, physical exam, and by work-up. No indications for hospitalization identified. Given migraine cocktail with relief. At time of discharge, Patient is in no acute distress. Vital Signs are stable. Patient is able to ambulate. Patient able to tolerate PO.    Disposition/Plan:  DC Home Additional Verbal discharge instructions given and discussed with patient.  Pt Instructed to f/u with PCP in the next week for evaluation and treatment of symptoms. Return precautions given Pt acknowledges and agrees with plan  Supervising Physician Mancel BaleElliott Wentz, MD  Final diagnoses:  Nonintractable headache, unspecified chronicity pattern, unspecified headache type    New Prescriptions New Prescriptions   No medications on file     Audry Piliyler Prisma Decarlo, PA-C 12/30/16 [id number]    Mancel BaleElliott Wentz, MD 12/30/16 [phone]

## 2016-12-30 NOTE — ED Notes (Signed)
Pt requesting coffee before leaving

## 2016-12-30 NOTE — ED Notes (Signed)
Pt c/o HA worse than normal, pt also c/o emesis in mornings. Pt states she is out of her migraine meds.  A & O, NAD

## 2016-12-30 NOTE — ED Triage Notes (Signed)
Pt states that she has had a migraine since this AM, vomited x 2 today. Hx of migraines. Photophobia and noises bother her. Pt has not taken anything for the pain.

## 2016-12-30 NOTE — Discharge Instructions (Signed)
Please read and follow all provided instructions.  Your diagnoses today include:  1. Nonintractable headache, unspecified chronicity pattern, unspecified headache type     Tests performed today include: Vital signs. See below for your results today.   Medications:  In the Emergency Department you received: Reglan - antinausea/headache medication Benadryl - antihistamine to counteract potential side effects of reglan Toradol - NSAID medication similar to ibuprofen  Take any prescribed medications only as directed.  Additional information:  Follow any educational materials contained in this packet.  You are having a headache. No specific cause was found today for your headache. It may have been a migraine or other cause of headache. Stress, anxiety, fatigue, and depression are common triggers for headaches.   Your headache today does not appear to be life-threatening or require hospitalization, but often the exact cause of headaches is not determined in the emergency department. Therefore, follow-up with your doctor is very important to find out what may have caused your headache and whether or not you need any further diagnostic testing or treatment.   Sometimes headaches can appear benign (not harmful), but then more serious symptoms can develop which should prompt an immediate re-evaluation by your doctor or the emergency department.  BE VERY CAREFUL not to take multiple medicines containing Tylenol (also called acetaminophen). Doing so can lead to an overdose which can damage your liver and cause liver failure and possibly death.   Follow-up instructions: Please follow-up with your primary care provider in the next 3 days for further evaluation of your symptoms.   Return instructions:  Please return to the Emergency Department if you experience worsening symptoms. Return if the medications do not resolve your headache, if it recurs, or if you have multiple episodes of vomiting or  cannot keep down fluids. Return if you have a change from the usual headache. RETURN IMMEDIATELY IF you: Develop a sudden, severe headache Develop confusion or become poorly responsive or faint Develop a fever above 100.84F or problem breathing Have a change in speech, vision, swallowing, or understanding Develop new weakness, numbness, tingling, incoordination in your arms or legs Have a seizure Please return if you have any other emergent concerns.  Additional Information:  Your vital signs today were: BP 116/78    Pulse 63    Temp 98.2 F (36.8 C)    Resp 18    Ht 6\' 1"  (1.854 m)    Wt 124.7 kg    LMP 12/29/2016    SpO2 100%    BMI 36.28 kg/m  If your blood pressure (BP) was elevated above 135/85 this visit, please have this repeated by your doctor within one month. --------------

## 2017-05-31 ENCOUNTER — Encounter (HOSPITAL_COMMUNITY): Payer: Self-pay

## 2017-05-31 ENCOUNTER — Emergency Department (HOSPITAL_COMMUNITY)
Admission: EM | Admit: 2017-05-31 | Discharge: 2017-05-31 | Disposition: A | Discharge status: Home / Self Care | Payer: Self-pay | Attending: Emergency Medicine | Admitting: Emergency Medicine

## 2017-05-31 DIAGNOSIS — L299 Pruritus, unspecified: Secondary | ICD-10-CM | POA: Insufficient documentation

## 2017-05-31 MED ORDER — HYDROXYZINE HCL 25 MG PO TABS: 25.0000 mg | ORAL_TABLET | Freq: Four times a day (QID) | ORAL | 0 refills | Status: DC

## 2017-05-31 NOTE — ED Provider Notes (Signed)
[facility]-EMERGENCY DEPT Provider Note   CSN: [id number] Arrival date & time: 05/31/17  1420     History   Chief Complaint Chief Complaint  Patient presents with  . Pruritis    HPI Diane Rice is a 31 y.o. female with past medical history of asthma, hypertension, presenting to the ED with acute onset of generalized pruritus that began about 2 days ago. She has not tried any medications prior to arrival for symptoms. Symptoms are made worse with clothes rubbing on her skin. She states she thinks this is from a soap she used one month ago, that caused her family to have itchy rash as well. She states she has not used this soap and about 1 month, however is sure this is the cause. She denies rash, insect bites, recent antibiotics, recent new medications, fever, edema of lips or tongue, difficulty breathing or swallowing, or other complaints today. Denies abdominal pain or history of gallbladder disease.  The history is provided by the patient.    Past Medical History:  Diagnosis Date  . Anemia   . Asthma   . Back pain, chronic   . Hypertension     Patient Active Problem List   Diagnosis Date Noted  . OBESITY 03/19/2010  . ANEMIA, MILD 03/19/2010  . BACK PAIN 03/19/2010  . VITAMIN D DEFICIENCY 01/01/2010  . 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 DEPRESSION 12/30/2009  . CHRONIC PAIN SYNDROME 12/30/2009  . VIRAL URI 12/30/2009  . ASTHMA 12/30/2009    Past Surgical History:  Procedure Laterality Date  . MOLE REMOVAL    . WISDOM TOOTH EXTRACTION      OB History    No data available       Home Medications    Prior to Admission medications   Medication Sig Start Date End Date Taking? Authorizing Provider  acetaminophen (TYLENOL 8 HOUR) 650 MG CR tablet Take 1 tablet (650 mg total) by mouth every 8 (eight) hours as needed for pain. Patient not taking: Reported on 12/30/2016 01/28/15   Emilia Beck, PA-C  docusate sodium (COLACE) 100 MG capsule Take 1 capsule (100  mg total) by mouth every 12 (twelve) hours. Patient not taking: Reported on 12/30/2016 12/04/16   Arby Barrette, MD  ferrous sulfate 325 (65 FE) MG tablet Take 1 tablet (325 mg total) by mouth daily with breakfast. Patient not taking: Reported on 12/30/2016 01/22/16 12/30/16  Emi Holes, PA-C  hydrocortisone (ANUSOL-HC) 25 MG suppository Place 1 suppository (25 mg total) rectally 2 (two) times daily. For 7 days Patient not taking: Reported on 12/30/2016 12/04/16   Arby Barrette, MD  hydrOXYzine (ATARAX/VISTARIL) 25 MG tablet Take 1 tablet (25 mg total) by mouth every 6 (six) hours. 05/31/17   Russo, [place] N, PA-C    Family History No family history on file.  Social History Social History  Substance Use Topics  . Smoking status: Never Smoker  . Smokeless tobacco: Never Used  . Alcohol use Yes     Allergies   Hydrocodone-acetaminophen   Review of Systems Review of Systems  Constitutional: Negative for chills and fever.  HENT: Negative for facial swelling and trouble swallowing.   Respiratory: Negative for chest tightness and shortness of breath.   Gastrointestinal: Negative for abdominal pain and nausea.  Skin: Negative for rash.       itching     Physical Exam Updated Vital Signs BP 120/74 (BP Location: Right Arm)   Pulse 74   Temp 98 F (36.7 C) (Oral)  Resp 18   LMP 05/29/2017 (Approximate)   SpO2 99%   Physical Exam  Constitutional: She appears well-developed and well-nourished. No distress.  HENT:  Head: Normocephalic and atraumatic.  Mouth/Throat: Oropharynx is clear and moist.  Eyes: Conjunctivae are normal.  Neck: Normal range of motion. Neck supple.  Cardiovascular: Normal rate and intact distal pulses.   Pulmonary/Chest: Effort normal.  Abdominal: Soft. Bowel sounds are normal. She exhibits no distension. There is no tenderness.  Negative Murphy's  Skin:  No rashes noted. No color change and skin. Skin is mildly dry, otherwise normal.  Psychiatric: She  has a normal mood and affect. Her behavior is normal.  Nursing note and vitals reviewed.    ED Treatments / Results  Labs (all labs ordered are listed, but only abnormal results are displayed) Labs Reviewed - No data to display  EKG  EKG Interpretation None       Radiology No results found.  Procedures Procedures (including critical care time)  Medications Ordered in ED Medications - No data to display   Initial Impression / Assessment and Plan / ED Course  I have reviewed the triage vital signs and the nursing notes.  Pertinent labs & imaging results that were available during my care of the patient were reviewed by me and considered in my medical decision making (see chart for details).    Patient presenting with generalized pruritus, without rash. No symptoms of angioedema, no recent antibiotics or medications. No abdominal pain/tenderness or history of gallbladder disease. Recommend symptomatic management, Rx of hydroxyzine given. Strict return precautions discussed. Patient is safe for discharge.  Discussed results, findings, treatment and follow up. Patient advised of return precautions. Patient verbalized understanding and agreed with plan.  Final Clinical Impressions(s) / ED Diagnoses   Final diagnoses:  Pruritus    New Prescriptions New Prescriptions   HYDROXYZINE (ATARAX/VISTARIL) 25 MG TABLET    Take 1 tablet (25 mg total) by mouth every 6 (six) hours.     Russo, [place] N, PA-C 05/31/17 1516    Charlynne Pander, MD 06/01/17 [phone]

## 2017-05-31 NOTE — ED Triage Notes (Signed)
Pt presents for evaluation of itching all over body. States family has had allergic reaction to body wash, pt reports her last use was 1 month ago, itching began 2 days ago. Pt denies other allergic rxn symptoms, states itching is worse when she walks and fabric touches skin. No difficulty breathing or sob. No rash.

## 2017-05-31 NOTE — Discharge Instructions (Signed)
Please read instructions below. Take hydroxyzine every 6 hours for itching. Avoid hot showers. Apply lotion regularly. Schedule an appointment with primary care to follow-up if symptoms persist. Return to the ER immediately for feeling your throat closing, swelling of your lips or tongue, difficulty breathing, or new or concerning symptoms.

## 2017-05-31 NOTE — ED Notes (Signed)
Declined W/C at D/C and was escorted to lobby by RN. 

## 2017-10-04 ENCOUNTER — Ambulatory Visit (HOSPITAL_COMMUNITY): Payer: Self-pay | Admitting: Psychiatry

## 2017-11-15 ENCOUNTER — Encounter (HOSPITAL_COMMUNITY): Payer: Self-pay | Admitting: Emergency Medicine

## 2017-11-15 ENCOUNTER — Emergency Department (HOSPITAL_COMMUNITY)
Admission: EM | Admit: 2017-11-15 | Discharge: 2017-11-15 | Disposition: A | Discharge status: Home / Self Care | Payer: Medicaid Other | Attending: Emergency Medicine | Admitting: Emergency Medicine

## 2017-11-15 ENCOUNTER — Emergency Department (HOSPITAL_COMMUNITY): Payer: Medicaid Other

## 2017-11-15 ENCOUNTER — Other Ambulatory Visit: Payer: Self-pay

## 2017-11-15 DIAGNOSIS — J069 Acute upper respiratory infection, unspecified: Secondary | ICD-10-CM | POA: Diagnosis not present

## 2017-11-15 DIAGNOSIS — Z79899 Other long term (current) drug therapy: Secondary | ICD-10-CM | POA: Insufficient documentation

## 2017-11-15 DIAGNOSIS — I10 Essential (primary) hypertension: Secondary | ICD-10-CM | POA: Insufficient documentation

## 2017-11-15 DIAGNOSIS — H9192 Unspecified hearing loss, left ear: Secondary | ICD-10-CM | POA: Diagnosis not present

## 2017-11-15 DIAGNOSIS — R0789 Other chest pain: Secondary | ICD-10-CM | POA: Insufficient documentation

## 2017-11-15 DIAGNOSIS — J45909 Unspecified asthma, uncomplicated: Secondary | ICD-10-CM | POA: Diagnosis not present

## 2017-11-15 DIAGNOSIS — B9789 Other viral agents as the cause of diseases classified elsewhere: Secondary | ICD-10-CM | POA: Diagnosis not present

## 2017-11-15 DIAGNOSIS — R05 Cough: Secondary | ICD-10-CM | POA: Diagnosis present

## 2017-11-15 LAB — CBC WITH DIFFERENTIAL/PLATELET
BASOS PCT: 0 %
Basophils Absolute: 0 10*3/uL (ref 0.0–0.1)
Eosinophils Absolute: 0.2 10*3/uL (ref 0.0–0.7)
Eosinophils Relative: 3 %
HEMATOCRIT: 30.2 % — AB (ref 36.0–46.0)
Hemoglobin: 8.9 g/dL — ABNORMAL LOW (ref 12.0–15.0)
LYMPHS ABS: 2.3 10*3/uL (ref 0.7–4.0)
Lymphocytes Relative: 32 %
MCH: 21.8 pg — AB (ref 26.0–34.0)
MCHC: 29.5 g/dL — AB (ref 30.0–36.0)
MCV: 73.8 fL — ABNORMAL LOW (ref 78.0–100.0)
MONOS PCT: 6 %
Monocytes Absolute: 0.4 10*3/uL (ref 0.1–1.0)
NEUTROS ABS: 4.3 10*3/uL (ref 1.7–7.7)
Neutrophils Relative %: 59 %
Platelets: 405 10*3/uL — ABNORMAL HIGH (ref 150–400)
RBC: 4.09 MIL/uL (ref 3.87–5.11)
RDW: 19.3 % — AB (ref 11.5–15.5)
WBC: 7.2 10*3/uL (ref 4.0–10.5)

## 2017-11-15 LAB — BASIC METABOLIC PANEL
Anion gap: 10 (ref 5–15)
BUN: 8 mg/dL (ref 6–20)
CALCIUM: 9.4 mg/dL (ref 8.9–10.3)
CO2: 22 mmol/L (ref 22–32)
CREATININE: 0.77 mg/dL (ref 0.44–1.00)
Chloride: 106 mmol/L (ref 101–111)
GFR calc non Af Amer: >60 mL/min (ref >60)
GLUCOSE: 84 mg/dL (ref 65–99)
Potassium: 3.9 mmol/L (ref 3.5–5.1)
Sodium: 138 mmol/L (ref 135–145)

## 2017-11-15 MED ORDER — BENZONATATE 100 MG PO CAPS: 100.0000 mg | ORAL_CAPSULE | Freq: Three times a day (TID) | ORAL | 0 refills | Status: DC

## 2017-11-15 MED ORDER — IBUPROFEN 600 MG PO TABS: 600.0000 mg | ORAL_TABLET | Freq: Four times a day (QID) | ORAL | 0 refills | Status: DC | PRN

## 2017-11-15 NOTE — ED Notes (Signed)
EDP at bedside  

## 2017-11-15 NOTE — Discharge Instructions (Signed)
Follow-up with Dr. Jenne PaneBates if hearing does not return to normal as URI resolves.

## 2017-11-15 NOTE — ED Triage Notes (Signed)
Pt c/o cough, productive for yellowish sputum, pain in chest going into back, with left earache== all started at least a week ago.

## 2017-11-15 NOTE — ED Provider Notes (Signed)
[facility] EMERGENCY DEPARTMENT Provider Note   CSN: [id number] Arrival date & time: 11/15/17  1648     History   Chief Complaint Chief Complaint  Patient presents with  . URI    HPI Diane Rice is a 32 y.o. female.  Patient with URI symptoms x 1 week. Persistent cough, yellow sputum. Anterior chest wall pain with coughing. Subtle hearing loss in left ear.   The history is provided by the patient. No language interpreter was used.  URI   This is a new problem. The current episode started more than 1 week ago. The problem has been gradually worsening. There has been no fever. Associated symptoms include congestion and cough. She has tried nothing for the symptoms.    Past Medical History:  Diagnosis Date  . Anemia   . Asthma   . Back pain, chronic   . Hypertension     Patient Active Problem List   Diagnosis Date Noted  . OBESITY 03/19/2010  . ANEMIA, MILD 03/19/2010  . BACK PAIN 03/19/2010  . VITAMIN D DEFICIENCY 01/01/2010  . 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 DEPRESSION 12/30/2009  . CHRONIC PAIN SYNDROME 12/30/2009  . VIRAL URI 12/30/2009  . ASTHMA 12/30/2009    Past Surgical History:  Procedure Laterality Date  . MOLE REMOVAL    . WISDOM TOOTH EXTRACTION      OB History    No data available       Home Medications    Prior to Admission medications   Medication Sig Start Date End Date Taking? Authorizing Provider  acetaminophen (TYLENOL 8 HOUR) 650 MG CR tablet Take 1 tablet (650 mg total) by mouth every 8 (eight) hours as needed for pain. Patient not taking: Reported on 12/30/2016 01/28/15   Emilia Beck, PA-C  docusate sodium (COLACE) 100 MG capsule Take 1 capsule (100 mg total) by mouth every 12 (twelve) hours. Patient not taking: Reported on 12/30/2016 12/04/16   Arby Barrette, MD  ferrous sulfate 325 (65 FE) MG tablet Take 1 tablet (325 mg total) by mouth daily with breakfast. Patient not taking: Reported on  12/30/2016 01/22/16 12/30/16  Emi Holes, PA-C  hydrocortisone (ANUSOL-HC) 25 MG suppository Place 1 suppository (25 mg total) rectally 2 (two) times daily. For 7 days Patient not taking: Reported on 12/30/2016 12/04/16   Arby Barrette, MD  hydrOXYzine (ATARAX/VISTARIL) 25 MG tablet Take 1 tablet (25 mg total) by mouth every 6 (six) hours. 05/31/17   Robinson, [place] N, PA-C    Family History No family history on file.  Social History Social History   Tobacco Use  . Smoking status: Never Smoker  . Smokeless tobacco: Never Used  Substance Use Topics  . Alcohol use: Yes    Comment: socially  . Drug use: No     Allergies   Hydrocodone-acetaminophen   Review of Systems Review of Systems  Constitutional: Positive for fatigue. Negative for fever.  HENT: Positive for congestion.   Respiratory: Positive for cough.   All other systems reviewed and are negative.    Physical Exam Updated Vital Signs BP 118/80 (BP Location: Right Arm)   Pulse 74   Temp (!) 97.4 F (36.3 C) (Oral)   Resp 18   Ht 6' (1.829 m)   LMP 11/11/2017 (Exact Date)   SpO2 96%   BMI 37.30 kg/m   Physical Exam  Constitutional: She is oriented to person, place, and time. She appears well-developed and well-nourished.  HENT:  Right Ear: Tympanic  membrane normal.  Left Ear: Tympanic membrane normal. No tenderness. No foreign bodies. No mastoid tenderness. Tympanic membrane is not injected.  No middle ear effusion. Decreased hearing is noted.  Eyes: Conjunctivae are normal.  Neck: Neck supple.  Cardiovascular: Normal rate and regular rhythm.  Pulmonary/Chest: Effort normal and breath sounds normal.  Abdominal: Soft. Bowel sounds are normal.  Musculoskeletal: Normal range of motion. She exhibits no tenderness.  Lymphadenopathy:    She has no cervical adenopathy.  Neurological: She is oriented to person, place, and time.  Skin: Skin is warm and dry.  Psychiatric: She has a normal mood and affect.    Nursing note and vitals reviewed.    ED Treatments / Results  Labs (all labs ordered are listed, but only abnormal results are displayed) Labs Reviewed  CBC WITH DIFFERENTIAL/PLATELET - Abnormal; Notable for the following components:      Result Value   Hemoglobin 8.9 (*)    HCT 30.2 (*)    MCV 73.8 (*)    MCH 21.8 (*)    MCHC 29.5 (*)    RDW 19.3 (*)    Platelets 405 (*)    All other components within normal limits  BASIC METABOLIC PANEL    EKG  EKG Interpretation None       Radiology Dg Chest 2 View  Result Date: 11/15/2017 CLINICAL DATA:  Center chest pain that radiates posteriorly to the back of the pt, SOB and left side hearing loss x3 days. Hx of HTN, asthma. Ex-smoker, quit x10 years ago. Pt states she is not currently taking medication for her HTN. EXAM: CHEST - 2 VIEW COMPARISON:  CT of the chest 06/22/2015 FINDINGS: The heart size and mediastinal contours are within normal limits. Both lungs are clear. The visualized skeletal structures are unremarkable. IMPRESSION: No active cardiopulmonary disease. Electronically Signed   By: Norva PavlovElizabeth  Brown M.D.   On: 11/15/2017 18:21    Procedures Procedures (including critical care time)  Medications Ordered in ED Medications - No data to display   Initial Impression / Assessment and Plan / ED Course  I have reviewed the triage vital signs and the nursing notes.  Pertinent labs & imaging results that were available during my care of the patient were reviewed by me and considered in my medical decision making (see chart for details).    Stable anemia.   Pt symptoms consistent with URI. CXR negative for acute infiltrate. Subtle decrease in hearing left ear. Follow-up with ENT if normal hearing does not return as URI resolves. Pt will be discharged with symptomatic treatment.  Discussed return precautions.  Pt is hemodynamically stable & in NAD prior to discharge.  Final Clinical Impressions(s) / ED Diagnoses    Final diagnoses:  Viral URI with cough    ED Discharge Orders        Ordered    ibuprofen (ADVIL,MOTRIN) 600 MG tablet  Every 6 hours PRN     11/15/17 1942    benzonatate (TESSALON) 100 MG capsule  Every 8 hours     11/15/17 1942       Felicie MornSmith, Merinda Victorino, NP 11/15/17 2145    Maia PlanLong, Joshua G, MD 11/16/17 1428

## 2017-11-16 ENCOUNTER — Encounter (HOSPITAL_COMMUNITY): Payer: Self-pay | Admitting: Psychiatry

## 2017-11-16 ENCOUNTER — Ambulatory Visit (INDEPENDENT_AMBULATORY_CARE_PROVIDER_SITE_OTHER): Payer: Self-pay | Admitting: Psychiatry

## 2017-11-16 VITALS — BP 126/80 | HR 73 | Ht 72.0 in | Wt 286.2 lb

## 2017-11-16 DIAGNOSIS — Z915 Personal history of self-harm: Secondary | ICD-10-CM

## 2017-11-16 DIAGNOSIS — G4700 Insomnia, unspecified: Secondary | ICD-10-CM

## 2017-11-16 DIAGNOSIS — F4310 Post-traumatic stress disorder, unspecified: Secondary | ICD-10-CM

## 2017-11-16 DIAGNOSIS — Z62810 Personal history of physical and sexual abuse in childhood: Secondary | ICD-10-CM

## 2017-11-16 DIAGNOSIS — F319 Bipolar disorder, unspecified: Secondary | ICD-10-CM

## 2017-11-16 DIAGNOSIS — R441 Visual hallucinations: Secondary | ICD-10-CM

## 2017-11-16 DIAGNOSIS — M549 Dorsalgia, unspecified: Secondary | ICD-10-CM

## 2017-11-16 DIAGNOSIS — Z818 Family history of other mental and behavioral disorders: Secondary | ICD-10-CM

## 2017-11-16 DIAGNOSIS — R450 Nervousness: Secondary | ICD-10-CM

## 2017-11-16 DIAGNOSIS — E669 Obesity, unspecified: Secondary | ICD-10-CM

## 2017-11-16 DIAGNOSIS — R440 Auditory hallucinations: Secondary | ICD-10-CM

## 2017-11-16 DIAGNOSIS — Z6838 Body mass index (BMI) 38.0-38.9, adult: Secondary | ICD-10-CM

## 2017-11-16 DIAGNOSIS — Z653 Problems related to other legal circumstances: Secondary | ICD-10-CM

## 2017-11-16 DIAGNOSIS — R51 Headache: Secondary | ICD-10-CM

## 2017-11-16 DIAGNOSIS — F419 Anxiety disorder, unspecified: Secondary | ICD-10-CM

## 2017-11-16 DIAGNOSIS — F515 Nightmare disorder: Secondary | ICD-10-CM

## 2017-11-16 MED ORDER — ARIPIPRAZOLE 5 MG PO TABS: ORAL_TABLET | ORAL | 0 refills | Status: DC
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
## 2017-11-24 ENCOUNTER — Encounter (HOSPITAL_COMMUNITY): Payer: Self-pay | Admitting: Psychology

## 2017-11-24 ENCOUNTER — Ambulatory Visit (HOSPITAL_COMMUNITY): Payer: Self-pay | Admitting: Psychology

## 2017-11-24 NOTE — Progress Notes (Signed)
Hulan FessDominique Lichtenberger is a 32 y.o. female patient referred by Dr. Lolly MustacheArfeen who didn't show for appointment.         Forde RadonYATES,LEANNE, LPC

## 2017-12-15 ENCOUNTER — Ambulatory Visit (HOSPITAL_COMMUNITY): Payer: Self-pay | Admitting: Psychiatry

## 2018-03-21 ENCOUNTER — Encounter (HOSPITAL_COMMUNITY): Payer: Self-pay | Admitting: Emergency Medicine

## 2018-03-21 ENCOUNTER — Emergency Department (HOSPITAL_COMMUNITY)
Admission: EM | Admit: 2018-03-21 | Discharge: 2018-03-21 | Disposition: A | Discharge status: Home / Self Care | Payer: Medicaid Other | Attending: Emergency Medicine | Admitting: Emergency Medicine

## 2018-03-21 DIAGNOSIS — M545 Low back pain: Secondary | ICD-10-CM | POA: Insufficient documentation

## 2018-03-21 DIAGNOSIS — M79605 Pain in left leg: Secondary | ICD-10-CM | POA: Insufficient documentation

## 2018-03-21 DIAGNOSIS — Z5321 Procedure and treatment not carried out due to patient leaving prior to being seen by health care provider: Secondary | ICD-10-CM | POA: Insufficient documentation

## 2018-03-21 DIAGNOSIS — M79604 Pain in right leg: Secondary | ICD-10-CM | POA: Insufficient documentation

## 2018-03-21 NOTE — ED Triage Notes (Signed)
Pt presents with lower back that is chronic but worse with bilat leg pain and weakness; today patient having problems controlling bowel/urine

## 2018-03-22 ENCOUNTER — Emergency Department (HOSPITAL_COMMUNITY)
Admission: EM | Admit: 2018-03-22 | Discharge: 2018-03-22 | Disposition: A | Discharge status: Home / Self Care | Payer: Self-pay | Attending: Emergency Medicine | Admitting: Emergency Medicine

## 2018-03-22 ENCOUNTER — Emergency Department (HOSPITAL_COMMUNITY): Payer: Self-pay

## 2018-03-22 ENCOUNTER — Encounter (HOSPITAL_COMMUNITY): Payer: Self-pay | Admitting: Emergency Medicine

## 2018-03-22 ENCOUNTER — Other Ambulatory Visit: Payer: Self-pay

## 2018-03-22 DIAGNOSIS — Z79899 Other long term (current) drug therapy: Secondary | ICD-10-CM | POA: Insufficient documentation

## 2018-03-22 DIAGNOSIS — M5410 Radiculopathy, site unspecified: Secondary | ICD-10-CM

## 2018-03-22 DIAGNOSIS — M5416 Radiculopathy, lumbar region: Secondary | ICD-10-CM | POA: Insufficient documentation

## 2018-03-22 DIAGNOSIS — I10 Essential (primary) hypertension: Secondary | ICD-10-CM | POA: Insufficient documentation

## 2018-03-22 DIAGNOSIS — J45909 Unspecified asthma, uncomplicated: Secondary | ICD-10-CM | POA: Insufficient documentation

## 2018-03-22 LAB — I-STAT CHEM 8, ED
BUN: 6 mg/dL (ref 6–20)
CALCIUM ION: 1.21 mmol/L (ref 1.15–1.40)
Chloride: 104 mmol/L (ref 98–111)
Creatinine, Ser: 0.8 mg/dL (ref 0.44–1.00)
Glucose, Bld: 107 mg/dL — ABNORMAL HIGH (ref 70–99)
HCT: 26 % — ABNORMAL LOW (ref 36.0–46.0)
Hemoglobin: 8.8 g/dL — ABNORMAL LOW (ref 12.0–15.0)
Potassium: 4 mmol/L (ref 3.5–5.1)
SODIUM: 139 mmol/L (ref 135–145)
TCO2: 25 mmol/L (ref 22–32)

## 2018-03-22 LAB — CBC WITH DIFFERENTIAL/PLATELET
BASOS ABS: 0.1 10*3/uL (ref 0.0–0.1)
BASOS PCT: 1 %
EOS ABS: 0.1 10*3/uL (ref 0.0–0.7)
Eosinophils Relative: 1 %
HCT: 28.1 % — ABNORMAL LOW (ref 36.0–46.0)
Hemoglobin: 7.8 g/dL — ABNORMAL LOW (ref 12.0–15.0)
Lymphocytes Relative: 25 %
Lymphs Abs: 1.5 10*3/uL (ref 0.7–4.0)
MCH: 20.3 pg — AB (ref 26.0–34.0)
MCHC: 27.8 g/dL — AB (ref 30.0–36.0)
MCV: 73 fL — ABNORMAL LOW (ref 78.0–100.0)
Monocytes Absolute: 0.3 10*3/uL (ref 0.1–1.0)
Monocytes Relative: 5 %
NEUTROS ABS: 3.8 10*3/uL (ref 1.7–7.7)
NEUTROS PCT: 68 %
PLATELETS: 309 10*3/uL (ref 150–400)
RBC: 3.85 MIL/uL — ABNORMAL LOW (ref 3.87–5.11)
RDW: 21.1 % — ABNORMAL HIGH (ref 11.5–15.5)
WBC: 5.8 10*3/uL (ref 4.0–10.5)

## 2018-03-22 LAB — POC URINE PREG, ED: PREG TEST UR: NEGATIVE

## 2018-03-22 MED ORDER — GADOBENATE DIMEGLUMINE 529 MG/ML IV SOLN
20.0000 mL | Freq: Once | INTRAVENOUS | Status: AC | PRN
  Administered 2018-03-22: 20 mL via INTRAVENOUS

## 2018-03-22 MED ORDER — PREDNISONE 20 MG PO TABS: ORAL_TABLET | ORAL | 0 refills | Status: DC

## 2018-03-22 MED ORDER — PREDNISONE 20 MG PO TABS
60.0000 mg | ORAL_TABLET | Freq: Once | ORAL | Status: AC
  Administered 2018-03-22: 60 mg via ORAL
  Filled 2018-03-22: qty 3

## 2018-03-22 MED ORDER — CYCLOBENZAPRINE HCL 10 MG PO TABS: 10.0000 mg | ORAL_TABLET | Freq: Two times a day (BID) | ORAL | 0 refills | Status: DC | PRN

## 2018-03-22 MED ORDER — IBUPROFEN 600 MG PO TABS: 600.0000 mg | ORAL_TABLET | Freq: Four times a day (QID) | ORAL | 0 refills | Status: DC | PRN

## 2018-03-22 MED ORDER — CYCLOBENZAPRINE HCL 10 MG PO TABS
10.0000 mg | ORAL_TABLET | Freq: Once | ORAL | Status: AC
  Administered 2018-03-22: 10 mg via ORAL
  Filled 2018-03-22: qty 1

## 2018-03-22 MED ORDER — IBUPROFEN 800 MG PO TABS
800.0000 mg | ORAL_TABLET | Freq: Once | ORAL | Status: AC
  Administered 2018-03-22: 800 mg via ORAL
  Filled 2018-03-22: qty 1

## 2018-03-22 NOTE — ED Notes (Signed)
Upon attempting to dc patient, patient became very upset and stated that she was concerned that no further testing had been done in the ED to determine the cause of her symptoms. This RN explained that any life threatening causes had been ruled out by the provider and that she had instructions to f/u with neurology for further testing of ongoing pain. Patient stated she was willing to do that but that she has no insurance and no means of paying to see a neurologist. PA Laveda Normanran spoke with patient and this RN regarding situation and advised he would speak with Dr. Madilyn Hookees regarding the possibility of ordering an MRI for patient.

## 2018-03-22 NOTE — ED Provider Notes (Addendum)
[facility] EMERGENCY DEPARTMENT Provider Note   CSN: [id number] Arrival date & time: 03/22/18  4098     History   Chief Complaint Chief Complaint  Patient presents with  . Back Pain  . Hip Pain    HPI Diane Rice is a 32 y.o. female.  The history is provided by the patient. No language interpreter was used.  Back Pain    Hip Pain      32 year old female with history of chronic back pain, hypertension, anemia presenting for evaluation of back and hip pain.  Patient mention in she does have history of chronic back pain.  Pain is sharp throbbing, waxing waning likely for the past several days she has worsening back pain, and feels as if her hips and legs "give out due to weakness.  She also report occasional right shoulder pain and weakness and occasional blurry vision.  Patient also reports occasional bowel bladder weakness with urge incontinence.  Pt did tried OTC meds (tylenol, ibuprofen) but felt she is now taking too much so she tries to avoid it.  She denies fever, chills, rash, numbness.  Patient does report long-standing at work.  She denies any recent injury.  No family history of MS.  Past Medical History:  Diagnosis Date  . Anemia   . Asthma   . Back pain, chronic   . Hypertension     Patient Active Problem List   Diagnosis Date Noted  . OBESITY 03/19/2010  . ANEMIA, MILD 03/19/2010  . BACK PAIN 03/19/2010  . VITAMIN D DEFICIENCY 01/01/2010  . 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 DEPRESSION 12/30/2009  . CHRONIC PAIN SYNDROME 12/30/2009  . VIRAL URI 12/30/2009  . ASTHMA 12/30/2009    Past Surgical History:  Procedure Laterality Date  . MOLE REMOVAL    . WISDOM TOOTH EXTRACTION       OB History   None      Home Medications    Prior to Admission medications   Medication Sig Start Date End Date Taking? Authorizing Provider  ARIPiprazole (ABILIFY) 5 MG tablet Take 1/2 tab daily for 1 week and than full tab daily 11/16/17    Arfeen, Phillips Grout, MD  aspirin 500 MG EC tablet Take 500 mg by mouth every 6 (six) hours as needed for pain.    [provider]  benzonatate (TESSALON) 100 MG capsule Take 1 capsule (100 mg total) by mouth every 8 (eight) hours. Patient not taking: Reported on 11/16/2017 11/15/17   Felicie Morn, NP  ibuprofen (ADVIL,MOTRIN) 600 MG tablet Take 1 tablet (600 mg total) by mouth every 6 (six) hours as needed. 11/15/17   Felicie Morn, NP    Family History Family History  Problem Relation Age of Onset  . Depression Mother   . Bipolar disorder Sister     Social History Social History   Tobacco Use  . Smoking status: Never Smoker  . Smokeless tobacco: Never Used  Substance Use Topics  . Alcohol use: Yes    Alcohol/week: 0.6 - 1.2 oz    Types: 1 - 2 Cans of beer per week    Comment: socially  . Drug use: No     Allergies   Hydrocodone-acetaminophen   Review of Systems Review of Systems  Musculoskeletal: Positive for back pain.  All other systems reviewed and are negative.    Physical Exam Updated Vital Signs BP 116/65 (BP Location: Right Arm)   Pulse 70   Temp 98.6 F (37 C) (Oral)  Resp 16   Ht 6' (1.829 m)   Wt 120.2 kg (265 lb)   LMP 03/17/2018   SpO2 98%   BMI 35.94 kg/m   Physical Exam  Constitutional: She appears well-developed and well-nourished. No distress.  Obese female resting comfortably in no acute distress.  HENT:  Head: Atraumatic.  Mouth/Throat: Oropharynx is clear and moist.  Eyes: Conjunctivae are normal.  Neck: Neck supple.  Cardiovascular: Normal rate and regular rhythm.  Pulmonary/Chest: Effort normal and breath sounds normal.  Abdominal: Soft.  Musculoskeletal: She exhibits tenderness (Tenderness along lumbar spine and paraspinal muscle on palpation without crepitus or step-off.  No overlying skin changes.).  Neurological: She is alert.  5 out of 5 strength all 4 extremities with intact sensation.  Distal pulses palpable.  Patellar  addition reflex intact bilaterally, no foot drops.  Skin: No rash noted.  Psychiatric: She has a normal mood and affect.  Nursing note and vitals reviewed.    ED Treatments / Results  Labs (all labs ordered are listed, but only abnormal results are displayed) Labs Reviewed  CBC WITH DIFFERENTIAL/PLATELET - Abnormal; Notable for the following components:      Result Value   RBC 3.85 (*)    Hemoglobin 7.8 (*)    HCT 28.1 (*)    MCV 73.0 (*)    MCH 20.3 (*)    MCHC 27.8 (*)    RDW 21.1 (*)    All other components within normal limits  I-STAT CHEM 8, ED - Abnormal; Notable for the following components:   Glucose, Bld 107 (*)    Hemoglobin 8.8 (*)    HCT 26.0 (*)    All other components within normal limits  POC URINE PREG, ED    EKG None  Radiology Dg Lumbar Spine Complete  Result Date: 03/22/2018 CLINICAL DATA:  Lumbago, chronic, with acute exacerbation EXAM: LUMBAR SPINE - COMPLETE 4+ VIEW COMPARISON:  None. FINDINGS: Frontal, lateral, spot lumbosacral lateral, and bilateral oblique views were obtained. There are 5 non-rib-bearing lumbar type vertebral bodies. There is no fracture or spondylolisthesis. The disc spaces appear unremarkable. There is no appreciable facet arthropathy. IMPRESSION: No fracture or spondylolisthesis.  No evident arthropathy. Electronically Signed   By: Bretta Bang III M.D.   On: 03/22/2018 13:34   Mr Laqueta Jean And Wo Contrast  Result Date: 03/22/2018 CLINICAL DATA:  Weakness, possible demyelinating disease. EXAM: MRI HEAD WITHOUT AND WITH CONTRAST MRI CERVICAL SPINE WITHOUT AND WITH CONTRAST TECHNIQUE: Multiplanar, multiecho pulse sequences of the brain and surrounding structures, and cervical spine, to include the craniocervical junction and cervicothoracic junction, were obtained without and with intravenous contrast. CONTRAST:  20mL MULTIHANCE GADOBENATE DIMEGLUMINE 529 MG/ML IV SOLN COMPARISON:  None. FINDINGS: MRI HEAD FINDINGS Brain: No acute  infarction, hemorrhage, hydrocephalus, extra-axial collection or mass lesion. Normal cerebral volume. Sagittal and FLAIR imaging demonstrate no white matter disease. Specifically, no evidence for demyelinating disease. Callosal-septal interface is normal, and no periventricular abnormalities are observed. Post infusion, no abnormal enhancement of the brain or meninges. Vascular: Normal flow voids. Skull and upper cervical spine: Low signal intensity bone marrow likely secondary to anemia and or body habitus. No focal abnormalities. No tonsillar herniation. Cervical spine reported below. Sinuses/Orbits: Negative. Other: Shotty cervical lymph nodes. MRI CERVICAL SPINE FINDINGS Alignment: Physiologic. Vertebrae: No fracture, evidence of discitis, or bone lesion. Cord: Normal signal and morphology from the craniocervical junction through mid T2. No evidence for demyelinating disease. No abnormal postcontrast enhancement. Posterior Fossa, vertebral arteries, paraspinal  tissues: Posterior fossa reported separately. BILATERAL vertebral flow voids are maintained. No paraspinous masses. Disc levels: No disc protrusion or spinal stenosis. No foraminal narrowing is evident IMPRESSION: Negative MRI brain and cervical spine. No acute or focal intracranial abnormality. No abnormal postcontrast enhancement. No evidence for demyelinating disease of the brain or visible cervicothoracic cord. Electronically Signed   By: Elsie StainJohn T Curnes M.D.   On: 03/22/2018 15:38   Mr Cervical Spine W Or Wo Contrast  Result Date: 03/22/2018 CLINICAL DATA:  Weakness, possible demyelinating disease. EXAM: MRI HEAD WITHOUT AND WITH CONTRAST MRI CERVICAL SPINE WITHOUT AND WITH CONTRAST TECHNIQUE: Multiplanar, multiecho pulse sequences of the brain and surrounding structures, and cervical spine, to include the craniocervical junction and cervicothoracic junction, were obtained without and with intravenous contrast. CONTRAST:  20mL MULTIHANCE GADOBENATE  DIMEGLUMINE 529 MG/ML IV SOLN COMPARISON:  None. FINDINGS: MRI HEAD FINDINGS Brain: No acute infarction, hemorrhage, hydrocephalus, extra-axial collection or mass lesion. Normal cerebral volume. Sagittal and FLAIR imaging demonstrate no white matter disease. Specifically, no evidence for demyelinating disease. Callosal-septal interface is normal, and no periventricular abnormalities are observed. Post infusion, no abnormal enhancement of the brain or meninges. Vascular: Normal flow voids. Skull and upper cervical spine: Low signal intensity bone marrow likely secondary to anemia and or body habitus. No focal abnormalities. No tonsillar herniation. Cervical spine reported below. Sinuses/Orbits: Negative. Other: Shotty cervical lymph nodes. MRI CERVICAL SPINE FINDINGS Alignment: Physiologic. Vertebrae: No fracture, evidence of discitis, or bone lesion. Cord: Normal signal and morphology from the craniocervical junction through mid T2. No evidence for demyelinating disease. No abnormal postcontrast enhancement. Posterior Fossa, vertebral arteries, paraspinal tissues: Posterior fossa reported separately. BILATERAL vertebral flow voids are maintained. No paraspinous masses. Disc levels: No disc protrusion or spinal stenosis. No foraminal narrowing is evident IMPRESSION: Negative MRI brain and cervical spine. No acute or focal intracranial abnormality. No abnormal postcontrast enhancement. No evidence for demyelinating disease of the brain or visible cervicothoracic cord. Electronically Signed   By: Elsie StainJohn T Curnes M.D.   On: 03/22/2018 15:38   Dg Hip Unilat W Or Wo Pelvis 2-3 Views Right  Result Date: 03/22/2018 CLINICAL DATA:  Pain for several months EXAM: DG HIP (WITH OR WITHOUT PELVIS) 2-3V RIGHT COMPARISON:  None. FINDINGS: Frontal pelvis as well as frontal and lateral right hip images were obtained. No fracture or dislocation. Joint spaces appear normal. No erosive change. IMPRESSION: No fracture or dislocation.   No evident arthropathy. Electronically Signed   By: Bretta BangWilliam  Woodruff III M.D.   On: 03/22/2018 10:50    Procedures Procedures (including critical care time)  Medications Ordered in ED Medications  predniSONE (DELTASONE) tablet 60 mg (60 mg Oral Given 03/22/18 1014)  cyclobenzaprine (FLEXERIL) tablet 10 mg (10 mg Oral Given 03/22/18 1014)  ibuprofen (ADVIL,MOTRIN) tablet 800 mg (800 mg Oral Given 03/22/18 1100)  gadobenate dimeglumine (MULTIHANCE) injection 20 mL (20 mLs Intravenous Contrast Given 03/22/18 1504)     Initial Impression / Assessment and Plan / ED Course  I have reviewed the triage vital signs and the nursing notes.  Pertinent labs & imaging results that were available during my care of the patient were reviewed by me and considered in my medical decision making (see chart for details).     BP 116/65 (BP Location: Right Arm)   Pulse 70   Temp 98.6 F (37 C) (Oral)   Resp 16   Ht 6' (1.829 m)   Wt 120.2 kg (265 lb)   LMP 03/17/2018  SpO2 98%   BMI 35.94 kg/m    Final Clinical Impressions(s) / ED Diagnoses   Final diagnoses:  Radicular leg pain    ED Discharge Orders        Ordered    predniSONE (DELTASONE) 20 MG tablet     03/22/18 0936    cyclobenzaprine (FLEXERIL) 10 MG tablet  2 times daily PRN     03/22/18 0936    ibuprofen (ADVIL,MOTRIN) 600 MG tablet  Every 6 hours PRN     03/22/18 0936     Patient here with chronic back pain now with some radicular pain.  Her exam is nonfocal.  No red flags concerning for cauda equina.  She does have some symptoms that may suggest underlying MS when consider intermittent blurry vision, intermittent right arm pain weakness as well as her leg sometime feels as if it was going to give out.  She is able to ambulate, visual acuity normal.  I believe patient will benefit from an outpatient follow-up with neurology for further evaluation of her condition or with her primary care provider.  Will prescribe a course of  steroid, muscle relaxant and anti-inflammatory medication for her symptoms at this time.  Strict return precaution given.  9:55 AM Upon discharge, pt report her condition is affecting her work and she does not feel comfortable going home without further evaluation.  Pt request MRI scan.     12:05 PM I discussed care with DR. Madilyn Hook and with neurologist DR. Aroor who recommend brain and Cervical spine MRI w and w/out CM.    4:32 PM MRI of the brain and cervical spine is without any acute finding.  X-ray of lumbar spine and right hip and pelvis is unremarkable.  I discussed this finding with patient.  She is stable for discharge.  Return precautions discussed.   Fayrene Helper, PA-C 03/22/18 1633    Tilden Fossa, MD 03/23/18 [id number]

## 2018-03-22 NOTE — ED Notes (Signed)
Pt has not returned from MRI 

## 2018-03-22 NOTE — ED Triage Notes (Signed)
Pt. Stated, I started having back, hip pain with spasms for a week. I also have a knot on my knot on lower back

## 2018-03-22 NOTE — ED Notes (Signed)
Patient transported to MRI 

## 2018-03-22 NOTE — ED Notes (Signed)
Pt ambulating to bathroom.

## 2018-03-22 NOTE — Discharge Instructions (Signed)
Please take steroid, muscle relaxant, and anti-inflammatory medication as prescribed for your pain.  Follow-up closely with your primary care doctor for further evaluation.  Request to be evaluated for potential multiple sclerosis disease if your condition worsen.  Return if you have any concerns.

## 2018-03-22 NOTE — ED Notes (Signed)
Pt moved to hallway. 

## 2018-03-22 NOTE — ED Notes (Signed)
IV attempt x2.

## 2018-03-22 NOTE — ED Notes (Signed)
Patient ambulated in the hallway. Patient would stop at times a state that she had pain that came from her back to her pelvic area.  Patient stated that she felt a pinching pain while ambulating.

## 2018-03-22 NOTE — ED Notes (Signed)
Pt remains in MRI 

## 2018-04-06 ENCOUNTER — Encounter

## 2018-04-06 ENCOUNTER — Ambulatory Visit (HOSPITAL_COMMUNITY): Payer: Self-pay | Admitting: Psychiatry

## 2018-11-03 ENCOUNTER — Emergency Department (HOSPITAL_COMMUNITY)
Admission: EM | Admit: 2018-11-03 | Discharge: 2018-11-03 | Disposition: A | Discharge status: Home / Self Care | Payer: Medicaid Other | Attending: Emergency Medicine | Admitting: Emergency Medicine

## 2018-11-03 ENCOUNTER — Encounter (HOSPITAL_COMMUNITY): Payer: Self-pay | Admitting: Emergency Medicine

## 2018-11-03 ENCOUNTER — Other Ambulatory Visit: Payer: Self-pay

## 2018-11-03 DIAGNOSIS — M5441 Lumbago with sciatica, right side: Secondary | ICD-10-CM | POA: Insufficient documentation

## 2018-11-03 DIAGNOSIS — Z79899 Other long term (current) drug therapy: Secondary | ICD-10-CM | POA: Insufficient documentation

## 2018-11-03 DIAGNOSIS — J45909 Unspecified asthma, uncomplicated: Secondary | ICD-10-CM | POA: Insufficient documentation

## 2018-11-03 DIAGNOSIS — I10 Essential (primary) hypertension: Secondary | ICD-10-CM | POA: Insufficient documentation

## 2018-11-03 MED ORDER — PREDNISONE 20 MG PO TABS
60.0000 mg | ORAL_TABLET | Freq: Once | ORAL | Status: AC
  Administered 2018-11-03: 60 mg via ORAL
  Filled 2018-11-03: qty 3

## 2018-11-03 MED ORDER — PREDNISONE 20 MG PO TABS: 60.0000 mg | ORAL_TABLET | Freq: Every day | ORAL | 0 refills | Status: AC

## 2018-11-03 MED ORDER — LIDOCAINE 5 % EX PTCH
1.0000 | MEDICATED_PATCH | CUTANEOUS | Status: DC
  Administered 2018-11-03: 1 via TRANSDERMAL
  Filled 2018-11-03: qty 1

## 2018-11-03 MED ORDER — OXYCODONE-ACETAMINOPHEN 5-325 MG PO TABS
1.0000 | ORAL_TABLET | Freq: Once | ORAL | Status: AC
  Administered 2018-11-03: 1 via ORAL
  Filled 2018-11-03: qty 1

## 2018-11-03 MED ORDER — METHOCARBAMOL 500 MG PO TABS
500.0000 mg | ORAL_TABLET | Freq: Once | ORAL | Status: AC
  Administered 2018-11-03: 500 mg via ORAL
  Filled 2018-11-03: qty 1

## 2018-11-03 MED ORDER — CYCLOBENZAPRINE HCL 10 MG PO TABS: 10.0000 mg | ORAL_TABLET | Freq: Two times a day (BID) | ORAL | 0 refills | Status: DC | PRN

## 2018-11-03 MED ORDER — KETOROLAC TROMETHAMINE 30 MG/ML IJ SOLN
30.0000 mg | Freq: Once | INTRAMUSCULAR | Status: AC
  Administered 2018-11-03: 30 mg via INTRAMUSCULAR
  Filled 2018-11-03: qty 1

## 2018-11-03 MED ORDER — IBUPROFEN 600 MG PO TABS: 600.0000 mg | ORAL_TABLET | Freq: Four times a day (QID) | ORAL | 0 refills | Status: DC | PRN

## 2018-11-03 NOTE — ED Triage Notes (Signed)
Pt reports she has had a knot on her back that is causing her to have pain and muscle spasms.

## 2018-11-03 NOTE — Progress Notes (Signed)
ED NCM referral for PCP/meds. Pt to follow up on [facility] and meds at [facility] $4 list. Isidoro Donning RN CCM Case Mgmt phone [phone]

## 2018-11-03 NOTE — ED Provider Notes (Signed)
[facility] EMERGENCY DEPARTMENT Provider Note   CSN: [id number] Arrival date & time: 11/03/18  1420    History   Chief Complaint Chief Complaint  Patient presents with  . Bask Spasms    HPI Diane Rice is a 33 y.o. female.     Diane Rice is a 33 y.o. female with a history of hypertension, asthma, anemia and chronic back pain, who presents to the emergency department for evaluation of worsening back pain.  Reports that she feels like she has a knot in the muscles on the right side of her back that is causing her to have worsening back pain and muscle spasms.  She reports pain radiates from the knot in her mid to low back down her right leg and hip.  She reports she was seen for similar pain a little over 6 months ago at which point she was also having some other neurologic symptoms which raise concern for possible MS and she had x-rays of the lumbar spine and hip as well as MRIs of the head and neck which showed no evidence of demyelinating disease or MS.  She reports she has not been able to follow-up with anyone since then because she does not have insurance.  She has been taking Bayer back and body for her symptoms but reports is become increasingly difficult to walk due to the pain radiating down her right leg.  She denies any associated numbness or weakness.  No loss of bowel or bladder control or saddle anesthesia.  No fevers, personal history of cancer or IV drug use.  No other aggravating or alleviating factors.  Currently she denies any associated vision changes, headache, upper extremity numbness or weakness or any paresthesias.     Past Medical History:  Diagnosis Date  . Anemia   . Asthma   . Back pain, chronic   . Hypertension     Patient Active Problem List   Diagnosis Date Noted  . OBESITY 03/19/2010  . ANEMIA, MILD 03/19/2010  . BACK PAIN 03/19/2010  . VITAMIN D DEFICIENCY 01/01/2010  . 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  DEPRESSION 12/30/2009  . CHRONIC PAIN SYNDROME 12/30/2009  . VIRAL URI 12/30/2009  . ASTHMA 12/30/2009    Past Surgical History:  Procedure Laterality Date  . MOLE REMOVAL    . WISDOM TOOTH EXTRACTION       OB History   No obstetric history on file.      Home Medications    Prior to Admission medications   Medication Sig Start Date End Date Taking? Authorizing Provider  Acetaminophen (CHLORASEPTIC SORE THROAT PO) Take 4 sprays by mouth every 30 (thirty) minutes as needed (swollen gums and pain).   Yes [provider]  Aspirin-Caffeine (BAYER BACK & BODY PO) Take 4 tablets by mouth every 4 (four) hours as needed (pain). 500mg  tablets   Yes [provider]  ARIPiprazole (ABILIFY) 5 MG tablet Take 1/2 tab daily for 1 week and than full tab daily Patient not taking: Reported on 03/22/2018 11/16/17   Arfeen, Phillips Grout, MD  benzonatate (TESSALON) 100 MG capsule Take 1 capsule (100 mg total) by mouth every 8 (eight) hours. Patient not taking: Reported on 11/16/2017 11/15/17   Felicie Morn, NP  cyclobenzaprine (FLEXERIL) 10 MG tablet Take 1 tablet (10 mg total) by mouth 2 (two) times daily as needed for muscle spasms. 11/03/18   Dartha Lodge, PA-C  ibuprofen (ADVIL,MOTRIN) 600 MG tablet Take 1 tablet (600 mg total)  by mouth every 6 (six) hours as needed. 11/03/18   Dartha Lodge, PA-C  predniSONE (DELTASONE) 20 MG tablet Take 3 tablets (60 mg total) by mouth daily for 5 days. 11/03/18 11/08/18  Dartha Lodge, PA-C    Family History Family History  Problem Relation Age of Onset  . Depression Mother   . Bipolar disorder Sister     Social History Social History   Tobacco Use  . Smoking status: Never Smoker  . Smokeless tobacco: Never Used  Substance Use Topics  . Alcohol use: Yes    Alcohol/week: 1.0 - 2.0 standard drinks    Types: 1 - 2 Cans of beer per week    Comment: socially  . Drug use: No     Allergies   Hydrocodone-acetaminophen   Review of  Systems Review of Systems  Constitutional: Negative for chills and fever.  HENT: Negative.   Respiratory: Negative for shortness of breath.   Cardiovascular: Negative for chest pain.  Gastrointestinal: Negative for abdominal pain, constipation, diarrhea, nausea and vomiting.  Genitourinary: Negative for dysuria, flank pain, frequency and hematuria.  Musculoskeletal: Positive for back pain. Negative for arthralgias, gait problem, joint swelling, myalgias and neck pain.  Skin: Negative for color change, rash and wound.  Neurological: Negative for weakness and numbness.     Physical Exam Updated Vital Signs BP (!) 147/84 (BP Location: Right Arm)   Pulse 90   Temp 97.8 F (36.6 C) (Oral)   Resp 20   Ht 6' (1.829 m)   Wt 113.4 kg   LMP 10/07/2018   SpO2 99%   BMI 33.91 kg/m   Physical Exam Vitals signs and nursing note reviewed.  Constitutional:      General: She is not in acute distress.    Appearance: She is well-developed. She is not diaphoretic.  HENT:     Head: Atraumatic.     Mouth/Throat:     Mouth: Mucous membranes are moist.     Pharynx: Oropharynx is clear.  Eyes:     General:        Right eye: No discharge.        Left eye: No discharge.     Extraocular Movements: Extraocular movements intact.     Pupils: Pupils are equal, round, and reactive to light.  Neck:     Musculoskeletal: Neck supple.  Cardiovascular:     Pulses:          Radial pulses are 2+ on the right side and 2+ on the left side.       Dorsalis pedis pulses are 2+ on the right side and 2+ on the left side.       Posterior tibial pulses are 2+ on the right side and 2+ on the left side.  Pulmonary:     Effort: Pulmonary effort is normal. No respiratory distress.  Abdominal:     General: Bowel sounds are normal. There is no distension.     Palpations: Abdomen is soft. There is no mass.     Tenderness: There is no abdominal tenderness. There is no guarding.     Comments: Abdomen soft,  nondistended, nontender to palpation in all quadrants without guarding or peritoneal signs, no CVA tenderness bilaterally  Musculoskeletal:     Comments: Tenderness to palpation over right paraspinal muscles.  Pain made worse with range of motion of the lower extremities, positive straight leg raise on the right  Skin:    General: Skin is warm and dry.  Capillary Refill: Capillary refill takes less than 2 seconds.  Neurological:     Mental Status: She is alert and oriented to person, place, and time.     Comments: Alert, clear speech, following commands. CN III-VII intact Moving all extremities without difficulty. Bilateral lower extremities with 5/5 strength in proximal and distal muscle groups and with dorsi and plantar flexion. Sensation intact in bilateral lower extremities. 2+ patellar DTRs bilaterally. Ambulatory with steady gait  Psychiatric:        Mood and Affect: Mood normal.        Behavior: Behavior normal.      ED Treatments / Results  Labs (all labs ordered are listed, but only abnormal results are displayed) Labs Reviewed - No data to display  EKG None  Radiology No results found.  Procedures Procedures (including critical care time)  Medications Ordered in ED Medications  ketorolac (TORADOL) 30 MG/ML injection 30 mg (30 mg Intramuscular Given 11/03/18 1711)  oxyCODONE-acetaminophen (PERCOCET/ROXICET) 5-325 MG per tablet 1 tablet (1 tablet Oral Given 11/03/18 1710)  predniSONE (DELTASONE) tablet 60 mg (60 mg Oral Given 11/03/18 1709)  methocarbamol (ROBAXIN) tablet 500 mg (500 mg Oral Given 11/03/18 1709)     Initial Impression / Assessment and Plan / ED Course  I have reviewed the triage vital signs and the nursing notes.  Pertinent labs & imaging results that were available during my care of the patient were reviewed by me and considered in my medical decision making (see chart for details).  Normal neurological exam, no evidence of urinary incontinence  or retention, pain is consistently reproducible. There is no evidence of AAA or concern for dissection at this time.   Patient can walk but states is painful.  No loss of bowel or bladder control.  No concern for cauda equina.  No fever, night sweats, weight loss, h/o cancer, IVDU.  Pain treated here in the department with significant improvement and patient now ambulatory with steady gait.  Was evaluated for similar symptoms over 6 months ago and had reassuring imaging at that time with no new trauma or injury.  RICE protocol and pain medicine indicated and discussed with patient. I have also discussed reasons to return immediately to the ER.  Patient expresses understanding and agrees with plan.    Final Clinical Impressions(s) / ED Diagnoses   Final diagnoses:  Acute right-sided low back pain with right-sided sciatica    ED Discharge Orders         Ordered    ibuprofen (ADVIL,MOTRIN) 600 MG tablet  Every 6 hours PRN     11/03/18 1824    cyclobenzaprine (FLEXERIL) 10 MG tablet  2 times daily PRN     11/03/18 1824    predniSONE (DELTASONE) 20 MG tablet  Daily     11/03/18 1824           Legrand Rams 11/03/18 2237    Tilden Fossa, MD 11/04/18 2310

## 2018-11-03 NOTE — Discharge Instructions (Signed)
You were seen here today for Back Pain: Low back pain is discomfort in the lower back that may be due to injuries to muscles and ligaments around the spine. Occasionally, it may be caused by a problem to a part of the spine called a disc. Your back pain should be treated with medicines listed below as well as back exercises and this back pain should get better over the next 2 weeks. Most patients get completely well in 4 weeks. It is important to know however, if you develop severe or worsening pain, low back pain with fever, numbness, weakness or inability to walk or urinate, you should return to the ER immediately.  Please follow up with your doctor this week for a recheck if still having symptoms.  HOME INSTRUCTIONS Self - care:  The application of heat can help soothe the pain.  Maintaining your daily activities, including walking (this is encouraged), as it will help you get better faster than just staying in bed. Do not life, push, pull anything more than 10 pounds for the next week. I am attaching back exercises that you can do at home to help facilitate your recovery.   Back Exercises - I have attached a handout on back exercises that can be done at home to help facilitate your recovery.   Medications are also useful to help with pain control.   Acetaminophen.  This medication is generally safe, and found over the counter. Take as directed for your age. You should not take more than 8 of the extra strength (500mg ) pills a day (max dose is 4000mg  total OVER one day)  Salon pas lidocaine patches (blue and silver box)  Non steroidal anti inflammatory: This includes medications including Ibuprofen, naproxen and Mobic; These medications help both pain and swelling and are very useful in treating back pain.  They should be taken with food, as they can cause stomach upset, and more seriously, stomach bleeding. Do not combine the medications.   Muscle relaxants:  These medications can help with muscle  tightness that is a cause of lower back pain.  Most of these medications can cause drowsiness, and it is not safe to drive or use dangerous machinery while taking them. They are primarily helpful when taken at night before sleep.  Prednisone - This is an oral steroid.  This medication is best taken with food in the morning.  Please note that this medication can cause anxiety, mood swings, muscle fatigue, increased hunger, weight gain (sodium/fluid retention), poor sleep as well as other symptoms. If you are a diabetic, please monitor your blood sugars at home as this medication can increase your blood sugars. Call your pharmacist if you have any questions.  You will need to follow up with your primary healthcare provider or the Orthopedist in 1-2 weeks for reassessment and persistent symptoms.  Be aware that if you develop new symptoms, such as a fever, leg weakness, difficulty with or loss of control of your urine or bowels, abdominal pain, or more severe pain, you will need to seek medical attention and/or return to the Emergency department.  Additional Information:  Your vital signs today were: BP 123/66    Pulse 60    Temp 97.8 F (36.6 C) (Oral)    Resp 18    Ht 6' (1.829 m)    Wt 113.4 kg    LMP 10/07/2018    SpO2 100%    BMI 33.91 kg/m  If your blood pressure (BP) was elevated above  135/85 this visit, please have this repeated by your doctor within one month. ---------------

## 2018-11-03 NOTE — ED Notes (Signed)
ED Provider at bedside. 

## 2018-11-03 NOTE — ED Notes (Signed)
Patient verbalizes understanding of discharge instructions. Opportunity for questioning and answers were provided. Armband removed by staff, pt discharged from ED. Pt ambulatory to lobby. Pt reports she is unable to wait for VS due to having to catch bus. Prescriptions and pharmacy reviewed.

## 2019-01-06 ENCOUNTER — Other Ambulatory Visit: Payer: Self-pay

## 2019-01-06 ENCOUNTER — Emergency Department (HOSPITAL_COMMUNITY): Payer: Self-pay

## 2019-01-06 ENCOUNTER — Emergency Department (HOSPITAL_COMMUNITY)
Admission: EM | Admit: 2019-01-06 | Discharge: 2019-01-06 | Disposition: A | Discharge status: Home / Self Care | Payer: Self-pay | Attending: Emergency Medicine | Admitting: Emergency Medicine

## 2019-01-06 ENCOUNTER — Encounter (HOSPITAL_COMMUNITY): Payer: Self-pay | Admitting: Emergency Medicine

## 2019-01-06 DIAGNOSIS — R079 Chest pain, unspecified: Secondary | ICD-10-CM | POA: Insufficient documentation

## 2019-01-06 DIAGNOSIS — R1013 Epigastric pain: Secondary | ICD-10-CM | POA: Insufficient documentation

## 2019-01-06 DIAGNOSIS — J45909 Unspecified asthma, uncomplicated: Secondary | ICD-10-CM | POA: Insufficient documentation

## 2019-01-06 DIAGNOSIS — I10 Essential (primary) hypertension: Secondary | ICD-10-CM | POA: Insufficient documentation

## 2019-01-06 LAB — COMPREHENSIVE METABOLIC PANEL
ALT: 14 U/L (ref 0–44)
AST: 17 U/L (ref 15–41)
Albumin: 4 g/dL (ref 3.5–5.0)
Alkaline Phosphatase: 42 U/L (ref 38–126)
Anion gap: 10 (ref 5–15)
BUN: 11 mg/dL (ref 6–20)
CO2: 21 mmol/L — ABNORMAL LOW (ref 22–32)
Calcium: 9.3 mg/dL (ref 8.9–10.3)
Chloride: 104 mmol/L (ref 98–111)
Creatinine, Ser: 0.82 mg/dL (ref 0.44–1.00)
GFR calc Af Amer: >60 mL/min (ref >60)
GFR calc non Af Amer: >60 mL/min (ref >60)
Glucose, Bld: 88 mg/dL (ref 70–99)
Potassium: 3.8 mmol/L (ref 3.5–5.1)
Sodium: 135 mmol/L (ref 135–145)
Total Bilirubin: 0.3 mg/dL (ref 0.3–1.2)
Total Protein: 7.3 g/dL (ref 6.5–8.1)

## 2019-01-06 LAB — I-STAT BETA HCG BLOOD, ED (MC, WL, AP ONLY): I-stat hCG, quantitative: <5 m[IU]/mL (ref <5)

## 2019-01-06 LAB — URINALYSIS, ROUTINE W REFLEX MICROSCOPIC
Bilirubin Urine: NEGATIVE
Glucose, UA: NEGATIVE mg/dL
Hgb urine dipstick: NEGATIVE
Ketones, ur: NEGATIVE mg/dL
Leukocytes,Ua: NEGATIVE
Nitrite: NEGATIVE
Protein, ur: NEGATIVE mg/dL
Specific Gravity, Urine: 1.023 (ref 1.005–1.030)
pH: 6 (ref 5.0–8.0)

## 2019-01-06 LAB — CBC
HCT: 28 % — ABNORMAL LOW (ref 36.0–46.0)
Hemoglobin: 8 g/dL — ABNORMAL LOW (ref 12.0–15.0)
MCH: 20.8 pg — ABNORMAL LOW (ref 26.0–34.0)
MCHC: 28.6 g/dL — ABNORMAL LOW (ref 30.0–36.0)
MCV: 72.7 fL — ABNORMAL LOW (ref 80.0–100.0)
Platelets: 413 10*3/uL — ABNORMAL HIGH (ref 150–400)
RBC: 3.85 MIL/uL — ABNORMAL LOW (ref 3.87–5.11)
RDW: 18.7 % — ABNORMAL HIGH (ref 11.5–15.5)
WBC: 6.2 10*3/uL (ref 4.0–10.5)
nRBC: 0 % (ref 0.0–0.2)

## 2019-01-06 LAB — LIPASE, BLOOD: Lipase: 34 U/L (ref 11–51)

## 2019-01-06 LAB — TROPONIN I: Troponin I: <0.03 ng/mL (ref <0.03)

## 2019-01-06 MED ORDER — SODIUM CHLORIDE 0.9% FLUSH: 3.0000 mL | Freq: Once | INTRAVENOUS | Status: DC

## 2019-01-06 MED ORDER — ALUM & MAG HYDROXIDE-SIMETH 200-200-20 MG/5ML PO SUSP
30.0000 mL | Freq: Once | ORAL | Status: AC
  Administered 2019-01-06: 30 mL via ORAL
  Filled 2019-01-06: qty 30

## 2019-01-06 MED ORDER — LIDOCAINE VISCOUS HCL 2 % MT SOLN
15.0000 mL | Freq: Once | OROMUCOSAL | Status: AC
  Administered 2019-01-06: 15 mL via ORAL
  Filled 2019-01-06: qty 15

## 2019-01-06 MED ORDER — ONDANSETRON 4 MG PO TBDP: 4.0000 mg | ORAL_TABLET | Freq: Three times a day (TID) | ORAL | 0 refills | Status: DC | PRN

## 2019-01-06 MED ORDER — SUCRALFATE 1 G PO TABS: 1.0000 g | ORAL_TABLET | Freq: Four times a day (QID) | ORAL | 0 refills | Status: DC | PRN

## 2019-01-06 NOTE — ED Provider Notes (Signed)
[facility] Emergency Department Provider Note MRN:  [id number]  Arrival date & time: 01/06/19     Chief Complaint   Abdominal Pain and Chest Pain   History of Present Illness   Diane Rice is a 33 y.o. year-old female with a history of hypertension presenting to the ED with chief complaint of chest pain, abdominal pain.  3 days of worsening epigastric pain, described as burning, today and radiates to the central chest, also burning.  The pain is constant but is worsened after meals.  Patient endorsing intermittent nausea, few episodes of nonbloody nonbilious emesis.  Denies black stool or blood in stool, denies lower abdominal pain, vaginal bleeding or discharge.  Symptoms are mild to moderate in severity.  Review of Systems  A complete 10 system review of systems was obtained and all systems are negative except as noted in the HPI and PMH.   Patient's Health History    Past Medical History:  Diagnosis Date  . Anemia   . Asthma   . Back pain, chronic   . Hypertension     Past Surgical History:  Procedure Laterality Date  . MOLE REMOVAL    . WISDOM TOOTH EXTRACTION      Family History  Problem Relation Age of Onset  . Depression Mother   . Bipolar disorder Sister     Social History   Socioeconomic History  . Marital status: Married    Spouse name: jamal  . Number of children: 2  . Years of education: Not on file  . Highest education level: 9th grade  Occupational History  . Not on file  Social Needs  . Financial resource strain: Very hard  . Food insecurity:    Worry: Sometimes true    Inability: Sometimes true  . Transportation needs:    Medical: Yes    Non-medical: Yes  Tobacco Use  . Smoking status: Never Smoker  . Smokeless tobacco: Never Used  Substance and Sexual Activity  . Alcohol use: Yes    Alcohol/week: 1.0 - 2.0 standard drinks    Types: 1 - 2 Cans of beer per week    Comment: socially  . Drug use: No  . Sexual  activity: Yes    Birth control/protection: None  Lifestyle  . Physical activity:    Days per week: 0 days    Minutes per session: 0 min  . Stress: Very much  Relationships  . Social connections:    Talks on phone: Once a week    Gets together: Never    Attends religious service: More than 4 times per year    Active member of club or organization: No    Attends meetings of clubs or organizations: Never    Relationship status: Married  . Intimate partner violence:    Fear of current or ex partner: No    Emotionally abused: No    Physically abused: No    Forced sexual activity: No  Other Topics Concern  . Not on file  Social History Narrative  . Not on file     Physical Exam  Vital Signs and Nursing Notes reviewed Vitals:   01/06/19 2048  BP: 128/76  Pulse: 66  Resp: 16  Temp: 99 F (37.2 C)  SpO2: 100%    CONSTITUTIONAL: Well-appearing, NAD NEURO:  Alert and oriented x 3, no focal deficits EYES:  eyes equal and reactive ENT/NECK:  no LAD, no JVD CARDIO: Regular rate, well-perfused, normal S1 and S2 PULM:  CTAB no wheezing or rhonchi GI/GU:  normal bowel sounds, non-distended, non-tender MSK/SPINE:  No gross deformities, no edema SKIN:  no rash, atraumatic PSYCH:  Appropriate speech and behavior  Diagnostic and Interventional Summary    EKG Interpretation  Date/Time:  Saturday Jan 06 2019 20:55:30 EDT Ventricular Rate:  64 PR Interval:  164 QRS Duration: 78 QT Interval:  390 QTC Calculation: 402 R Axis:   45 Text Interpretation:  Normal sinus rhythm Normal ECG Confirmed by Kennis CarinaBero, Nieshia Larmon [phone]([id number]) on 01/06/2019 9:25:46 PM      Labs Reviewed  COMPREHENSIVE METABOLIC PANEL - Abnormal; Notable for the following components:      Result Value   CO2 21 (*)    All other components within normal limits  CBC - Abnormal; Notable for the following components:   RBC 3.85 (*)    Hemoglobin 8.0 (*)    HCT 28.0 (*)    MCV 72.7 (*)    MCH 20.8 (*)    MCHC 28.6 (*)     RDW 18.7 (*)    Platelets 413 (*)    All other components within normal limits  LIPASE, BLOOD  URINALYSIS, ROUTINE W REFLEX MICROSCOPIC  TROPONIN I  I-STAT BETA HCG BLOOD, ED ([facility], [facility], [facility] ONLY)    DG Chest 2 View  Final Result      Medications  sodium chloride flush (NS) 0.9 % injection 3 mL (has no administration in time range)  alum & mag hydroxide-simeth (MAALOX/MYLANTA) 200-200-20 MG/5ML suspension 30 mL (30 mLs Oral Given 01/06/19 2124)    And  lidocaine (XYLOCAINE) 2 % viscous mouth solution 15 mL (15 mLs Oral Given 01/06/19 2124)     Procedures Critical Care  ED Course and Medical Decision Making  I have reviewed the triage vital signs and the nursing notes.  Pertinent labs & imaging results that were available during my care of the patient were reviewed by me and considered in my medical decision making (see below for details).  Favoring GERD or gastritis in this 33 year old female, also considering pancreatitis, unlikely to be biliary etiology given the lack of right upper quadrant tenderness and the radiation to the chest.  Labs pending, GI cocktail.  Currently without indication for abdominal imaging.  EKG is unremarkable.  Little to no concern for ACS or PE, PERC negative.  EKG and chest x-ray are unremarkable.  Labs are overall reassuring, no elevation in LFTs or bilirubin to suggest biliary pathology.  Patient does have anemia which is chronic for her, advised that she restart her on iron supplement.  Suspect either GERD or gastroenteritis or MSK chest pain but no emergent cause today, appropriate for discharge.  After the discussed management above, the patient was determined to be safe for discharge.  The patient was in agreement with this plan and all questions regarding their care were answered.  ED return precautions were discussed and the patient will return to the ED with any significant worsening of condition.  Elmer SowMichael M. Pilar PlateBero, MD [facility]  [facility] [email or web address]  Final Clinical Impressions(s) / ED Diagnoses     ICD-10-CM   1. Epigastric pain R10.13   2. Chest pain R07.9 DG Chest 2 View    DG Chest 2 View    ED Discharge Orders         Ordered    ondansetron (ZOFRAN ODT) 4 MG disintegrating tablet  Every 8 hours PRN     01/06/19 2230    sucralfate (CARAFATE)  1 g tablet  4 times daily PRN     01/06/19 2230             Sabas Sous, MD 01/06/19 2231

## 2019-01-06 NOTE — ED Triage Notes (Signed)
Pt reports abdominal pain X3 days, usually after eating.  States pain is moving into back and chest.

## 2019-01-06 NOTE — Discharge Instructions (Signed)
You were evaluated in the Emergency Department and after careful evaluation, we did not find any emergent condition requiring admission or further testing in the hospital.  Your symptoms today seem to be due to inflammation of the lining of the stomach.  Please use medications provided as needed.  Tylenol will also be a good medicine use for discomfort.  Please return to the Emergency Department if you experience any worsening of your condition.  We encourage you to follow up with a primary care provider.  Thank you for allowing [place] to be a part of your care.

## 2019-04-09 ENCOUNTER — Ambulatory Visit: Payer: Self-pay | Admitting: *Deleted

## 2019-04-09 ENCOUNTER — Inpatient Hospital Stay (HOSPITAL_COMMUNITY)
Admission: AD | Admit: 2019-04-09 | Discharge: 2019-04-09 | Disposition: A | Discharge status: Home / Self Care | Payer: Self-pay | Attending: Obstetrics & Gynecology | Admitting: Obstetrics & Gynecology

## 2019-04-09 ENCOUNTER — Other Ambulatory Visit: Payer: Self-pay

## 2019-04-09 DIAGNOSIS — N912 Amenorrhea, unspecified: Secondary | ICD-10-CM | POA: Insufficient documentation

## 2019-04-09 DIAGNOSIS — N926 Irregular menstruation, unspecified: Secondary | ICD-10-CM

## 2019-04-09 LAB — POCT PREGNANCY, URINE: Preg Test, Ur: NEGATIVE

## 2019-04-09 NOTE — Telephone Encounter (Signed)
Patient is having cycle 3 days late.light pink discharge then 2 day, now she is having dark discharge. Patient reports cramping. UPT- neg. Patient is having weakness and dizziness. Patient is not on birth control. Patient is sexually active with same partner 11 years. Patient does have a history of anemia. Patient states she does not have gyn that she sees- advised ED for evaluation of weakness and dizziness  Reason for Disposition . Patient sounds very sick or weak to the triager  Answer Assessment - Initial Assessment Questions 1. LMP:  "When did your last menstrual period begin?"     7/12-17 2. DAYS LATE: "How many days late is your menstrual period?"     3 days late- then started spotting 8/6 3. REGULARITY: "How regular are your periods?"     regular 4. PREGNANCY: "Is there any chance you are pregnant?" (e.g., unprotected intercourse, missed birth control pill, broken condom) "Have you used a home pregnancy test?"     Possible- but UPT negative 5. BREASTFEEDING: "Are you breastfeeding?"    no 6. BIRTH CONTROL PILLS: "Are you taking birth control pills, or have you stopped recently?"     no 7. DEPOPROVERA: "Has your doctor given you a shot to prevent pregnancy?" (e.g., Depoprovera injection)     no 8. CAUSE: "What do you think caused the missed period?" (e.g., stress, rapid weight loss, excessive exercise)     No- patient has been trying to lose weight 9. OTHER SYMPTOMS: "Do you have any other symptoms?" (e.g., abdominal pain)     Cramping and dizziness, fatigue  Protocols used: ABDOMINAL PAIN - FEMALE-A-AH, MENSTRUAL PERIOD - MISSED OR LATE-A-AH

## 2019-04-09 NOTE — MAU Provider Note (Signed)
First Provider Initiated Contact with Patient 04/09/19 1455      S [name] is a 33 y.o. No obstetric history on file. non-pregnant female who presents to MAU today with complaint of missed menses.   O BP 138/87 (BP Location: Right Arm)   Pulse 70   Temp 98.6 F (37 C) (Oral)   Resp 18   Wt 124.1 kg   LMP 03/11/2019   SpO2 98%   BMI 37.11 kg/m  Physical Exam  Constitutional: She is oriented to person, place, and time. She appears well-developed and well-nourished. No distress.  Neurological: She is alert and oriented to person, place, and time.  Skin: She is not diaphoretic.    A Non pregnant female Medical screening exam complete   P Discharge from MAU in stable condition Patient given the option of transfer to [facility] for further evaluation or seek care in outpatient facility of choice List of options for follow-up given  Warning signs for worsening condition that would warrant emergency follow-up discussed Patient may return to MAU as needed for pregnancy related complaints  Rasch, Artist Pais, NP 04/09/2019 2:56 PM

## 2019-04-09 NOTE — MAU Note (Signed)
Since July 27-29 was going through issues. Had a white d/c then it became creamy.  Thought it was a yeast infection, so did a monistat treatment.  Then was bloated and nauseous. Was supposed to get cycle on 8/6, only had like a 'pink lemonade' d/c was watery then thickened and became reddish brown on 8/9. This morning was little bit heavier and having some cramping.  But nothing like it usually is.  Neg UPT last night, 'default' test this morning.  Called in and was told to come in.

## 2019-04-09 NOTE — MAU Note (Signed)
Unable to urinate, had jut gone prior to triage.  Water given.

## 2019-04-09 NOTE — MAU Note (Signed)
Not in lobby when called 

## 2019-06-26 ENCOUNTER — Encounter (HOSPITAL_COMMUNITY): Payer: Self-pay

## 2019-06-26 ENCOUNTER — Other Ambulatory Visit: Payer: Self-pay

## 2019-06-26 ENCOUNTER — Ambulatory Visit (HOSPITAL_COMMUNITY)
Admission: EM | Admit: 2019-06-26 | Discharge: 2019-06-26 | Disposition: A | Discharge status: Home / Self Care | Payer: Self-pay | Attending: Family Medicine | Admitting: Family Medicine

## 2019-06-26 DIAGNOSIS — Z3202 Encounter for pregnancy test, result negative: Secondary | ICD-10-CM

## 2019-06-26 DIAGNOSIS — G8929 Other chronic pain: Secondary | ICD-10-CM | POA: Insufficient documentation

## 2019-06-26 DIAGNOSIS — M549 Dorsalgia, unspecified: Secondary | ICD-10-CM | POA: Insufficient documentation

## 2019-06-26 DIAGNOSIS — N912 Amenorrhea, unspecified: Secondary | ICD-10-CM | POA: Insufficient documentation

## 2019-06-26 DIAGNOSIS — H5713 Ocular pain, bilateral: Secondary | ICD-10-CM | POA: Insufficient documentation

## 2019-06-26 DIAGNOSIS — Z202 Contact with and (suspected) exposure to infections with a predominantly sexual mode of transmission: Secondary | ICD-10-CM | POA: Insufficient documentation

## 2019-06-26 DIAGNOSIS — Z113 Encounter for screening for infections with a predominantly sexual mode of transmission: Secondary | ICD-10-CM

## 2019-06-26 LAB — POCT PREGNANCY, URINE: Preg Test, Ur: NEGATIVE

## 2019-06-26 MED ORDER — NAPROXEN 500 MG PO TABS: 500.0000 mg | ORAL_TABLET | Freq: Two times a day (BID) | ORAL | 0 refills | Status: DC

## 2019-06-26 MED ORDER — OLOPATADINE HCL 0.2 % OP SOLN: 1.0000 [drp] | Freq: Every day | OPHTHALMIC | 0 refills | Status: DC

## 2019-06-26 MED ORDER — TIZANIDINE HCL 4 MG PO TABS: 4.0000 mg | ORAL_TABLET | Freq: Four times a day (QID) | ORAL | 0 refills | Status: DC | PRN

## 2019-06-26 NOTE — ED Provider Notes (Signed)
[place]    CSN: [id number] Arrival date & time: 06/26/19  0944      History   Chief Complaint Chief Complaint  Patient presents with  . SEXUALLY TRANSMITTED DISEASE  . back spasms    HPI Joesphine Rice is a 33 y.o. female.   HPI  Patient is here with multiple complaints.  She does not currently have health insurance.  She does not currently have a PCP.  She has been having back pain for over a year.  Chronic low back pain.  Worse some days than others.  Some days she has muscle spasm.  Some days it is in the center of her low back.  She states is causing pain that goes into both legs.  Any activity makes the pain worse.  No numbness or weakness in the legs.  No bowel or bladder complaint.  No back injury.  She did have a back x-ray done just over a year ago.  It was negative. Patient also would like to be checked for STDs.  She is having unprotected sexual relations.  She states her period is late.  She would like to have a pregnancy test. She indicates that she has a strange sensation in her eyes.  She states that "it feels like something is crawling in them.  No visual acuity change.  No eye redness.  This has been going on for weeks.  Sometimes has increased tearing in her eyes.  Sometimes has increased mucus in her eyes.  Past Medical History:  Diagnosis Date  . Anemia   . Asthma   . Back pain, chronic   . Hypertension     Patient Active Problem List   Diagnosis Date Noted  . OBESITY 03/19/2010  . ANEMIA, MILD 03/19/2010  . BACK PAIN 03/19/2010  . VITAMIN D DEFICIENCY 01/01/2010  . 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 DEPRESSION 12/30/2009  . CHRONIC PAIN SYNDROME 12/30/2009  . VIRAL URI 12/30/2009  . ASTHMA 12/30/2009    Past Surgical History:  Procedure Laterality Date  . MOLE REMOVAL    . WISDOM TOOTH EXTRACTION      OB History   No obstetric history on file.      Home Medications    Prior to Admission medications   Medication  Sig Start Date End Date Taking? Authorizing Provider  Aspirin-Caffeine (BAYER BACK & BODY PO) Take 4 tablets by mouth every 4 (four) hours as needed (pain). 500mg  tablets    [provider]  ibuprofen (ADVIL) 200 MG tablet Take 400 mg by mouth every 6 (six) hours as needed for headache or moderate pain.    [provider]  naproxen (NAPROSYN) 500 MG tablet Take 1 tablet (500 mg total) by mouth 2 (two) times daily. 06/26/19   Raylene Everts, MD  Olopatadine HCl 0.2 % SOLN Apply 1 drop to eye daily. 06/26/19   Raylene Everts, MD  tiZANidine (ZANAFLEX) 4 MG tablet Take 1-2 tablets (4-8 mg total) by mouth every 6 (six) hours as needed for muscle spasms. 06/26/19   Raylene Everts, MD  ARIPiprazole (ABILIFY) 5 MG tablet Take 1/2 tab daily for 1 week and than full tab daily Patient not taking: Reported on 03/22/2018 11/16/17 06/26/19  Arfeen, Arlyce Harman, MD  sucralfate (CARAFATE) 1 g tablet Take 1 tablet (1 g total) by mouth 4 (four) times daily as needed. 01/06/19 06/26/19  Maudie Flakes, MD    Family History Family History  Problem Relation Age of  Onset  . Depression Mother   . Bipolar disorder Sister     Social History Social History   Tobacco Use  . Smoking status: Never Smoker  . Smokeless tobacco: Never Used  Substance Use Topics  . Alcohol use: Yes    Alcohol/week: 1.0 - 2.0 standard drinks    Types: 1 - 2 Cans of beer per week    Comment: socially  . Drug use: No     Allergies   Hydrocodone-acetaminophen   Review of Systems Review of Systems  Constitutional: Negative for chills and fever.  HENT: Negative for ear pain and sore throat.   Eyes: Positive for discharge and itching. Negative for pain and visual disturbance.  Respiratory: Negative for cough and shortness of breath.   Cardiovascular: Negative for chest pain and palpitations.  Gastrointestinal: Negative for abdominal pain and vomiting.  Genitourinary: Positive for menstrual problem.  Negative for dysuria, flank pain, frequency, hematuria and vaginal discharge.  Musculoskeletal: Positive for back pain. Negative for arthralgias.  Skin: Negative for color change and rash.  Neurological: Negative for seizures and syncope.  All other systems reviewed and are negative.    Physical Exam Triage Vital Signs ED Triage Vitals  Enc Vitals Group     BP 06/26/19 1027 113/88     Pulse Rate 06/26/19 1027 65     Resp 06/26/19 1027 18     Temp 06/26/19 1027 98.2 F (36.8 C)     Temp Source 06/26/19 1027 Oral     SpO2 06/26/19 1027 100 %     Weight 06/26/19 1029 260 lb (117.9 kg)     Height --      Head Circumference --      Peak Flow --      Pain Score 06/26/19 1029 6     Pain Loc --      Pain Edu? --      Excl. in GC? --    No data found.  Updated Vital Signs BP 113/88 (BP Location: Right Arm)   Pulse 65   Temp 98.2 F (36.8 C) (Oral)   Resp 18   Wt 117.9 kg   LMP 05/31/2019   SpO2 100%   BMI 35.26 kg/m      Physical Exam Nursing note reviewed.  Constitutional:      General: She is not in acute distress.    Appearance: She is well-developed. She is obese. She is not ill-appearing.  HENT:     Head: Normocephalic and atraumatic.     Mouth/Throat:     Comments: Mask in place Eyes:     Conjunctiva/sclera: Conjunctivae normal.     Pupils: Pupils are equal, round, and reactive to light.     Comments: Eyes are clear.  No injection.  No discharge.  Pupils are equal and reactive.  Neck:     Musculoskeletal: Normal range of motion and neck supple. No muscular tenderness.  Cardiovascular:     Rate and Rhythm: Normal rate and regular rhythm.     Heart sounds: Normal heart sounds.  Pulmonary:     Effort: Pulmonary effort is normal. No respiratory distress.     Breath sounds: Normal breath sounds.  Abdominal:     General: There is no distension.     Palpations: Abdomen is soft.  Musculoskeletal: Normal range of motion.     Comments: Tenderness centrally over  the 345 region.  No palpable muscle spasm or tenderness.  No tenderness over the L5-S1 junction or posterior  pelvis.  Very slow range of motion, about 50% of normal.  Strength sensation range of motion and reflexes are normal in both lower extremities.  Straight leg raise is fully negative bilaterally.  Skin:    General: Skin is warm and dry.  Neurological:     Mental Status: She is alert.      UC Treatments / Results  Labs (all labs ordered are listed, but only abnormal results are displayed) Labs Reviewed  POC URINE PREG, ED  CERVICOVAGINAL ANCILLARY ONLY    EKG   Radiology No results found.  Procedures Procedures (including critical care time)  Medications Ordered in UC Medications - No data to display  Initial Impression / Assessment and Plan / UC Course  I have reviewed the triage vital signs and the nursing notes.  Pertinent labs & imaging results that were available during my care of the patient were reviewed by me and considered in my medical decision making (see chart for details).    Pregnancy test is negative Chronic mechanical back pain.  Likely needs physical therapy and back education. Unprotected sexual relations, high risk sexual behavior.  We will test for STDs and pregnancy. Eye discomfort.  Unclear etiology.  We will give an allergy eyedrop and observe. Need for PCP is reviewed with patient. Final Clinical Impressions(s) / UC Diagnoses   Final diagnoses:  Possible exposure to STD  Amenorrhea  Chronic back pain greater than 3 months duration  Discomfort of both eyes   Discharge Instructions   None    ED Prescriptions    Medication Sig Dispense Auth. Provider   Olopatadine HCl 0.2 % SOLN Apply 1 drop to eye daily. 2.5 mL Eustace Moore, MD   naproxen (NAPROSYN) 500 MG tablet Take 1 tablet (500 mg total) by mouth 2 (two) times daily. 30 tablet Eustace Moore, MD   tiZANidine (ZANAFLEX) 4 MG tablet Take 1-2 tablets (4-8 mg total) by mouth  every 6 (six) hours as needed for muscle spasms. 21 tablet Eustace Moore, MD     PDMP not reviewed this encounter.   Eustace Moore, MD 06/26/19 [phone]

## 2019-06-26 NOTE — ED Triage Notes (Addendum)
Pt states she want to do STD screening and she has back spasms x 1 month.

## 2019-06-26 NOTE — Discharge Instructions (Signed)
I recommend that you see a primary care doctor. The primary care doctor can refer you to a spine specialist We will call you if any of your tests are positive. Use the eyedrops daily.  This is an allergy eyedrop but should still help with the discomfort Take Naprosyn twice a day with food.  This is an anti-inflammatory pain medication. Take tizanidine as needed as muscle relaxer. Use ice or heat to back.

## 2019-06-27 LAB — CERVICOVAGINAL ANCILLARY ONLY
Chlamydia: NEGATIVE
Neisseria Gonorrhea: NEGATIVE
Trichomonas: NEGATIVE

## 2019-06-29 ENCOUNTER — Telehealth (HOSPITAL_COMMUNITY): Payer: Self-pay | Admitting: Emergency Medicine

## 2019-06-29 NOTE — Telephone Encounter (Signed)
Patient called asking about test results, stds reported to be negative. Pt upset that we did not test her for BV or yeast. Provider and nursing note state std testing only. Pt given option to return to be reseen.

## 2019-08-30 ENCOUNTER — Ambulatory Visit: Payer: Self-pay | Attending: Internal Medicine

## 2019-08-30 DIAGNOSIS — Z20828 Contact with and (suspected) exposure to other viral communicable diseases: Secondary | ICD-10-CM | POA: Insufficient documentation

## 2019-08-30 DIAGNOSIS — Z20822 Contact with and (suspected) exposure to covid-19: Secondary | ICD-10-CM

## 2019-08-31 LAB — NOVEL CORONAVIRUS, NAA: SARS-CoV-2, NAA: NOT DETECTED

## 2019-12-25 ENCOUNTER — Emergency Department (HOSPITAL_COMMUNITY)
Admission: EM | Admit: 2019-12-25 | Discharge: 2019-12-25 | Disposition: A | Discharge status: Home / Self Care | Payer: Self-pay | Attending: Emergency Medicine | Admitting: Emergency Medicine

## 2019-12-25 ENCOUNTER — Emergency Department (HOSPITAL_BASED_OUTPATIENT_CLINIC_OR_DEPARTMENT_OTHER): Payer: Self-pay

## 2019-12-25 ENCOUNTER — Emergency Department (HOSPITAL_COMMUNITY): Payer: Self-pay

## 2019-12-25 ENCOUNTER — Encounter (HOSPITAL_COMMUNITY): Payer: Self-pay | Admitting: Emergency Medicine

## 2019-12-25 ENCOUNTER — Other Ambulatory Visit: Payer: Self-pay

## 2019-12-25 DIAGNOSIS — E669 Obesity, unspecified: Secondary | ICD-10-CM | POA: Insufficient documentation

## 2019-12-25 DIAGNOSIS — M79609 Pain in unspecified limb: Secondary | ICD-10-CM

## 2019-12-25 DIAGNOSIS — M7989 Other specified soft tissue disorders: Secondary | ICD-10-CM

## 2019-12-25 DIAGNOSIS — G8929 Other chronic pain: Secondary | ICD-10-CM | POA: Insufficient documentation

## 2019-12-25 DIAGNOSIS — M5441 Lumbago with sciatica, right side: Secondary | ICD-10-CM | POA: Insufficient documentation

## 2019-12-25 DIAGNOSIS — M25461 Effusion, right knee: Secondary | ICD-10-CM | POA: Insufficient documentation

## 2019-12-25 DIAGNOSIS — I10 Essential (primary) hypertension: Secondary | ICD-10-CM | POA: Insufficient documentation

## 2019-12-25 DIAGNOSIS — Z6837 Body mass index (BMI) 37.0-37.9, adult: Secondary | ICD-10-CM | POA: Insufficient documentation

## 2019-12-25 MED ORDER — PREDNISONE 10 MG (21) PO TBPK: ORAL_TABLET | ORAL | 0 refills | Status: DC

## 2019-12-25 MED ORDER — DICLOFENAC SODIUM 1 % EX GEL: 4.0000 g | Freq: Four times a day (QID) | CUTANEOUS | 2 refills | Status: DC

## 2019-12-25 MED ORDER — METHOCARBAMOL 750 MG PO TABS: 750.0000 mg | ORAL_TABLET | Freq: Two times a day (BID) | ORAL | 0 refills | Status: DC | PRN

## 2019-12-25 NOTE — ED Notes (Signed)
Patient transported to xray; Son has accompanied patient due to him being a minor-Monique,RN

## 2019-12-25 NOTE — ED Triage Notes (Signed)
Pt reports muscle spasms on legs, hips and ankles.

## 2019-12-25 NOTE — Progress Notes (Signed)
Right lower extremity venous duplex complete.  Please see CV Proc tab for preliminary results.  Levin Bacon- RDMS, RVT 7:12 PM  12/25/2019

## 2019-12-25 NOTE — Discharge Instructions (Addendum)
You have been seen today for leg and knee pain. There were no acute abnormalities on the x-rays, including no sign of fracture or dislocation, however, there could be injuries to the soft tissues, such as the ligaments or tendons that are not seen on xrays. There could also be what are called occult fractures that are small fractures not seen on xray. Antiinflammatory medications: Take 600 mg of ibuprofen every 6 hours or 440 mg (over the counter dose) to 500 mg (prescription dose) of naproxen every 12 hours for the next 3 days. After this time, these medications may be used as needed for pain. Take these medications with food to avoid upset stomach. Choose only one of these medications, do not take them together. Acetaminophen (generic for Tylenol): Should you continue to have additional pain while taking the ibuprofen or naproxen, you may add in acetaminophen as needed. Your daily total maximum amount of acetaminophen from all sources should be limited to 4000mg /day for persons without liver problems, or 2000mg /day for those with liver problems. Diclofenac gel: This is a topical anti-inflammatory medication and can be applied directly to the painful region.  Do not use on the face or genitals.  This medication may be used as an alternative to oral anti-inflammatory medications, such as ibuprofen or naproxen. Prednisone: Take the prednisone, as prescribed, until finished. If you are a diabetic, please know prednisone can raise your blood sugar temporarily. Methocarbamol: Methocarbamol (generic for Robaxin) is a muscle relaxer and can help relieve stiff muscles or muscle spasms.  Do not drive or perform other dangerous activities while taking this medication as it can cause drowsiness as well as changes in reaction time and judgement. Ice: May apply ice to the area over the next 24 hours for 15 minutes at a time to reduce swelling. Elevation: Keep the extremity elevated as often as possible to reduce pain and  inflammation. Support: Wear the ACE wrap for support and comfort. Wear this until pain resolves. You will be weight-bearing as tolerated, which means you can slowly start to put weight on the extremity and increase amount and frequency as pain allows. Follow up: If symptoms are improving, you may follow up with your primary care provider for any continued management. If symptoms are not starting to improve within a week, you should follow up with the orthopedic specialist within two weeks. Return: Return to the ED for numbness, weakness, increasing pain, overall worsening symptoms, loss of function, or if symptoms are not improving, you have tried to follow up with the orthopedic specialist, and have been unable to do so.  For prescription assistance, may try using prescription discount sites or apps, such as goodrx.com or Good Rx smart phone app.

## 2019-12-25 NOTE — ED Notes (Signed)
Diane Rice, call him at [phone] for updates on this patient.

## 2019-12-25 NOTE — ED Provider Notes (Signed)
[facility] EMERGENCY DEPARTMENT Provider Note   CSN: [id number] Arrival date & time: 12/25/19  1622     History Chief Complaint  Patient presents with  . Spasms    Diane Rice is a 34 y.o. female with past medical history significant for chronic back pain, morbid obesity, small knee effusions bilaterally, and anemia who presents to the ED with a 1 week history of worsening low back pain with right-sided leg pain.  She is also endorsing swelling of her right lower extremity in addition to discomfort in her popliteal region.  She states that her low back and right leg discomfort is so significant that she has required assistance from her children in order to ambulate.  She has never been evaluated by an orthopedist and she cites insurance reasons.  She denies any recent illness or infection, fevers or chills, chest pain or difficulty breathing, pleuritic cough reported chest discomfort, hx of malignancy, hx of IVDA, abdominal pain, nausea or vomiting, or other symptoms.   HPI     Past Medical History:  Diagnosis Date  . Anemia   . Asthma   . Back pain, chronic   . Hypertension     Patient Active Problem List   Diagnosis Date Noted  . OBESITY 03/19/2010  . ANEMIA, MILD 03/19/2010  . BACK PAIN 03/19/2010  . VITAMIN D DEFICIENCY 01/01/2010  . 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 DEPRESSION 12/30/2009  . CHRONIC PAIN SYNDROME 12/30/2009  . VIRAL URI 12/30/2009  . ASTHMA 12/30/2009    Past Surgical History:  Procedure Laterality Date  . MOLE REMOVAL    . WISDOM TOOTH EXTRACTION       OB History   No obstetric history on file.     Family History  Problem Relation Age of Onset  . Depression Mother   . Bipolar disorder Sister     Social History   Tobacco Use  . Smoking status: Never Smoker  . Smokeless tobacco: Never Used  Substance Use Topics  . Alcohol use: Yes    Alcohol/week: 1.0 - 2.0 standard drinks    Types: 1 - 2 Cans of beer  per week    Comment: socially  . Drug use: No    Home Medications Prior to Admission medications   Medication Sig Start Date End Date Taking? Authorizing Provider  Aspirin-Caffeine (BAYER BACK & BODY PO) Take 4 tablets by mouth every 4 (four) hours as needed (pain). 500mg  tablets    [provider]  ibuprofen (ADVIL) 200 MG tablet Take 400 mg by mouth every 6 (six) hours as needed for headache or moderate pain.    [provider]  naproxen (NAPROSYN) 500 MG tablet Take 1 tablet (500 mg total) by mouth 2 (two) times daily. 06/26/19   06/28/19, MD  Olopatadine HCl 0.2 % SOLN Apply 1 drop to eye daily. 06/26/19   06/28/19, MD  tiZANidine (ZANAFLEX) 4 MG tablet Take 1-2 tablets (4-8 mg total) by mouth every 6 (six) hours as needed for muscle spasms. 06/26/19   06/28/19, MD  ARIPiprazole (ABILIFY) 5 MG tablet Take 1/2 tab daily for 1 week and than full tab daily Patient not taking: Reported on 03/22/2018 11/16/17 06/26/19  Arfeen, 06/28/19, MD  sucralfate (CARAFATE) 1 g tablet Take 1 tablet (1 g total) by mouth 4 (four) times daily as needed. 01/06/19 06/26/19  06/28/19, MD    Allergies    Hydrocodone-acetaminophen  Review of Systems  Review of Systems  Constitutional: Negative for fever.  Respiratory: Negative for shortness of breath.   Cardiovascular: Positive for leg swelling. Negative for chest pain and palpitations.  Musculoskeletal: Positive for arthralgias, back pain, gait problem and joint swelling.  Skin: Negative for color change and wound.  Hematological: Does not bruise/bleed easily.    Physical Exam Updated Vital Signs BP 115/65 (BP Location: Right Arm)   Pulse 68   Temp 98.8 F (37.1 C) (Oral)   Resp 20   Ht 6' (1.829 m)   Wt 126.6 kg   SpO2 96%   BMI 37.84 kg/m   Physical Exam Vitals and nursing note reviewed. Exam conducted with a chaperone present.  Constitutional:      Appearance: Normal appearance.  HENT:       Head: Normocephalic and atraumatic.  Eyes:     General: No scleral icterus.    Conjunctiva/sclera: Conjunctivae normal.  Cardiovascular:     Rate and Rhythm: Normal rate and regular rhythm.     Pulses: Normal pulses.     Heart sounds: Normal heart sounds.  Pulmonary:     Effort: Pulmonary effort is normal. No respiratory distress.     Breath sounds: Normal breath sounds.  Musculoskeletal:     Cervical back: Normal range of motion. No rigidity.     Comments: No significant cervical, thoracic, or lumbar midline tenderness to palpation.  No overlying skin changes.  Tenderness to palpation over right SI joint.  Positive right-sided SLR with radiation into buttock.  Negative left-sided SLR. Right knee: Mildly swollen when compared to left side.  Tenderness in popliteal region.  No obvious Baker's cyst appreciated.  No redness, warmth, or other overlying skin changes.  Able to flex and extend, albeit with some discomfort.   Right hip: Normal.  No tenderness to palpation.  Back pain elicited with hip flexion. Right ankle: Normal.  Plantar flexion and dorsiflexion intact.  Pedal pulse intact. Compartments are soft throughout.    Skin:    General: Skin is dry.     Capillary Refill: Capillary refill takes less than 2 seconds.  Neurological:     Mental Status: She is alert.     GCS: GCS eye subscore is 4. GCS verbal subscore is 5. GCS motor subscore is 6.  Psychiatric:        Mood and Affect: Mood normal.        Behavior: Behavior normal.        Thought Content: Thought content normal.     ED Results / Procedures / Treatments   Labs (all labs ordered are listed, but only abnormal results are displayed) Labs Reviewed - No data to display  EKG None  Radiology No results found.  Procedures Procedures (including critical care time)  Medications Ordered in ED Medications - No data to display  ED Course  I have reviewed the triage vital signs and the nursing notes.  Pertinent  labs & imaging results that were available during my care of the patient were reviewed by me and considered in my medical decision making (see chart for details).    MDM Rules/Calculators/A&P                      Patient's history and physical exam is consistent with back pain with right-sided radicular symptoms.  She endorses a history of chronic low back pain, but states that she has never experienced the radiation into her buttock and leg.  No fevers, history of  IVDA, history of malignancy, and she has been able to ambulate, albeit antalgic.    She is also endorsing some mild leg swelling over same period of time, however it is mild on my examination.  Given atraumatic leg swelling, DVT study is appropriate.  The majority of her leg discomfort is located in her right knee, but she also cites chronic small effusions in her knees bilaterally.  We will obtain plain films of her right knee to evaluate for effusion or other acute pathology, but wanted to hold off until after DVT study was performed given that they are leaving at 7 PM.  She was able to demonstrate flexion and extension of the with discomfort and she has no fevers, erythema, warmth, or other overlying skin changes that might otherwise be concerned for a septic arthritis.  I also have low suspicion for a gouty or pseudogout arthritis.  Her pedal pulse is intact and her compartments are soft throughout.  Given patient's feelings of knee instability, feel as though knee sleeve is appropriate.    At shift change care was transferred to [facility], PA-C who will follow pending studies, re-evaluate, and determine disposition.     Final Clinical Impression(s) / ED Diagnoses Final diagnoses:  Swelling of knee joint, right  Chronic right-sided low back pain with right-sided sciatica    Rx / DC Orders ED Discharge Orders    None       Corena Herter, PA-C 12/25/19 1858    Hayden Rasmussen, MD 12/26/19 1037

## 2019-12-25 NOTE — ED Provider Notes (Signed)
Diane Rice is a 34 y.o. female,  presenting to the ED with pain to the right knee, upper leg, as well as lower back pain.  She does note she has had recurrence of lower back pain as well as lower extremity pain and tingling for years. She also complains of swelling in the right knee, which she has had in the past. Denies fever, numbness, falls/trauma, weakness, changes in bowel or bladder function, saddle anesthesias, or any other complaints.  HPI from Diane Leyden, PA-C: "Diane Rice is a 34 y.o. female with past medical history significant for chronic back pain, morbid obesity, small knee effusions bilaterally, and anemia who presents to the ED with a 1 week history of worsening low back pain with right-sided leg pain.  She is also endorsing swelling of her right lower extremity in addition to discomfort in her popliteal region.  She states that her low back and right leg discomfort is so significant that she has required assistance from her children in order to ambulate.  She has never been evaluated by an orthopedist and she cites insurance reasons.  She denies any recent illness or infection, fevers or chills, chest pain or difficulty breathing, pleuritic cough reported chest discomfort, hx of malignancy, hx of IVDA, abdominal pain, nausea or vomiting, or other symptoms."  Past Medical History:  Diagnosis Date  . Anemia   . Asthma   . Back pain, chronic   . Hypertension     Physical Exam  BP 115/65 (BP Location: Right Arm)   Pulse 68   Temp 98.8 F (37.1 C) (Oral)   Resp 20   Ht 6' (1.829 m)   Wt 126.6 kg   SpO2 96%   BMI 37.84 kg/m   Physical Exam Vitals and nursing note reviewed.  Constitutional:      General: She is not in acute distress.    Appearance: She is well-developed. She is obese. She is not diaphoretic.  HENT:     Head: Normocephalic and atraumatic.  Eyes:     Conjunctiva/sclera: Conjunctivae normal.  Cardiovascular:     Rate and Rhythm: Normal  rate and regular rhythm.     Pulses: Normal pulses.          Dorsalis pedis pulses are 2+ on the right side.       Posterior tibial pulses are 2+ on the right side.  Pulmonary:     Effort: Pulmonary effort is normal.  Musculoskeletal:     Cervical back: Neck supple.     Comments: Tenderness across the lower back.  No spinal swelling, deformity, or instability noted.  Some tenderness noted to the right knee and right distal thigh.  No discernible swelling, though this exam is limited by body habitus.  No increased warmth or color abnormality. Patient allows range of motion in the right knee and she does not display any noted difficulty with this range of motion. No tenderness, increased warmth, color change, or swelling noted to the calves.  Skin:    General: Skin is warm and dry.     Coloration: Skin is not pale.  Neurological:     Mental Status: She is alert.     Comments: Sensation grossly intact to light touch in the lower extremities bilaterally. No saddle anesthesias. Strength 5/5 in the bilateral lower extremities. Ambulatory without assistance. Coordination intact.  Psychiatric:        Behavior: Behavior normal.     ED Course/Procedures     Procedures   DG  Knee Complete 4 Views Right  Result Date: 12/25/2019 CLINICAL DATA:  Right knee pain EXAM: RIGHT KNEE - COMPLETE 4+ VIEW COMPARISON:  None. FINDINGS: No fracture or malalignment. Joint spaces are maintained. Moderate knee effusion. IMPRESSION: No acute osseous abnormality.  Moderate knee effusion Electronically Signed   By: Jasmine Pang M.D.   On: 12/25/2019 20:01   VAS [place] LOWER EXTREMITY VENOUS (DVT) (ONLY [facility] & [facility] 7a-7p)  Result Date: 12/25/2019  Lower Venous DVTStudy Indications: Swelling, and Pain.  Limitations: Body habitus. Performing Technologist: Levada Schilling RDMS, RVT  Examination Guidelines: A complete evaluation includes B-mode imaging, spectral Doppler, color Doppler, and power Doppler as needed of all  accessible portions of each vessel. Bilateral testing is considered an integral part of a complete examination. Limited examinations for reoccurring indications may be performed as noted. The reflux portion of the exam is performed with the patient in reverse Trendelenburg.  +---------+---------------+---------+-----------+----------+------------------+ RIGHT    CompressibilityPhasicitySpontaneityPropertiesThrombus Aging     +---------+---------------+---------+-----------+----------+------------------+ CFV      Full           Yes      Yes                                     +---------+---------------+---------+-----------+----------+------------------+ SFJ      Full                                                            +---------+---------------+---------+-----------+----------+------------------+ FV Prox  Full                                                            +---------+---------------+---------+-----------+----------+------------------+ FV Mid   Full                                                            +---------+---------------+---------+-----------+----------+------------------+ FV DistalFull                                                            +---------+---------------+---------+-----------+----------+------------------+ PFV      Full                                                            +---------+---------------+---------+-----------+----------+------------------+ POP      Full           Yes      Yes                                     +---------+---------------+---------+-----------+----------+------------------+  PTV                                                   poor visualization +---------+---------------+---------+-----------+----------+------------------+ PERO                                                  poor visualization  +---------+---------------+---------+-----------+----------+------------------+ GSV      Full                                                            +---------+---------------+---------+-----------+----------+------------------+   +----+---------------+---------+-----------+----------+--------------+ LEFTCompressibilityPhasicitySpontaneityPropertiesThrombus Aging +----+---------------+---------+-----------+----------+--------------+ CFV Full           Yes      Yes                                 +----+---------------+---------+-----------+----------+--------------+     Summary: RIGHT: - There is no evidence of deep vein thrombosis in the lower extremity. However, portions of this examination were limited- see technologist comments above.  - No cystic structure found in the popliteal fossa. - Hypoechoic fluid collection noted anterior right knee, area of complaint.  LEFT: - No evidence of common femoral vein obstruction.  *See table(s) above for measurements and observations.    Preliminary     MDM   Patient care handoff report received from Krista Blue, PA-C. Plan: Patient awaiting duplex ultrasound and x-ray.  Patient presents with right leg pain as well as lower back pain.  Her symptoms could be due to sciatica.  They are not overly convincing for DVT.  No DVT noted on ultrasound, though there were limitations due to the patient's body habitus. There was effusion noted on the x-ray.  My suspicion for septic joint is low based on lack of risk factors, duration of symptoms, recurrent symptoms, and exam is not suggestive.  Ideally, patient would follow-up with orthopedics on this matter.  She states she has had difficulty following up with specialist in the past due to Financial concerns.  I will give referral to our community care clinic who can then provide patient with additional resources and referrals, as needed.  The patient was given instructions for home care as well as  return precautions. Patient voices understanding of these instructions, accepts the plan, and is comfortable with discharge.  I reviewed and interpreted the patient's radiological studies.   Layla Maw 12/25/19 2154    Hayden Rasmussen, MD 12/26/19 1038

## 2020-01-09 ENCOUNTER — Inpatient Hospital Stay: Payer: Self-pay

## 2020-01-18 ENCOUNTER — Other Ambulatory Visit: Payer: Self-pay

## 2020-01-18 ENCOUNTER — Encounter (HOSPITAL_COMMUNITY): Payer: Self-pay

## 2020-01-18 ENCOUNTER — Ambulatory Visit (HOSPITAL_COMMUNITY)
Admission: EM | Admit: 2020-01-18 | Discharge: 2020-01-18 | Disposition: A | Discharge status: Home / Self Care | Payer: Self-pay | Attending: Physician Assistant | Admitting: Physician Assistant

## 2020-01-18 DIAGNOSIS — H538 Other visual disturbances: Secondary | ICD-10-CM

## 2020-01-18 DIAGNOSIS — G8929 Other chronic pain: Secondary | ICD-10-CM

## 2020-01-18 DIAGNOSIS — M25561 Pain in right knee: Secondary | ICD-10-CM

## 2020-01-18 LAB — CBG MONITORING, ED: Glucose-Capillary: 89 mg/dL (ref 70–99)

## 2020-01-18 NOTE — ED Provider Notes (Signed)
[facility]    CSN: [id number] Arrival date & time: 01/18/20  0847      History   Chief Complaint Chief Complaint  Patient presents with  . Vision Changes  . Leg Pain    HPI Diane Rice is a 34 y.o. female.   Patient with chronic right knee pain followed by Diane Rice reports for right knee pain as well as blurry vision and peripheral vision loss.  With regard to her vision complaints today.  She reports this is been ongoing for several years.  She reports she has had persistent issues with blurry vision in the sides of her vision.  She reports this has been gradually getting worse over time.  she reports seeing dots and squiggly lines in her vision.  She does report feeling that something is moving in her eye.  She reports her vision directly in front of her her is good as long as nothing is moving.  She reports when watching things move there appears to be blurring/double vision.  She denies eye pain, eye redness or headaches.  She denies feeling like her vision is going dark or a curtain coming over her vision.  She has not had head injury.  She reports she has never seen an ophthalmologist however has been to urgent care and emergency departments over the years for this.  Chart review does reveal she had MRI brain in 2019 without lesion.  She does state that symptoms were present at that time as well.  She denies any history of having issue with sugar, chart review reveals normal blood glucose in May 2020.  She was seen in this urgent care per chart review in October 2020 for sensation of something moving in her eye.  She does not wear glasses or contacts but reports wearing contacts when she was younger.  No history of diabetes.  She denies any history of eye pain or eye trauma.  She does report some recent increased thirst.  Denies frequent urination or increased hunger.  No changes in weight reported  She reports this morning there was a greater sensation of  something moving in the left eye and felt that the vision in the left eye was worse than it had been.  This is what prompted her to come in.  With regard to her knee pain she is followed by Diane Rice and received a joint injection 2 weeks ago.  She reports it felt significantly better 3 days ago however the pain is started to come back.  She reports pain is in the front of her knee is achy and sharp at times.  Is worse with walking.  She reports it hurts when going from bed to straight.  Denies any swelling or numbness or tingling down the leg.  She reports she called Diane Rice at least a few days ago and reported she was not have a lot of improvement and they told her to go to" urgent care" but did not specify whether they meant general urgent care or their after hours clinic.  She reports his pain is not significantly different than it was prior to initial joint injection.     Past Medical History:  Diagnosis Date  . Anemia   . Asthma   . Back pain, chronic   . Hypertension     Patient Active Problem List   Diagnosis Date Noted  . OBESITY 03/19/2010  . ANEMIA, MILD 03/19/2010  . BACK PAIN 03/19/2010  . VITAMIN D  DEFICIENCY 01/01/2010  . 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 DEPRESSION 12/30/2009  . CHRONIC PAIN SYNDROME 12/30/2009  . VIRAL URI 12/30/2009  . ASTHMA 12/30/2009    Past Surgical History:  Procedure Laterality Date  . MOLE REMOVAL    . WISDOM TOOTH EXTRACTION      OB History   No obstetric history on file.      Home Medications    Prior to Admission medications   Medication Sig Start Date End Date Taking? Authorizing Provider  Aspirin-Caffeine (BAYER BACK & BODY PO) Take 4 tablets by mouth every 4 (four) hours as needed (pain). 500mg  tablets    [provider]  diclofenac Sodium (VOLTAREN) 1 % GEL Apply 4 g topically 4 (four) times daily. 12/25/19   Joy, Shawn C, PA-C  ibuprofen (ADVIL) 200 MG tablet Take 400 mg by mouth every 6 (six) hours  as needed for headache or moderate pain.    [provider]  methocarbamol (ROBAXIN) 750 MG tablet Take 1 tablet (750 mg total) by mouth 2 (two) times daily as needed for muscle spasms (or muscle tightness). 12/25/19   Joy, Shawn C, PA-C  naproxen (NAPROSYN) 500 MG tablet Take 1 tablet (500 mg total) by mouth 2 (two) times daily. 06/26/19   Raylene Everts, MD  Olopatadine HCl 0.2 % SOLN Apply 1 drop to eye daily. 06/26/19   Raylene Everts, MD  predniSONE (STERAPRED UNI-PAK 21 TAB) 10 MG (21) TBPK tablet Take 6 tabs (60mg ) day 1, 5 tabs (50mg ) day 2, 4 tabs (40mg ) day 3, 3 tabs (30mg ) day 4, 2 tabs (20mg ) day 5, and 1 tab (10mg ) day 6. 12/25/19   Joy, Shawn C, PA-C  tiZANidine (ZANAFLEX) 4 MG tablet Take 1-2 tablets (4-8 mg total) by mouth every 6 (six) hours as needed for muscle spasms. 06/26/19   Raylene Everts, MD  ARIPiprazole (ABILIFY) 5 MG tablet Take 1/2 tab daily for 1 week and than full tab daily Patient not taking: Reported on 03/22/2018 11/16/17 06/26/19  Arfeen, Arlyce Harman, MD  sucralfate (CARAFATE) 1 g tablet Take 1 tablet (1 g total) by mouth 4 (four) times daily as needed. 01/06/19 06/26/19  Maudie Flakes, MD    Family History Family History  Problem Relation Age of Onset  . Depression Mother   . Bipolar disorder Sister     Social History Social History   Tobacco Use  . Smoking status: Never Smoker  . Smokeless tobacco: Never Used  Substance Use Topics  . Alcohol use: Yes    Alcohol/week: 1.0 - 2.0 standard drinks    Types: 1 - 2 Cans of beer per week    Comment: socially  . Drug use: No     Allergies   Hydrocodone-acetaminophen   Review of Systems Review of Systems   Physical Exam Triage Vital Signs ED Triage Vitals  Enc Vitals Group     BP      Pulse      Resp      Temp      Temp src      SpO2      Weight      Height      Head Circumference      Peak Flow      Pain Score      Pain Loc      Pain Edu?      Excl. in [place]?    No data  found.  Updated Vital Signs BP 138/68 (  BP Location: Left Arm)   Pulse (!) 113   Temp 98.3 F (36.8 C) (Oral)   Resp 18   LMP 12/25/2019   SpO2 100%   Visual Acuity Right Eye Distance: 20/40 Left Eye Distance: 20/40 Bilateral Distance:    Right Eye Near:   Left Eye Near:    Bilateral Near:     Physical Exam Vitals and nursing note reviewed.  Constitutional:      General: She is not in acute distress.    Appearance: She is well-developed.  HENT:     Head: Normocephalic and atraumatic.     Mouth/Throat:     Mouth: Mucous membranes are moist.  Eyes:     General: Lids are normal. Visual field deficit present. No scleral icterus.       Right eye: No foreign body, discharge or hordeolum.        Left eye: No foreign body, discharge or hordeolum.     Extraocular Movements: Extraocular movements intact.     Right eye: No nystagmus.     Left eye: No nystagmus.     Conjunctiva/sclera: Conjunctivae normal.     Right eye: Right conjunctiva is not injected. No hemorrhage.    Left eye: Left conjunctiva is not injected. No hemorrhage.    Pupils: Pupils are equal, round, and reactive to light.     Comments: Patient with central vision intact however when standing just 45 degrees off central gaze patient has blurry vision and unable to visualize number of fingers being held up.  Visual field exam, patient unable to visualize until about 60 degrees off of central line of sight.  Funduscopic exam limited due to lack of ability to dilate the eye.  Unable to visualize vessels there is no clear nicking or lesions.  Unable to locate optic disc.  No obvious retinal lesions though exam is very limited.  No photophobia during this exam.  Cardiovascular:     Rate and Rhythm: Normal rate and regular rhythm.     Heart sounds: No murmur.  Pulmonary:     Effort: Pulmonary effort is normal. No respiratory distress.     Breath sounds: Normal breath sounds.  Abdominal:     Palpations: Abdomen is  soft.     Tenderness: There is no abdominal tenderness.  Musculoskeletal:     Cervical back: Neck supple.     Comments: Knees without deformity or effusion.  No swelling, erythema or warmth.  There is tenderness to palpation over the superior aspect of the patella and mild pain elicited with compression of the patella.  Knee with full range of motion.  Appears stable.  Patient is ambulating with mild limp.  Skin:    General: Skin is warm and dry.  Neurological:     Mental Status: She is alert and oriented to person, place, and time.     Cranial Nerves: No cranial nerve deficit.     Sensory: No sensory deficit.     Motor: No weakness.     Gait: Gait normal.      UC Treatments / Results  Labs (all labs ordered are listed, but only abnormal results are displayed) Labs Reviewed  CBG MONITORING, ED    EKG   Radiology No results found.  Procedures Procedures (including critical care time)  Medications Ordered in UC Medications - No data to display  Initial Impression / Assessment and Plan / UC Course  I have reviewed the triage vital signs and the nursing notes.  Pertinent  labs & imaging results that were available during my care of the patient were reviewed by me and considered in my medical decision making (see chart for details).     #Blurry vision #Chronic pain right knee Patient is 34 year old presenting with chronic blurry vision with possible acute increase in chronic right knee pain.  CBG 89, no sign of hyperglycemia.  Patient did have an MRI in 2019 per chart review which did not show any mass lesions, given she was having symptoms at the time of this and this is confirmed with patient, doubt mass lesion at this time.  Given indolent nature doubt this is an emergency today however there is urgent need for evaluation.  Differential would include retinopathy vs macular disorder.  Spoke with ophthalmology office and they can see her today.  We will have her report to  the ophthalmology group for evaluation today.  Regard to her right knee will encourage to continue treatments recommended by Diane Rice and have her follow-up with them following my appointment today. Final Clinical Impressions(s) / UC Diagnoses   Final diagnoses:  Blurry vision, bilateral  Chronic pain of right knee     Discharge Instructions     Continue the treatments from orthopedics for you knee, call Diane Rice after your Eye appointment today  I have secured a 1215 appt at [facility] care, address and officer number attached, report there today for evaluation     ED Prescriptions    None     PDMP not reviewed this encounter.   Hermelinda Medicus, PA-C 01/18/20 1117

## 2020-01-18 NOTE — Discharge Instructions (Addendum)
Continue the treatments from orthopedics for you knee, call [name] after your Eye appointment today  I have secured a 1215 appt at [facility] care, address and officer number attached, report there today for evaluation

## 2020-01-18 NOTE — ED Triage Notes (Signed)
Pt presents with vision changes; pt states sometimes she cant see out of her peripheal line of sight, she sees spots and squiggly lines, pt states she has been having vison problems for a few weeks.  Pt also presents with right leg pain; pt states she had a joint injection almost 2 weeks ago in that leg.

## 2020-01-24 ENCOUNTER — Encounter: Payer: Self-pay | Admitting: Neurology

## 2020-04-16 NOTE — Progress Notes (Deleted)
NEUROLOGY CONSULTATION NOTE  Diane Rice MRN: [id number] DOB: May 06, 1986  Referring provider: Wynell Balloon, MD Primary care provider: No PCP  Reason for consult:  migraine  HISTORY OF PRESENT ILLNESS: Diane Rice is a 34 year old female who presents for migraine.  History supplemented by referring provider note.  ***.  She saw ophthalmology in May who did not identify any abnormalities on exam.  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    In 2019, she reported various symptoms including ***.  To evaluate for demyelinating disease, MRI of brain and cervical spine with and without contrast was performed on 03/22/2018, which was personally reviewed and was normal.   Current NSAIDS:  *** Current analgesics:  *** Current triptans:  *** Current ergotamine:  *** Current anti-emetic:  *** Current muscle relaxants:  *** Current anti-anxiolytic:  *** Current sleep aide:  *** Current Antihypertensive medications:  *** Current Antidepressant medications:  *** Current Anticonvulsant medications:  *** Current anti-CGRP:  *** Current Vitamins/Herbal/Supplements:  *** Current Antihistamines/Decongestants:  *** Other therapy:  *** Hormone/birth control:  *** Other medications:  ***  Past NSAIDS:  *** Past analgesics:  *** Past abortive triptans:  *** Past abortive ergotamine:  *** Past muscle relaxants:  *** Past anti-emetic:  *** Past antihypertensive medications:  *** Past antidepressant medications:  *** Past anticonvulsant medications:  *** Past anti-CGRP:  *** Past vitamins/Herbal/Supplements:  *** Past antihistamines/decongestants:  *** Other past therapies:  ***  Caffeine:  *** Alcohol:  *** Smoker:  *** Diet:  *** Exercise:  *** Depression:  ***; Anxiety:  *** Other pain:  *** Sleep hygiene:  *** Family history of headache:  ***   PAST MEDICAL HISTORY: Past Medical History:  Diagnosis Date  . Anemia   . Asthma   . Back pain, chronic   . Hypertension       PAST SURGICAL HISTORY: Past Surgical History:  Procedure Laterality Date  . MOLE REMOVAL    . WISDOM TOOTH EXTRACTION      MEDICATIONS: Current Outpatient Medications on File Prior to Visit  Medication Sig Dispense Refill  . Aspirin-Caffeine (BAYER BACK & BODY PO) Take 4 tablets by mouth every 4 (four) hours as needed (pain). 500mg  tablets    . diclofenac Sodium (VOLTAREN) 1 % GEL Apply 4 g topically 4 (four) times daily. 100 g 2  . ibuprofen (ADVIL) 200 MG tablet Take 400 mg by mouth every 6 (six) hours as needed for headache or moderate pain.    . methocarbamol (ROBAXIN) 750 MG tablet Take 1 tablet (750 mg total) by mouth 2 (two) times daily as needed for muscle spasms (or muscle tightness). 20 tablet 0  . naproxen (NAPROSYN) 500 MG tablet Take 1 tablet (500 mg total) by mouth 2 (two) times daily. 30 tablet 0  . Olopatadine HCl 0.2 % SOLN Apply 1 drop to eye daily. 2.5 mL 0  . predniSONE (STERAPRED UNI-PAK 21 TAB) 10 MG (21) TBPK tablet Take 6 tabs (60mg ) day 1, 5 tabs (50mg ) day 2, 4 tabs (40mg ) day 3, 3 tabs (30mg ) day 4, 2 tabs (20mg ) day 5, and 1 tab (10mg ) day 6. 21 tablet 0  . tiZANidine (ZANAFLEX) 4 MG tablet Take 1-2 tablets (4-8 mg total) by mouth every 6 (six) hours as needed for muscle spasms. 21 tablet 0  . [DISCONTINUED] ARIPiprazole (ABILIFY) 5 MG tablet Take 1/2 tab daily for 1 week and than full tab daily (Patient not taking: Reported on 03/22/2018) 30 tablet 0  . [DISCONTINUED] sucralfate (CARAFATE) 1  g tablet Take 1 tablet (1 g total) by mouth 4 (four) times daily as needed. 30 tablet 0   No current facility-administered medications on file prior to visit.    ALLERGIES: Allergies  Allergen Reactions  . Hydrocodone-Acetaminophen Itching and Other (See Comments)    hallucinations    FAMILY HISTORY: Family History  Problem Relation Age of Onset  . Depression Mother   . Bipolar disorder Sister    ***.  SOCIAL HISTORY: Social History   Socioeconomic  History  . Marital status: Significant Other    Spouse name: jamal  . Number of children: 2  . Years of education: Not on file  . Highest education level: 9th grade  Occupational History  . Not on file  Tobacco Use  . Smoking status: Never Smoker  . Smokeless tobacco: Never Used  Vaping Use  . Vaping Use: Never used  Substance and Sexual Activity  . Alcohol use: Yes    Alcohol/week: 1.0 - 2.0 standard drink    Types: 1 - 2 Cans of beer per week    Comment: socially  . Drug use: No  . Sexual activity: Yes    Birth control/protection: None  Other Topics Concern  . Not on file  Social History Narrative  . Not on file   Social Determinants of Health   Financial Resource Strain:   . Difficulty of Paying Living Expenses:   Food Insecurity:   . Worried About Programme researcher, broadcasting/film/video in the Last Year:   . Barista in the Last Year:   Transportation Needs:   . Freight forwarder (Medical):   Marland Kitchen Lack of Transportation (Non-Medical):   Physical Activity:   . Days of Exercise per Week:   . Minutes of Exercise per Session:   Stress:   . Feeling of Stress :   Social Connections:   . Frequency of Communication with Friends and Family:   . Frequency of Social Gatherings with Friends and Family:   . Attends Religious Services:   . Active Member of Clubs or Organizations:   . Attends Banker Meetings:   Marland Kitchen Marital Status:   Intimate Partner Violence:   . Fear of Current or Ex-Partner:   . Emotionally Abused:   Marland Kitchen Physically Abused:   . Sexually Abused:     REVIEW OF SYSTEMS: Constitutional: No fevers, chills, or sweats, no generalized fatigue, change in appetite Eyes: No visual changes, double vision, eye pain Ear, nose and throat: No hearing loss, ear pain, nasal congestion, sore throat Cardiovascular: No chest pain, palpitations Respiratory:  No shortness of breath at rest or with exertion, wheezes GastrointestinaI: No nausea, vomiting, diarrhea, abdominal  pain, fecal incontinence Genitourinary:  No dysuria, urinary retention or frequency Musculoskeletal:  No neck pain, back pain Integumentary: No rash, pruritus, skin lesions Neurological: as above Psychiatric: No depression, insomnia, anxiety Endocrine: No palpitations, fatigue, diaphoresis, mood swings, change in appetite, change in weight, increased thirst Hematologic/Lymphatic:  No purpura, petechiae. Allergic/Immunologic: no itchy/runny eyes, nasal congestion, recent allergic reactions, rashes  PHYSICAL EXAM: *** General: No acute distress.  Patient appears ***-groomed.  *** Head:  Normocephalic/atraumatic Eyes:  fundi examined but not visualized Neck: supple, no paraspinal tenderness, full range of motion Back: No paraspinal tenderness Heart: regular rate and rhythm Lungs: Clear to auscultation bilaterally. Vascular: No carotid bruits. Neurological Exam: Mental status: alert and oriented to person, place, and time, recent and remote memory intact, fund of knowledge intact, attention and concentration intact,  speech fluent and not dysarthric, language intact. Cranial nerves: CN I: not tested CN II: pupils equal, round and reactive to light, visual fields intact CN III, IV, VI:  full range of motion, no nystagmus, no ptosis CN V: facial sensation intact CN VII: upper and lower face symmetric CN VIII: hearing intact CN IX, X: gag intact, uvula midline CN XI: sternocleidomastoid and trapezius muscles intact CN XII: tongue midline Bulk & Tone: normal, no fasciculations. Motor:  5/5 throughout *** Sensation:  Pinprick *** temperature *** and vibration sensation intact.  ***. Deep Tendon Reflexes:  2+ throughout, *** toes downgoing.  *** Finger to nose testing:  Without dysmetria.  *** Heel to shin:  Without dysmetria.  *** Gait:  Normal station and stride.  Able to turn and tandem walk. Romberg ***.  IMPRESSION: ***  PLAN: ***  Thank you for allowing me to take part in the  care of this patient.  Shon Millet, DO  CC: ***

## 2020-04-17 ENCOUNTER — Encounter: Payer: Self-pay | Admitting: Neurology

## 2020-04-17 ENCOUNTER — Other Ambulatory Visit: Payer: Self-pay

## 2020-04-17 ENCOUNTER — Ambulatory Visit (INDEPENDENT_AMBULATORY_CARE_PROVIDER_SITE_OTHER): Payer: Self-pay | Admitting: Neurology

## 2020-04-17 VITALS — BP 117/65 | HR 83 | Ht 72.0 in | Wt 281.0 lb

## 2020-04-17 DIAGNOSIS — H5319 Other subjective visual disturbances: Secondary | ICD-10-CM

## 2020-04-17 DIAGNOSIS — H539 Unspecified visual disturbance: Secondary | ICD-10-CM

## 2020-04-17 DIAGNOSIS — G43511 Persistent migraine aura without cerebral infarction, intractable, with status migrainosus: Secondary | ICD-10-CM

## 2020-04-17 DIAGNOSIS — G43119 Migraine with aura, intractable, without status migrainosus: Secondary | ICD-10-CM

## 2020-04-17 MED ORDER — TOPIRAMATE 25 MG PO TABS: ORAL_TABLET | ORAL | 0 refills | Status: DC

## 2020-04-17 MED ORDER — RIZATRIPTAN BENZOATE 10 MG PO TABS: ORAL_TABLET | ORAL | 5 refills | Status: DC

## 2020-04-17 NOTE — Progress Notes (Signed)
NEUROLOGY CONSULTATION NOTE  Diane Rice MRN: [id number] DOB: Jan 01, 1986  Referring provider: Wynell Balloon, MD Primary care provider: No PCP  Reason for consult:  migraine  HISTORY OF PRESENT ILLNESS: Diane Rice is a 34 year old left-handed female who presents for migraine.  History supplemented by referring provider note.  Symptoms started several years ago.  It started as seeing a dark spot and blurred vision.  In 2019, she reported various symptoms including back pain, bilateral leg weakness, right shoulder weakness, blurred vision, and occasional bowel and bladder weakness with urge incontinence.  To evaluate for demyelinating disease, MRI of brain and cervical spine with and without contrast was performed on 03/22/2018, which was personally reviewed and was normal. Since then, visual symptoms have progressed.  She sees multiple black spots, lines of circles, different colors, double vision, sometimes she sees an object trailing with multiple images, blurred white spots, tunnel vision.  Symptoms are persistent.  It affects her ability to see.  She does not drive.  Around the same time, she started having significant migraines, severe right sided pounding headache associated with photophobia and phonophobia but not nausea and vomiting.  It lasts from hours to 2 days and occur every 2 1/2 to 3 weeks.  Unknown triggers.  She usually takes Tylenol Extra-strength or ibuprofen.  She saw ophthalmology in May who did not identify any abnormalities on exam.  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  For the past couple of months, she reports severe fatigue.    PAST MEDICAL HISTORY: Past Medical History:  Diagnosis Date   Anemia    Asthma    Back pain, chronic    Hypertension     PAST SURGICAL HISTORY: Past Surgical History:  Procedure Laterality Date   MOLE REMOVAL     WISDOM TOOTH EXTRACTION      MEDICATIONS: Current Outpatient Medications on File Prior to Visit    Medication Sig Dispense Refill   Aspirin-Caffeine (BAYER BACK & BODY PO) Take 4 tablets by mouth every 4 (four) hours as needed (pain). 500mg  tablets     diclofenac Sodium (VOLTAREN) 1 % GEL Apply 4 g topically 4 (four) times daily. 100 g 2   ibuprofen (ADVIL) 200 MG tablet Take 400 mg by mouth every 6 (six) hours as needed for headache or moderate pain.     methocarbamol (ROBAXIN) 750 MG tablet Take 1 tablet (750 mg total) by mouth 2 (two) times daily as needed for muscle spasms (or muscle tightness). 20 tablet 0   naproxen (NAPROSYN) 500 MG tablet Take 1 tablet (500 mg total) by mouth 2 (two) times daily. 30 tablet 0   Olopatadine HCl 0.2 % SOLN Apply 1 drop to eye daily. 2.5 mL 0   predniSONE (STERAPRED UNI-PAK 21 TAB) 10 MG (21) TBPK tablet Take 6 tabs (60mg ) day 1, 5 tabs (50mg ) day 2, 4 tabs (40mg ) day 3, 3 tabs (30mg ) day 4, 2 tabs (20mg ) day 5, and 1 tab (10mg ) day 6. 21 tablet 0   tiZANidine (ZANAFLEX) 4 MG tablet Take 1-2 tablets (4-8 mg total) by mouth every 6 (six) hours as needed for muscle spasms. 21 tablet 0   [DISCONTINUED] ARIPiprazole (ABILIFY) 5 MG tablet Take 1/2 tab daily for 1 week and than full tab daily (Patient not taking: Reported on 03/22/2018) 30 tablet 0   [DISCONTINUED] sucralfate (CARAFATE) 1 g tablet Take 1 tablet (1 g total) by mouth 4 (four) times daily as needed. 30 tablet 0   No current facility-administered medications on  file prior to visit.    ALLERGIES: Allergies  Allergen Reactions   Hydrocodone-Acetaminophen Itching and Other (See Comments)    hallucinations    FAMILY HISTORY: Family History  Problem Relation Age of Onset   Depression Mother    Bipolar disorder Sister     SOCIAL HISTORY: Social History   Socioeconomic History   Marital status: Significant Other    Spouse name: Diane Rice   Number of children: 2   Years of education: Not on file   Highest education level: 9th grade  Occupational History   Not on file   Tobacco Use   Smoking status: Never Smoker   Smokeless tobacco: Never Used  Vaping Use   Vaping Use: Never used  Substance and Sexual Activity   Alcohol use: Yes    Alcohol/week: 1.0 - 2.0 standard drink    Types: 1 - 2 Cans of beer per week    Comment: socially   Drug use: No   Sexual activity: Yes    Birth control/protection: None  Other Topics Concern   Not on file  Social History Narrative   Not on file   Social Determinants of Health   Financial Resource Strain:    Difficulty of Paying Living Expenses:   Food Insecurity:    Worried About Programme researcher, broadcasting/film/video in the Last Year:    Barista in the Last Year:   Transportation Needs:    Freight forwarder (Medical):    Lack of Transportation (Non-Medical):   Physical Activity:    Days of Exercise per Week:    Minutes of Exercise per Session:   Stress:    Feeling of Stress :   Social Connections:    Frequency of Communication with Friends and Family:    Frequency of Social Gatherings with Friends and Family:    Attends Religious Services:    Active Member of Clubs or Organizations:    Attends Banker Meetings:    Marital Status:   Intimate Partner Violence:    Fear of Current or Ex-Partner:    Emotionally Abused:    Physically Abused:    Sexually Abused:     PHYSICAL EXAM: Blood pressure 117/65, pulse 83, height 6' (1.829 m), weight 281 lb (127.5 kg), SpO2 98 %. General: No acute distress.  Patient appears well-groomed.  Head:  Normocephalic/atraumatic Eyes:  fundi examined but not visualized Neck: supple, no paraspinal tenderness, full range of motion Back: paraspinal tenderness Heart: regular rate and rhythm Lungs: Clear to auscultation bilaterally. Vascular: No carotid bruits. Neurological Exam: Mental status: alert and oriented to person, place, and time, recent and remote memory intact, fund of knowledge intact, attention and concentration intact, speech  fluent and not dysarthric, language intact. Cranial nerves: CN I: not tested CN II: pupils equal, round and reactive to light, visual fields intact CN III, IV, VI:  full range of motion, no nystagmus, no ptosis CN V: facial sensation intact CN VII: upper and lower face symmetric CN VIII: hearing intact CN IX, X: gag intact, uvula midline CN XI: sternocleidomastoid and trapezius muscles intact CN XII: tongue midline Bulk & Tone: normal, no fasciculations. Motor:  5/5 throughout  Sensation:  Pinprick and vibration sensation intact. Deep Tendon Reflexes:  2+ throughout, toes downgoing.  Finger to nose testing:  Without dysmetria.  Heel to shin:  Without dysmetria.  Gait:  Antalgic gait.  Romberg negative.  IMPRESSION: 1.  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 2.  Migraine with aura, without status migrainosus, intractable   PLAN: 1. Given worsening symptoms since prior testing, would repeat MRI of brain with and without contrast  2. For preventative management, start topiramate 25mg  at bedtime for one week, then increase to 50mg  at bedtime.  We can increase dose to 100mg  at bedtime in 5 weeks if needed. 3.  For abortive therapy, rizatriptan 10mg  4.  Limit use of pain relievers to no more than 2 days out of week to prevent risk of rebound or medication-overuse headache. 5.  Keep headache diary 6.  Follow up 6 months.   Thank you for allowing me to take part in the care of this patient.  , DO  CC:  , MD

## 2020-04-17 NOTE — Patient Instructions (Addendum)
  MRI of brain with and without contrast. We have sent a referral to [facility] for your MRI and they will call you directly to schedule your appointment. They are located at [address] [facility]. If you need to contact them directly please call [phone]. 1.  2. Start topiramate 25mg  at bedtime for a week, then increase to 50mg  at bedtime.  Contact in 5 weeks with update and we can increase dose if needed. 3. Take rizatriptan 10mg  at earliest onset of headache.  May repeat dose once in 2 hours if needed.  Maximum 2 tablets in 24 hours. 4. Limit use of pain relievers to no more than 2 days out of the week.  These medications include acetaminophen, NSAIDs (ibuprofen/Advil/Motrin, naproxen/Aleve, triptans (Imitrex/sumatriptan), Excedrin, and narcotics.  This will help reduce risk of rebound headaches. 5. Be aware of common food triggers:  - Caffeine:  coffee, black tea, cola, Mt. Dew  - Chocolate  - Dairy:  aged cheeses (brie, blue, cheddar, gouda, [place], provolone, [place], Swiss, etc), chocolate milk, buttermilk, sour cream, limit eggs and yogurt  - Nuts, peanut butter  - Alcohol  - Cereals/grains:  FRESH breads (fresh bagels, sourdough, doughnuts), yeast productions  - Processed/canned/aged/cured meats (pre-packaged deli meats, hotdogs)  - MSG/glutamate:  soy sauce, flavor enhancer, pickled/preserved/marinated foods  - Sweeteners:  aspartame (Equal, Nutrasweet).  Sugar and Splenda are okay  - Vegetables:  legumes (lima beans, lentils, snow peas, fava beans, pinto peans, peas, garbanzo beans), sauerkraut, onions, olives, pickles  - Fruit:  avocados, bananas, citrus fruit (orange, lemon, grapefruit), mango  - Other:  Frozen meals, macaroni and cheese 6. Routine exercise 7. Stay adequately hydrated (aim for 64 oz water daily) 8. Keep headache diary 9. Maintain proper stress management 10. Maintain proper sleep hygiene 11. Do not skip meals 12. Consider supplements:  magnesium  citrate 400mg  daily, riboflavin 400mg  daily, coenzyme Q10 100mg  three times daily.

## 2020-10-17 NOTE — Progress Notes (Deleted)
NEUROLOGY FOLLOW UP OFFICE NOTE  Diane Rice [id number]   Subjective:  Kate Larock is a 35 year old left-handed female who follows up for migraines.  UPDATE: MRI of brain with and without contrast was ordered but never performed.   Intensity:  *** Duration:  *** Frequency:  *** Frequency of abortive medication: *** Current NSAIDS/analgesics:  *** Current triptans:  Rizatriptan 10mg  Current Anticonvulsant medications:  topiramate 50mg  at bedtime  Caffeine:  *** Alcohol:  *** Smoker:  *** Diet:  *** Exercise:  *** Depression:  ***; Anxiety:  *** Other pain:  *** Sleep hygiene:  ***  HISTORY: Symptoms started several years ago.  It started as seeing a dark spot and blurred vision.  In 2019, she reported various symptoms including back pain, bilateral leg weakness, right shoulder weakness, blurred vision, and occasional bowel and bladder weakness with urge incontinence.  To evaluate for demyelinating disease, MRI of brain and cervical spine with and without contrast was performed on 03/22/2018, which was personally reviewed and was normal. Since then, visual symptoms have progressed.  She sees multiple black spots, lines of circles, different colors, double vision, sometimes she sees an object trailing with multiple images, blurred white spots, tunnel vision.  Symptoms are persistent.  It affects her ability to see.  She does not drive.  Around the same time, she started having significant migraines, severe right sided pounding headache associated with photophobia and phonophobia but not nausea and vomiting.  It lasts from hours to 2 days and occur every 2 1/2 to 3 weeks.  Unknown triggers.  She usually takes Tylenol Extra-strength or ibuprofen.  She saw ophthalmology in May who did not identify any abnormalities on exam.  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  For the past couple of months, she reports severe fatigue.    PAST MEDICAL HISTORY: Past Medical History:  Diagnosis Date   . Anemia   . Anemia   . Asthma   . Back pain, chronic   . Hypertension   . Muscle spasm     MEDICATIONS: Current Outpatient Medications on File Prior to Visit  Medication Sig Dispense Refill  . Acetaminophen (TYLENOL 8 HOUR PO) Take by mouth.    . Aspirin-Caffeine (BAYER BACK & BODY PO) Take 4 tablets by mouth every 4 (four) hours as needed (pain). 500mg  tablets (Patient not taking: Reported on 04/17/2020)    . diclofenac Sodium (VOLTAREN) 1 % GEL Apply 4 g topically 4 (four) times daily. (Patient not taking: Reported on 04/17/2020) 100 g 2  . ibuprofen (ADVIL) 200 MG tablet Take 400 mg by mouth every 6 (six) hours as needed for headache or moderate pain.    . methocarbamol (ROBAXIN) 750 MG tablet Take 1 tablet (750 mg total) by mouth 2 (two) times daily as needed for muscle spasms (or muscle tightness). (Patient not taking: Reported on 04/17/2020) 20 tablet 0  . naproxen (NAPROSYN) 500 MG tablet Take 1 tablet (500 mg total) by mouth 2 (two) times daily. (Patient not taking: Reported on 04/17/2020) 30 tablet 0  . Olopatadine HCl 0.2 % SOLN Apply 1 drop to eye daily. (Patient not taking: Reported on 04/17/2020) 2.5 mL 0  . predniSONE (STERAPRED UNI-PAK 21 TAB) 10 MG (21) TBPK tablet Take 6 tabs (60mg ) day 1, 5 tabs (50mg ) day 2, 4 tabs (40mg ) day 3, 3 tabs (30mg ) day 4, 2 tabs (20mg ) day 5, and 1 tab (10mg ) day 6. (Patient not taking: Reported on 04/17/2020) 21 tablet 0  . rizatriptan (MAXALT) 10  MG tablet Take 1 tablet earliest onset of migraine.  May repeat in 2 hours if needed.  Maximum 2 tablets in 24 hours. 10 tablet 5  . tiZANidine (ZANAFLEX) 4 MG tablet Take 1-2 tablets (4-8 mg total) by mouth every 6 (six) hours as needed for muscle spasms. (Patient not taking: Reported on 04/17/2020) 21 tablet 0  . topiramate (TOPAMAX) 25 MG tablet Take 1 tablet at bedtime for a week, then increase to 2 tablets at bedtime 60 tablet 0  . [DISCONTINUED] ARIPiprazole (ABILIFY) 5 MG tablet Take 1/2 tab daily for  1 week and than full tab daily (Patient not taking: Reported on 03/22/2018) 30 tablet 0  . [DISCONTINUED] sucralfate (CARAFATE) 1 g tablet Take 1 tablet (1 g total) by mouth 4 (four) times daily as needed. 30 tablet 0   No current facility-administered medications on file prior to visit.    ALLERGIES: Allergies  Allergen Reactions  . Hydrocodone-Acetaminophen Itching and Other (See Comments)    hallucinations    FAMILY HISTORY: Family History  Problem Relation Age of Onset  . Depression Mother   . Bipolar disorder Sister     SOCIAL HISTORY: Social History   Socioeconomic History  . Marital status: Significant Other    Spouse name: jamal  . Number of children: 2  . Years of education: Not on file  . Highest education level: 9th grade  Occupational History  . Not on file  Tobacco Use  . Smoking status: Never Smoker  . Smokeless tobacco: Never Used  Vaping Use  . Vaping Use: Never used  Substance and Sexual Activity  . Alcohol use: Yes    Alcohol/week: 1.0 - 2.0 standard drink    Types: 1 - 2 Cans of beer per week    Comment: socially  . Drug use: No  . Sexual activity: Yes    Birth control/protection: None  Other Topics Concern  . Not on file  Social History Narrative   Left Handed   One Story Home   Drinks Caffeine   Social Determinants of Health   Financial Resource Strain: Not on file  Food Insecurity: Not on file  Transportation Needs: Not on file  Physical Activity: Not on file  Stress: Not on file  Social Connections: Not on file  Intimate Partner Violence: Not on file     Objective:  *** General: No acute distress.  Patient appears ***-groomed.   Head:  Normocephalic/atraumatic Eyes:  Fundi examined but not visualized Neck: supple, no paraspinal tenderness, full range of motion Heart:  Regular rate and rhythm Lungs:  Clear to auscultation bilaterally Back: No paraspinal tenderness Neurological Exam: alert and oriented to person, place, and  time. Attention span and concentration intact, recent and remote memory intact, fund of knowledge intact.  Speech fluent and not dysarthric, language intact.  CN II-XII intact. Bulk and tone normal, muscle strength 5/5 throughout.  Sensation to light touch, temperature and vibration intact.  Deep tendon reflexes 2+ throughout, toes downgoing.  Finger to nose and heel to shin testing intact.  Gait normal, Romberg negative.   Assessment/Plan:   1.  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  Migraine with aura, without status migrainosus, not intractable  Shon Millet, DO

## 2020-10-20 ENCOUNTER — Ambulatory Visit: Payer: Self-pay | Admitting: Neurology

## 2021-04-09 ENCOUNTER — Other Ambulatory Visit: Payer: Self-pay

## 2021-04-09 ENCOUNTER — Encounter (HOSPITAL_COMMUNITY): Payer: Self-pay

## 2021-04-09 ENCOUNTER — Ambulatory Visit (HOSPITAL_COMMUNITY)
Admission: EM | Admit: 2021-04-09 | Discharge: 2021-04-09 | Disposition: A | Discharge status: Home / Self Care | Payer: Self-pay | Attending: Internal Medicine | Admitting: Internal Medicine

## 2021-04-09 DIAGNOSIS — Z20822 Contact with and (suspected) exposure to covid-19: Secondary | ICD-10-CM | POA: Insufficient documentation

## 2021-04-09 DIAGNOSIS — N644 Mastodynia: Secondary | ICD-10-CM

## 2021-04-09 DIAGNOSIS — J069 Acute upper respiratory infection, unspecified: Secondary | ICD-10-CM

## 2021-04-09 DIAGNOSIS — J029 Acute pharyngitis, unspecified: Secondary | ICD-10-CM | POA: Insufficient documentation

## 2021-04-09 DIAGNOSIS — Z885 Allergy status to narcotic agent status: Secondary | ICD-10-CM | POA: Insufficient documentation

## 2021-04-09 DIAGNOSIS — Z113 Encounter for screening for infections with a predominantly sexual mode of transmission: Secondary | ICD-10-CM

## 2021-04-09 DIAGNOSIS — R079 Chest pain, unspecified: Secondary | ICD-10-CM | POA: Insufficient documentation

## 2021-04-09 LAB — POC URINE PREG, ED: Preg Test, Ur: NEGATIVE

## 2021-04-09 LAB — POCT URINALYSIS DIPSTICK, ED / UC
Bilirubin Urine: NEGATIVE
Glucose, UA: NEGATIVE mg/dL
Hgb urine dipstick: NEGATIVE
Ketones, ur: NEGATIVE mg/dL
Leukocytes,Ua: NEGATIVE
Nitrite: NEGATIVE
Protein, ur: NEGATIVE mg/dL
Specific Gravity, Urine: 1.025 (ref 1.005–1.030)
Urobilinogen, UA: 0.2 mg/dL (ref 0.0–1.0)
pH: 6.5 (ref 5.0–8.0)

## 2021-04-09 LAB — HIV ANTIBODY (ROUTINE TESTING W REFLEX): HIV Screen 4th Generation wRfx: NONREACTIVE

## 2021-04-09 NOTE — ED Triage Notes (Addendum)
Pt c/o sore throat that started yesterday, medial chest pain (2 months, sharp pain), dizziness (this morning), SOB. Pt states chest pain radiates to back, states she is seeing flashes of light, and floaters.

## 2021-04-09 NOTE — ED Provider Notes (Signed)
[facility]    CSN: [id number] Arrival date & time: 04/09/21  1343      History   Chief Complaint Chief Complaint  Patient presents with   Chest Pain    HPI Solae Norling is a 35 y.o. female.   Patient here for evaluation of sore throat, chest pain, cough, and congestion that has been ongoing for the past several days.  Reports throat is painful to swallow.  Reports COVID exposure at work.  Has not taken any OTC medications or treatments.  Also requesting STD screen.  Denies any symptoms or known exposures, states that she just likes to get checked once a year.  Denies any trauma, injury, or other precipitating event.  Denies any specific alleviating or aggravating factors.  Denies any fevers, shortness of breath, N/V/D, numbness, tingling, weakness, abdominal pain, or headaches.    The history is provided by the patient.  Chest Pain Associated symptoms: cough    Past Medical History:  Diagnosis Date   Anemia    Anemia    Asthma    Back pain, chronic    Hypertension    Muscle spasm     Patient Active Problem List   Diagnosis Date Noted   OBESITY 03/19/2010   ANEMIA, MILD 03/19/2010   BACK PAIN 03/19/2010   VITAMIN D DEFICIENCY 01/01/2010   ANEMIA-IRON DEFICIENCY 12/30/2009   DEPRESSION 12/30/2009   CHRONIC PAIN SYNDROME 12/30/2009   VIRAL URI 12/30/2009   ASTHMA 12/30/2009    Past Surgical History:  Procedure Laterality Date   MOLE REMOVAL     WISDOM TOOTH EXTRACTION      OB History   No obstetric history on file.      Home Medications    Prior to Admission medications   Medication Sig Start Date End Date Taking? Authorizing Provider  Acetaminophen (TYLENOL 8 HOUR PO) Take by mouth.    [provider]  Aspirin-Caffeine (BAYER BACK & BODY PO) Take 4 tablets by mouth every 4 (four) hours as needed (pain). 500mg  tablets Patient not taking: Reported on 04/17/2020    [provider]  diclofenac Sodium (VOLTAREN) 1 % GEL  Apply 4 g topically 4 (four) times daily. Patient not taking: Reported on 04/17/2020 12/25/19   Joy, 12/27/19 C, PA-C  ibuprofen (ADVIL) 200 MG tablet Take 400 mg by mouth every 6 (six) hours as needed for headache or moderate pain.    [provider]  methocarbamol (ROBAXIN) 750 MG tablet Take 1 tablet (750 mg total) by mouth 2 (two) times daily as needed for muscle spasms (or muscle tightness). Patient not taking: Reported on 04/17/2020 12/25/19   Joy, 12/27/19 C, PA-C  naproxen (NAPROSYN) 500 MG tablet Take 1 tablet (500 mg total) by mouth 2 (two) times daily. Patient not taking: Reported on 04/17/2020 06/26/19   06/28/19, MD  Olopatadine HCl 0.2 % SOLN Apply 1 drop to eye daily. Patient not taking: Reported on 04/17/2020 06/26/19   06/28/19, MD  predniSONE (STERAPRED UNI-PAK 21 TAB) 10 MG (21) TBPK tablet Take 6 tabs (60mg ) day 1, 5 tabs (50mg ) day 2, 4 tabs (40mg ) day 3, 3 tabs (30mg ) day 4, 2 tabs (20mg ) day 5, and 1 tab (10mg ) day 6. Patient not taking: Reported on 04/17/2020 12/25/19   Joy, C, PA-C  rizatriptan (MAXALT) 10 MG tablet Take 1 tablet earliest onset of migraine.  May repeat in 2 hours if needed.  Maximum 2 tablets in 24 hours. 04/17/20  Everlena Cooper, Adam R, DO  tiZANidine (ZANAFLEX) 4 MG tablet Take 1-2 tablets (4-8 mg total) by mouth every 6 (six) hours as needed for muscle spasms. Patient not taking: Reported on 04/17/2020 06/26/19   Eustace Moore, MD  topiramate (TOPAMAX) 25 MG tablet Take 1 tablet at bedtime for a week, then increase to 2 tablets at bedtime 04/17/20   Drema Dallas, DO  ARIPiprazole (ABILIFY) 5 MG tablet Take 1/2 tab daily for 1 week and than full tab daily Patient not taking: Reported on 03/22/2018 11/16/17 06/26/19  Arfeen, Phillips Grout, MD  sucralfate (CARAFATE) 1 g tablet Take 1 tablet (1 g total) by mouth 4 (four) times daily as needed. 01/06/19 06/26/19  Sabas Sous, MD    Family History Family History  Problem Relation Age of Onset    Depression Mother    Bipolar disorder Sister     Social History Social History   Tobacco Use   Smoking status: Never   Smokeless tobacco: Never  Vaping Use   Vaping Use: Never used  Substance Use Topics   Alcohol use: Yes    Alcohol/week: 1.0 - 2.0 standard drink    Types: 1 - 2 Cans of beer per week    Comment: socially   Drug use: No     Allergies   Hydrocodone-acetaminophen   Review of Systems Review of Systems  HENT:  Positive for congestion and sore throat. Negative for sinus pressure and sinus pain.   Respiratory:  Positive for cough.   Cardiovascular:  Positive for chest pain.  All other systems reviewed and are negative.   Physical Exam Triage Vital Signs ED Triage Vitals  Enc Vitals Group     BP 04/09/21 1443 121/70     Pulse Rate 04/09/21 1443 (!) 58     Resp 04/09/21 1443 18     Temp 04/09/21 1443 98 F (36.7 C)     Temp Source 04/09/21 1443 Oral     SpO2 04/09/21 1443 98 %     Weight --      Height --      Head Circumference --      Peak Flow --      Pain Score 04/09/21 1438 8     Pain Loc --      Pain Edu? --      Excl. in GC? --    No data found.  Updated Vital Signs BP 121/70 (BP Location: Right Arm)   Pulse (!) 58   Temp 98 F (36.7 C) (Oral)   Resp 18   LMP 03/31/2021 (Approximate)   SpO2 98%   Visual Acuity Right Eye Distance:   Left Eye Distance:   Bilateral Distance:    Right Eye Near:   Left Eye Near:    Bilateral Near:     Physical Exam Vitals and nursing note reviewed.  Constitutional:      General: She is not in acute distress.    Appearance: Normal appearance. She is not ill-appearing, toxic-appearing or diaphoretic.  HENT:     Head: Normocephalic and atraumatic.     Nose: Congestion present.     Mouth/Throat:     Pharynx: Uvula midline. Pharyngeal swelling and posterior oropharyngeal erythema present. No oropharyngeal exudate.     Tonsils: No tonsillar exudate or tonsillar abscesses. 1+ on the right. 1+ on  the left.  Eyes:     Conjunctiva/sclera: Conjunctivae normal.  Cardiovascular:     Rate and Rhythm: Regular rhythm. Bradycardia  present.     Pulses: Normal pulses.     Heart sounds: Normal heart sounds.  Pulmonary:     Effort: Pulmonary effort is normal.     Breath sounds: Normal breath sounds.  Chest:  Breasts:    Right: No mass.     Left: No mass.  Abdominal:     General: Abdomen is flat.  Musculoskeletal:        General: Normal range of motion.     Cervical back: Normal range of motion.  Skin:    General: Skin is warm and dry.  Neurological:     General: No focal deficit present.     Mental Status: She is alert and oriented to person, place, and time.  Psychiatric:        Mood and Affect: Mood normal.     UC Treatments / Results  Labs (all labs ordered are listed, but only abnormal results are displayed) Labs Reviewed  SARS CORONAVIRUS 2 (TAT 6-24 HRS)  RPR  HIV ANTIBODY (ROUTINE TESTING W REFLEX)  POCT URINALYSIS DIPSTICK, ED / UC  POC URINE PREG, ED    EKG   Radiology No results found.  Procedures Procedures (including critical care time)  Medications Ordered in UC Medications - No data to display  Initial Impression / Assessment and Plan / UC Course  I have reviewed the triage vital signs and the nursing notes.  Pertinent labs & imaging results that were available during my care of the patient were reviewed by me and considered in my medical decision making (see chart for details).    Assessment negative for red flags or concerns.  EKG with sinus bradycardia, with normal rhythm.   Viral URI with cough.  COVID test pending.  Discussed conservative symptom management.  Encouraged fluids and rest.  Follow up with primary care as needed.  Breast Tenderness.   Urine pregnancy test negative.  Follow up with GYN for further evaluation.   STD screen.  Urinalysis negative with no signs of infection. Self swab obtained and will treat based on results.  RPR  and HIV pending.  Discussed safe sex practices such as condom or other barrier method use.    Final Clinical Impressions(s) / UC Diagnoses   Final diagnoses:  Viral URI with cough  Breast tenderness  Screen for STD (sexually transmitted disease)     Discharge Instructions      We will contact you if your COVID test is positive.  Please quarantine while you wait for the results.  If your test is negative you may resume normal activities.  If your test is positive please continue to quarantine for at least 5 days from your symptom onset or until you are without a fever for at least 24 hours after the medications. You can take Tylenol and/or Ibuprofen as needed for fever reduction and pain relief.  For cough: honey 1/2 to 1 teaspoon (you can dilute the honey in water or another fluid).  You can also use guaifenesin and dextromethorphan for cough. You can use a humidifier for chest congestion and cough.  If you don't have a humidifier, you can sit in the bathroom with the hot shower running.    For sore throat: try warm salt water gargles, cepacol lozenges, throat spray, warm tea or water with lemon/honey, popsicles or ice, or OTC cold relief medicine for throat discomfort.  For congestion: take a daily anti-histamine like Zyrtec, Claritin, and a oral decongestant, such as pseudoephedrine.  You can also  use Flonase 1-2 sprays in each nostril daily.  It is important to stay hydrated: drink plenty of fluids (water, gatorade/powerade/pedialyte, juices, or teas) to keep your throat moisturized and help further relieve irritation/discomfort.   Follow up with [facility] for re-evaluation of breast tenderness.  We will contact you if the results from your lab work are positive and require additional treatment.   Do not have sex while taking undergoing treatment for STI.  Make sure that all of your partners get tested and treated.  Use a condom or other barrier method for all sexual  encounters.    Return or go to the Emergency Department if symptoms worsen or do not improve in the next few days.      ED Prescriptions   None    PDMP not reviewed this encounter.   Ivette Loyal, NP 04/09/21 1546

## 2021-04-09 NOTE — Discharge Instructions (Addendum)
We will contact you if your COVID test is positive.  Please quarantine while you wait for the results.  If your test is negative you may resume normal activities.  If your test is positive please continue to quarantine for at least 5 days from your symptom onset or until you are without a fever for at least 24 hours after the medications. You can take Tylenol and/or Ibuprofen as needed for fever reduction and pain relief.  For cough: honey 1/2 to 1 teaspoon (you can dilute the honey in water or another fluid).  You can also use guaifenesin and dextromethorphan for cough. You can use a humidifier for chest congestion and cough.  If you don't have a humidifier, you can sit in the bathroom with the hot shower running.    For sore throat: try warm salt water gargles, cepacol lozenges, throat spray, warm tea or water with lemon/honey, popsicles or ice, or OTC cold relief medicine for throat discomfort.  For congestion: take a daily anti-histamine like Zyrtec, Claritin, and a oral decongestant, such as pseudoephedrine.  You can also use Flonase 1-2 sprays in each nostril daily.  It is important to stay hydrated: drink plenty of fluids (water, gatorade/powerade/pedialyte, juices, or teas) to keep your throat moisturized and help further relieve irritation/discomfort.   Follow up with [facility] for re-evaluation of breast tenderness.  We will contact you if the results from your lab work are positive and require additional treatment.   Do not have sex while taking undergoing treatment for STI.  Make sure that all of your partners get tested and treated.  Use a condom or other barrier method for all sexual encounters.    Return or go to the Emergency Department if symptoms worsen or do not improve in the next few days.

## 2021-04-10 LAB — CERVICOVAGINAL ANCILLARY ONLY
Bacterial Vaginitis (gardnerella): NEGATIVE
Candida Glabrata: NEGATIVE
Candida Vaginitis: NEGATIVE
Chlamydia: NEGATIVE
Comment: NEGATIVE
Comment: NEGATIVE
Comment: NEGATIVE
Comment: NEGATIVE
Comment: NEGATIVE
Comment: NORMAL
Neisseria Gonorrhea: NEGATIVE
Trichomonas: NEGATIVE

## 2021-04-10 LAB — SARS CORONAVIRUS 2 (TAT 6-24 HRS): SARS Coronavirus 2: NEGATIVE

## 2021-04-10 LAB — RPR: RPR Ser Ql: NONREACTIVE

## 2021-05-08 ENCOUNTER — Ambulatory Visit (HOSPITAL_COMMUNITY)
Admission: EM | Admit: 2021-05-08 | Discharge: 2021-05-08 | Disposition: A | Discharge status: Home / Self Care | Payer: Self-pay | Attending: Physician Assistant | Admitting: Physician Assistant

## 2021-05-08 ENCOUNTER — Other Ambulatory Visit: Payer: Self-pay

## 2021-05-08 ENCOUNTER — Ambulatory Visit (INDEPENDENT_AMBULATORY_CARE_PROVIDER_SITE_OTHER): Payer: Self-pay

## 2021-05-08 ENCOUNTER — Encounter (HOSPITAL_COMMUNITY): Payer: Self-pay

## 2021-05-08 DIAGNOSIS — R079 Chest pain, unspecified: Secondary | ICD-10-CM

## 2021-05-08 DIAGNOSIS — R059 Cough, unspecified: Secondary | ICD-10-CM

## 2021-05-08 MED ORDER — BENZONATATE 100 MG PO CAPS: 100.0000 mg | ORAL_CAPSULE | Freq: Three times a day (TID) | ORAL | 0 refills | Status: DC | PRN

## 2021-05-08 NOTE — Discharge Instructions (Signed)
Recommend daily allergy medication like zyrtec or claritin Recommend Flonase daily Take tessalon pearls as needed for cough If no improvement follow up with PCP or pulmonologist

## 2021-05-08 NOTE — ED Triage Notes (Signed)
Pt presents with chest pain and cough. States she has been coughing up thick clear mucus. States the sxs started in June.   Pt states she came here last month and was tested for COVID. States the results were negative.   Pt denies fever, n/v/d.

## 2021-05-08 NOTE — ED Provider Notes (Signed)
[facility]    CSN: [id number] Arrival date & time: 05/08/21  1542      History   Chief Complaint Chief Complaint  Patient presents with   Chest Pain   Cough    HPI Diane Rice is a 35 y.o. female.   Pt complains of productive persistent cough, worse at night.  Denies fever, chills, n/v/d. She is taking ibuprofen, she reports taking a cough syrup intermittently.  She denies acid reflux.  Seen about one month ago and had an EKG which was normal, COVID test at this time negative.     Past Medical History:  Diagnosis Date   Anemia    Anemia    Asthma    Back pain, chronic    Hypertension    Muscle spasm     Patient Active Problem List   Diagnosis Date Noted   OBESITY 03/19/2010   ANEMIA, MILD 03/19/2010   BACK PAIN 03/19/2010   VITAMIN D DEFICIENCY 01/01/2010   ANEMIA-IRON DEFICIENCY 12/30/2009   DEPRESSION 12/30/2009   CHRONIC PAIN SYNDROME 12/30/2009   VIRAL URI 12/30/2009   ASTHMA 12/30/2009    Past Surgical History:  Procedure Laterality Date   MOLE REMOVAL     WISDOM TOOTH EXTRACTION      OB History   No obstetric history on file.      Home Medications    Prior to Admission medications   Medication Sig Start Date End Date Taking? Authorizing Provider  Acetaminophen (TYLENOL 8 HOUR PO) Take by mouth.    [provider]  Aspirin-Caffeine (BAYER BACK & BODY PO) Take 4 tablets by mouth every 4 (four) hours as needed (pain). 500mg  tablets Patient not taking: Reported on 04/17/2020    [provider]  diclofenac Sodium (VOLTAREN) 1 % GEL Apply 4 g topically 4 (four) times daily. Patient not taking: Reported on 04/17/2020 12/25/19   Joy, 12/27/19 C, PA-C  ibuprofen (ADVIL) 200 MG tablet Take 400 mg by mouth every 6 (six) hours as needed for headache or moderate pain.    [provider]  methocarbamol (ROBAXIN) 750 MG tablet Take 1 tablet (750 mg total) by mouth 2 (two) times daily as needed for muscle spasms (or  muscle tightness). Patient not taking: Reported on 04/17/2020 12/25/19   Joy, 12/27/19 C, PA-C  naproxen (NAPROSYN) 500 MG tablet Take 1 tablet (500 mg total) by mouth 2 (two) times daily. Patient not taking: Reported on 04/17/2020 06/26/19   06/28/19, MD  Olopatadine HCl 0.2 % SOLN Apply 1 drop to eye daily. Patient not taking: Reported on 04/17/2020 06/26/19   06/28/19, MD  predniSONE (STERAPRED UNI-PAK 21 TAB) 10 MG (21) TBPK tablet Take 6 tabs (60mg ) day 1, 5 tabs (50mg ) day 2, 4 tabs (40mg ) day 3, 3 tabs (30mg ) day 4, 2 tabs (20mg ) day 5, and 1 tab (10mg ) day 6. Patient not taking: Reported on 04/17/2020 12/25/19   Joy, C, PA-C  rizatriptan (MAXALT) 10 MG tablet Take 1 tablet earliest onset of migraine.  May repeat in 2 hours if needed.  Maximum 2 tablets in 24 hours. 04/17/20   , DO  tiZANidine (ZANAFLEX) 4 MG tablet Take 1-2 tablets (4-8 mg total) by mouth every 6 (six) hours as needed for muscle spasms. Patient not taking: Reported on 04/17/2020 06/26/19   04/19/2020, MD  topiramate (TOPAMAX) 25 MG tablet Take 1 tablet at bedtime for a week, then increase to 2 tablets at  bedtime 04/17/20   Shon Millet R, DO  ARIPiprazole (ABILIFY) 5 MG tablet Take 1/2 tab daily for 1 week and than full tab daily Patient not taking: Reported on 03/22/2018 11/16/17 06/26/19  Arfeen, Phillips Grout, MD  sucralfate (CARAFATE) 1 g tablet Take 1 tablet (1 g total) by mouth 4 (four) times daily as needed. 01/06/19 06/26/19  Sabas Sous, MD    Family History Family History  Problem Relation Age of Onset   Depression Mother    Bipolar disorder Sister     Social History Social History   Tobacco Use   Smoking status: Never   Smokeless tobacco: Never  Vaping Use   Vaping Use: Never used  Substance Use Topics   Alcohol use: Yes    Alcohol/week: 1.0 - 2.0 standard drink    Types: 1 - 2 Cans of beer per week    Comment: socially   Drug use: No     Allergies    Hydrocodone-acetaminophen   Review of Systems Review of Systems  Constitutional:  Negative for chills and fever.  HENT:  Positive for postnasal drip. Negative for ear pain and sore throat.   Eyes:  Negative for pain and visual disturbance.  Respiratory:  Positive for cough. Negative for shortness of breath, wheezing and stridor.   Cardiovascular:  Negative for chest pain and palpitations.  Gastrointestinal:  Negative for abdominal pain and vomiting.  Genitourinary:  Negative for dysuria and hematuria.  Musculoskeletal:  Negative for arthralgias and back pain.  Skin:  Negative for color change and rash.  Neurological:  Negative for seizures and syncope.  All other systems reviewed and are negative.   Physical Exam Triage Vital Signs ED Triage Vitals  Enc Vitals Group     BP 05/08/21 1720 (!) 142/73     Pulse Rate 05/08/21 1716 62     Resp 05/08/21 1716 17     Temp 05/08/21 1716 98 F (36.7 C)     Temp Source 05/08/21 1716 Oral     SpO2 05/08/21 1716 100 %     Weight --      Height --      Head Circumference --      Peak Flow --      Pain Score 05/08/21 1713 5     Pain Loc --      Pain Edu? --      Excl. in GC? --    No data found.  Updated Vital Signs BP (!) 142/73 (BP Location: Left Arm)   Pulse 62   Temp 98 F (36.7 C) (Oral)   Resp 17   LMP 04/24/2021 (Exact Date)   SpO2 100%   Visual Acuity Right Eye Distance:   Left Eye Distance:   Bilateral Distance:    Right Eye Near:   Left Eye Near:    Bilateral Near:     Physical Exam Vitals and nursing note reviewed.  Constitutional:      General: She is not in acute distress.    Appearance: She is well-developed.  HENT:     Head: Normocephalic and atraumatic.  Eyes:     Conjunctiva/sclera: Conjunctivae normal.  Cardiovascular:     Rate and Rhythm: Normal rate and regular rhythm.     Heart sounds: No murmur heard. Pulmonary:     Effort: Pulmonary effort is normal. No respiratory distress.     Breath  sounds: Normal breath sounds.  Abdominal:     Palpations: Abdomen is soft.  Tenderness: There is no abdominal tenderness.  Musculoskeletal:     Cervical back: Neck supple.  Skin:    General: Skin is warm and dry.  Neurological:     Mental Status: She is alert.     UC Treatments / Results  Labs (all labs ordered are listed, but only abnormal results are displayed) Labs Reviewed - No data to display  EKG   Radiology No results found.  Procedures Procedures (including critical care time)  Medications Ordered in UC Medications - No data to display  Initial Impression / Assessment and Plan / UC Course  I have reviewed the triage vital signs and the nursing notes.  Pertinent labs & imaging results that were available during my care of the patient were reviewed by me and considered in my medical decision making (see chart for details).     Cough, persistent over the last three months.  Advised to resume Zyrtec and Flonase. Tessalong pearls prescribed.  Pt denies fever, chills; vitals wnl, chest xray normal.  If no improvement recommended PCP follow up or pulmonology f/u.  Final Clinical Impressions(s) / UC Diagnoses   Final diagnoses:  None   Discharge Instructions   None    ED Prescriptions   None    PDMP not reviewed this encounter.   Ward, Tylene Fantasia, PA-C 05/08/21 1850

## 2021-08-25 ENCOUNTER — Ambulatory Visit (INDEPENDENT_AMBULATORY_CARE_PROVIDER_SITE_OTHER): Payer: Self-pay

## 2021-08-25 ENCOUNTER — Ambulatory Visit (HOSPITAL_COMMUNITY)
Admission: EM | Admit: 2021-08-25 | Discharge: 2021-08-25 | Disposition: A | Discharge status: Home / Self Care | Payer: Self-pay | Attending: Family Medicine | Admitting: Family Medicine

## 2021-08-25 ENCOUNTER — Other Ambulatory Visit: Payer: Self-pay

## 2021-08-25 ENCOUNTER — Encounter (HOSPITAL_COMMUNITY): Payer: Self-pay | Admitting: Emergency Medicine

## 2021-08-25 DIAGNOSIS — R059 Cough, unspecified: Secondary | ICD-10-CM

## 2021-08-25 DIAGNOSIS — J4540 Moderate persistent asthma, uncomplicated: Secondary | ICD-10-CM

## 2021-08-25 DIAGNOSIS — M79671 Pain in right foot: Secondary | ICD-10-CM

## 2021-08-25 DIAGNOSIS — R052 Subacute cough: Secondary | ICD-10-CM

## 2021-08-25 MED ORDER — PREDNISONE 20 MG PO TABS: 40.0000 mg | ORAL_TABLET | Freq: Every day | ORAL | 0 refills | Status: DC

## 2021-08-25 MED ORDER — ALBUTEROL SULFATE HFA 108 (90 BASE) MCG/ACT IN AERS: 1.0000 | INHALATION_SPRAY | RESPIRATORY_TRACT | 0 refills | Status: DC | PRN

## 2021-08-25 NOTE — ED Provider Notes (Signed)
[facility]    CSN: [id number] Arrival date & time: 08/25/21  1443      History   Chief Complaint Chief Complaint  Patient presents with   Cough   Ankle Pain    Right    HPI Diane Rice is a 35 y.o. female.    Cough Ankle Pain Here for cough, going on for several months. Has at times felt congested in her chest, not now. Does have some postnasal dc/congestion in her throat. Feels she brings up lots of clear fluid(not so much mucus.). No recent fever.  Allergy meds have not helped.  Also has right ankle pain--actually shows me it is hurting on her right heel, especially medially. No trauma or fall  Past Medical History:  Diagnosis Date   Anemia    Anemia    Asthma    Back pain, chronic    Hypertension    Muscle spasm     Patient Active Problem List   Diagnosis Date Noted   OBESITY 03/19/2010   ANEMIA, MILD 03/19/2010   BACK PAIN 03/19/2010   VITAMIN D DEFICIENCY 01/01/2010   ANEMIA-IRON DEFICIENCY 12/30/2009   DEPRESSION 12/30/2009   CHRONIC PAIN SYNDROME 12/30/2009   VIRAL URI 12/30/2009   ASTHMA 12/30/2009    Past Surgical History:  Procedure Laterality Date   MOLE REMOVAL     WISDOM TOOTH EXTRACTION      OB History   No obstetric history on file.      Home Medications    Prior to Admission medications   Medication Sig Start Date End Date Taking? Authorizing Provider  albuterol (VENTOLIN HFA) 108 (90 Base) MCG/ACT inhaler Inhale 1-2 puffs into the lungs every 4 (four) hours as needed for wheezing or shortness of breath. 08/25/21  Yes Barrett Henle, MD  predniSONE (DELTASONE) 20 MG tablet Take 2 tablets (40 mg total) by mouth daily with breakfast for 5 days. 08/25/21 08/30/21 Yes Barrett Henle, MD  Acetaminophen (TYLENOL 8 HOUR PO) Take by mouth.    [provider]  benzonatate (TESSALON) 100 MG capsule Take 1 capsule (100 mg total) by mouth 3 (three) times daily as needed for cough. 05/08/21   Ward, Lenise Arena,  PA-C  rizatriptan (MAXALT) 10 MG tablet Take 1 tablet earliest onset of migraine.  May repeat in 2 hours if needed.  Maximum 2 tablets in 24 hours. 04/17/20   Pieter Partridge, DO  topiramate (TOPAMAX) 25 MG tablet Take 1 tablet at bedtime for a week, then increase to 2 tablets at bedtime 04/17/20   Metta Clines R, DO  ARIPiprazole (ABILIFY) 5 MG tablet Take 1/2 tab daily for 1 week and than full tab daily Patient not taking: Reported on 03/22/2018 11/16/17 06/26/19  Arfeen, Arlyce Harman, MD  sucralfate (CARAFATE) 1 g tablet Take 1 tablet (1 g total) by mouth 4 (four) times daily as needed. 01/06/19 06/26/19  Maudie Flakes, MD    Family History Family History  Problem Relation Age of Onset   Depression Mother    Bipolar disorder Sister     Social History Social History   Tobacco Use   Smoking status: Never   Smokeless tobacco: Never  Vaping Use   Vaping Use: Never used  Substance Use Topics   Alcohol use: Yes    Alcohol/week: 1.0 - 2.0 standard drink    Types: 1 - 2 Cans of beer per week    Comment: socially   Drug use: No  Allergies   Hydrocodone-acetaminophen   Review of Systems Review of Systems  Respiratory:  Positive for cough.     Physical Exam Triage Vital Signs ED Triage Vitals  Enc Vitals Group     BP 08/25/21 1602 126/83     Pulse Rate 08/25/21 1602 79     Resp 08/25/21 1602 17     Temp 08/25/21 1602 98.3 F (36.8 C)     Temp Source 08/25/21 1602 Oral     SpO2 08/25/21 1602 98 %     Weight --      Height --      Head Circumference --      Peak Flow --      Pain Score 08/25/21 1601 8     Pain Loc --      Pain Edu? --      Excl. in GC? --    No data found.  Updated Vital Signs BP 126/83 (BP Location: Left Arm)    Pulse 79    Temp 98.3 F (36.8 C) (Oral)    Resp 17    LMP 08/04/2021    SpO2 98%   Visual Acuity Right Eye Distance:   Left Eye Distance:   Bilateral Distance:    Right Eye Near:   Left Eye Near:    Bilateral Near:     Physical  Exam Vitals reviewed.  Constitutional:      General: She is not in acute distress.    Appearance: She is not toxic-appearing.  HENT:     Right Ear: Tympanic membrane normal.     Left Ear: Tympanic membrane normal.     Nose: Nose normal.     Mouth/Throat:     Mouth: Mucous membranes are moist.     Pharynx: No oropharyngeal exudate or posterior oropharyngeal erythema.  Eyes:     Extraocular Movements: Extraocular movements intact.     Pupils: Pupils are equal, round, and reactive to light.  Cardiovascular:     Rate and Rhythm: Normal rate and regular rhythm.     Heart sounds: No murmur heard. Pulmonary:     Effort: Pulmonary effort is normal.     Breath sounds: No stridor. No wheezing or rhonchi.     Comments: BS distant Musculoskeletal:     Cervical back: Neck supple. No tenderness.  Lymphadenopathy:     Cervical: No cervical adenopathy.  Skin:    Coloration: Skin is not jaundiced or pale.  Neurological:     General: No focal deficit present.     Mental Status: She is alert and oriented to person, place, and time.     UC Treatments / Results  Labs (all labs ordered are listed, but only abnormal results are displayed) Labs Reviewed - No data to display  EKG   Radiology DG Chest 2 View  Result Date: 08/25/2021 CLINICAL DATA:  Cough for 3 days.  Former smoker. EXAM: CHEST - 2 VIEW COMPARISON:  05/08/2021 FINDINGS: The heart size and mediastinal contours are within normal limits. Both lungs are clear. The visualized skeletal structures are unremarkable. IMPRESSION: No active cardiopulmonary disease. Electronically Signed   By: Burman Nieves M.D.   On: 08/25/2021 16:48    Procedures Procedures (including critical care time)  Medications Ordered in UC Medications - No data to display  Initial Impression / Assessment and Plan / UC Course  I have reviewed the triage vital signs and the nursing notes.  Pertinent labs & imaging results that were available during my  care of the patient were reviewed by me and considered in my medical decision making (see chart for details).     CXR reading given verbally--unremarkable. Final Clinical Impressions(s) / UC Diagnoses   Final diagnoses:  Subacute cough  Moderate persistent asthma without complication  Pain of right heel     Discharge Instructions      Use the inhaler every 4 hours as needed for shortness of breath/cough  Take the prednisone 2 daily for 5 days. Then take ibuprofen 400 mg over the counter 3 times daily as needed for the heel pain.       ED Prescriptions     Medication Sig Dispense Auth. Provider   albuterol (VENTOLIN HFA) 108 (90 Base) MCG/ACT inhaler Inhale 1-2 puffs into the lungs every 4 (four) hours as needed for wheezing or shortness of breath. 1 each Barrett Henle, MD   predniSONE (DELTASONE) 20 MG tablet Take 2 tablets (40 mg total) by mouth daily with breakfast for 5 days. 10 tablet Windy Carina Gwenlyn Perking, MD      PDMP not reviewed this encounter.   Barrett Henle, MD 08/25/21 [phone]

## 2021-08-25 NOTE — ED Triage Notes (Signed)
Pt presents with cough. States has been going on for a couple months. States "coughing up liquids day and night."   Pt also c/o right ankle pain and swelling xs 3 days.

## 2021-08-25 NOTE — Discharge Instructions (Addendum)
Use the inhaler every 4 hours as needed for shortness of breath/cough  Take the prednisone 2 daily for 5 days. Then take ibuprofen 400 mg over the counter 3 times daily as needed for the heel pain.

## 2021-08-26 ENCOUNTER — Telehealth (HOSPITAL_COMMUNITY): Payer: Self-pay

## 2021-08-26 MED ORDER — ALBUTEROL SULFATE HFA 108 (90 BASE) MCG/ACT IN AERS: 1.0000 | INHALATION_SPRAY | RESPIRATORY_TRACT | 0 refills | Status: DC | PRN

## 2021-08-26 MED ORDER — PREDNISONE 20 MG PO TABS: 40.0000 mg | ORAL_TABLET | Freq: Every day | ORAL | 0 refills | Status: AC

## 2021-09-16 ENCOUNTER — Encounter (HOSPITAL_COMMUNITY): Payer: Self-pay | Admitting: Emergency Medicine

## 2021-09-16 ENCOUNTER — Emergency Department (HOSPITAL_COMMUNITY): Payer: Self-pay

## 2021-09-16 ENCOUNTER — Other Ambulatory Visit: Payer: Self-pay

## 2021-09-16 ENCOUNTER — Emergency Department (HOSPITAL_COMMUNITY)
Admission: EM | Admit: 2021-09-16 | Discharge: 2021-09-16 | Disposition: A | Discharge status: Home / Self Care | Payer: Self-pay | Attending: Emergency Medicine | Admitting: Emergency Medicine

## 2021-09-16 DIAGNOSIS — D509 Iron deficiency anemia, unspecified: Secondary | ICD-10-CM

## 2021-09-16 DIAGNOSIS — N938 Other specified abnormal uterine and vaginal bleeding: Secondary | ICD-10-CM

## 2021-09-16 DIAGNOSIS — Z8616 Personal history of COVID-19: Secondary | ICD-10-CM | POA: Insufficient documentation

## 2021-09-16 LAB — CBC WITH DIFFERENTIAL/PLATELET
Abs Immature Granulocytes: 0.02 10*3/uL (ref 0.00–0.07)
Basophils Absolute: 0 10*3/uL (ref 0.0–0.1)
Basophils Relative: 1 %
Eosinophils Absolute: 0.1 10*3/uL (ref 0.0–0.5)
Eosinophils Relative: 2 %
HCT: 25.2 % — ABNORMAL LOW (ref 36.0–46.0)
Hemoglobin: 7.3 g/dL — ABNORMAL LOW (ref 12.0–15.0)
Immature Granulocytes: 0 %
Lymphocytes Relative: 31 %
Lymphs Abs: 2.3 10*3/uL (ref 0.7–4.0)
MCH: 21.5 pg — ABNORMAL LOW (ref 26.0–34.0)
MCHC: 29 g/dL — ABNORMAL LOW (ref 30.0–36.0)
MCV: 74.1 fL — ABNORMAL LOW (ref 80.0–100.0)
Monocytes Absolute: 0.5 10*3/uL (ref 0.1–1.0)
Monocytes Relative: 6 %
Neutro Abs: 4.5 10*3/uL (ref 1.7–7.7)
Neutrophils Relative %: 60 %
Platelets: 421 10*3/uL — ABNORMAL HIGH (ref 150–400)
RBC: 3.4 MIL/uL — ABNORMAL LOW (ref 3.87–5.11)
RDW: 19.6 % — ABNORMAL HIGH (ref 11.5–15.5)
WBC: 7.5 10*3/uL (ref 4.0–10.5)
nRBC: 0 % (ref 0.0–0.2)

## 2021-09-16 LAB — URINALYSIS, ROUTINE W REFLEX MICROSCOPIC
Bilirubin Urine: NEGATIVE
Glucose, UA: NEGATIVE mg/dL
Ketones, ur: NEGATIVE mg/dL
Leukocytes,Ua: NEGATIVE
Nitrite: NEGATIVE
Protein, ur: NEGATIVE mg/dL
Specific Gravity, Urine: >1.03 — ABNORMAL HIGH (ref 1.005–1.030)
pH: 5.5 (ref 5.0–8.0)

## 2021-09-16 LAB — I-STAT BETA HCG BLOOD, ED (MC, WL, AP ONLY): I-stat hCG, quantitative: <5 m[IU]/mL (ref <5)

## 2021-09-16 LAB — COMPREHENSIVE METABOLIC PANEL
ALT: 15 U/L (ref 0–44)
AST: 22 U/L (ref 15–41)
Albumin: 3.7 g/dL (ref 3.5–5.0)
Alkaline Phosphatase: 48 U/L (ref 38–126)
Anion gap: 7 (ref 5–15)
BUN: 11 mg/dL (ref 6–20)
CO2: 20 mmol/L — ABNORMAL LOW (ref 22–32)
Calcium: 8.6 mg/dL — ABNORMAL LOW (ref 8.9–10.3)
Chloride: 109 mmol/L (ref 98–111)
Creatinine, Ser: 0.73 mg/dL (ref 0.44–1.00)
GFR, Estimated: >60 mL/min (ref >60)
Glucose, Bld: 110 mg/dL — ABNORMAL HIGH (ref 70–99)
Potassium: 3.5 mmol/L (ref 3.5–5.1)
Sodium: 136 mmol/L (ref 135–145)
Total Bilirubin: 0.4 mg/dL (ref 0.3–1.2)
Total Protein: 7 g/dL (ref 6.5–8.1)

## 2021-09-16 LAB — WET PREP, GENITAL
Clue Cells Wet Prep HPF POC: NONE SEEN
Sperm: NONE SEEN
Trich, Wet Prep: NONE SEEN
WBC, Wet Prep HPF POC: >=10 — AB (ref <10)
Yeast Wet Prep HPF POC: NONE SEEN

## 2021-09-16 LAB — TROPONIN I (HIGH SENSITIVITY): Troponin I (High Sensitivity): 5 ng/L (ref <18)

## 2021-09-16 LAB — URINALYSIS, MICROSCOPIC (REFLEX): Bacteria, UA: NONE SEEN

## 2021-09-16 LAB — LIPASE, BLOOD: Lipase: 36 U/L (ref 11–51)

## 2021-09-16 MED ORDER — FERROUS SULFATE 325 (65 FE) MG PO TABS: 325.0000 mg | ORAL_TABLET | Freq: Every day | ORAL | 0 refills | Status: DC

## 2021-09-16 MED ORDER — ACETAMINOPHEN 500 MG PO TABS
1000.0000 mg | ORAL_TABLET | Freq: Once | ORAL | Status: AC
  Administered 2021-09-16: 1000 mg via ORAL
  Filled 2021-09-16: qty 2

## 2021-09-16 MED ORDER — ACETAMINOPHEN 500 MG PO TABS: 500.0000 mg | ORAL_TABLET | Freq: Four times a day (QID) | ORAL | 0 refills | Status: DC | PRN

## 2021-09-16 NOTE — ED Notes (Signed)
Discharge instructions including follow up care and prescription discussed with pt. Pt verbalized understanding with no questions at this time. Pt discharged home with significant other at bedside.

## 2021-09-16 NOTE — ED Provider Notes (Signed)
[facility] EMERGENCY DEPARTMENT Provider Note   CSN: [id number] Arrival date & time: 09/16/21  1816     History  Chief Complaint  Patient presents with   Vaginal Bleeding    Diane Rice is a 36 y.o. female.  The history is provided by the patient. No language interpreter was used.  Vaginal Bleeding   Pt presents to the ED today with complaints of vaginal bleeding. The bleeding started this morning and is associated with suprapubic pain/cramping. The pain is worse when she is up moving around. She also endorses breast tenderness. LMP ended 2 weeks ago, she states she does not usually have bleeding between cycles. Her cycles typically last 4 days and are heavy. She denies N/V/D, CP, fever/chills. States she is SOB but this is related to having covid recently and a persistent cough for which she is using albuterol.   Home Medications Prior to Admission medications   Medication Sig Start Date End Date Taking? Authorizing Provider  Acetaminophen (TYLENOL 8 HOUR PO) Take by mouth.    [provider]  albuterol (VENTOLIN HFA) 108 (90 Base) MCG/ACT inhaler Inhale 1-2 puffs into the lungs every 4 (four) hours as needed for wheezing or shortness of breath. 08/26/21   Zenia Resides, MD  benzonatate (TESSALON) 100 MG capsule Take 1 capsule (100 mg total) by mouth 3 (three) times daily as needed for cough. 05/08/21   Ward, Tylene Fantasia, PA-C  rizatriptan (MAXALT) 10 MG tablet Take 1 tablet earliest onset of migraine.  May repeat in 2 hours if needed.  Maximum 2 tablets in 24 hours. 04/17/20   Drema Dallas, DO  topiramate (TOPAMAX) 25 MG tablet Take 1 tablet at bedtime for a week, then increase to 2 tablets at bedtime 04/17/20   Shon Millet R, DO  ARIPiprazole (ABILIFY) 5 MG tablet Take 1/2 tab daily for 1 week and than full tab daily Patient not taking: Reported on 03/22/2018 11/16/17 06/26/19  Arfeen, Phillips Grout, MD  sucralfate (CARAFATE) 1 g tablet Take 1 tablet (1 g  total) by mouth 4 (four) times daily as needed. 01/06/19 06/26/19  Sabas Sous, MD      Allergies    Hydrocodone-acetaminophen    Review of Systems   Review of Systems  Genitourinary:  Positive for vaginal bleeding.  All other systems reviewed and are negative.  Physical Exam Updated Vital Signs BP 139/69    Pulse 94    Temp 98.3 F (36.8 C) (Oral)    Resp 16    SpO2 98%  Physical Exam Vitals and nursing note reviewed.  Constitutional:      General: She is not in acute distress.    Appearance: She is well-developed. She is obese.  HENT:     Head: Atraumatic.  Eyes:     Conjunctiva/sclera: Conjunctivae normal.  Pulmonary:     Effort: Pulmonary effort is normal.  Abdominal:     Palpations: Abdomen is soft.     Tenderness: There is no abdominal tenderness.  Genitourinary:    Comments: Please refer to procedural note Musculoskeletal:     Cervical back: Neck supple.  Skin:    Findings: No rash.  Neurological:     Mental Status: She is alert.  Psychiatric:        Mood and Affect: Mood normal.    ED Results / Procedures / Treatments   Labs (all labs ordered are listed, but only abnormal results are displayed) Labs Reviewed  WET PREP, GENITAL -  Abnormal; Notable for the following components:      Result Value   WBC, Wet Prep HPF POC >=10 (*)    All other components within normal limits  CBC WITH DIFFERENTIAL/PLATELET - Abnormal; Notable for the following components:   RBC 3.40 (*)    Hemoglobin 7.3 (*)    HCT 25.2 (*)    MCV 74.1 (*)    MCH 21.5 (*)    MCHC 29.0 (*)    RDW 19.6 (*)    Platelets 421 (*)    All other components within normal limits  COMPREHENSIVE METABOLIC PANEL - Abnormal; Notable for the following components:   CO2 20 (*)    Glucose, Bld 110 (*)    Calcium 8.6 (*)    All other components within normal limits  URINALYSIS, ROUTINE W REFLEX MICROSCOPIC - Abnormal; Notable for the following components:   Specific Gravity, Urine >1.030 (*)     Hgb urine dipstick MODERATE (*)    All other components within normal limits  LIPASE, BLOOD  URINALYSIS, MICROSCOPIC (REFLEX)  I-STAT BETA HCG BLOOD, ED ([facility], [facility], [facility] ONLY)  GC/CHLAMYDIA PROBE AMP ([place]) NOT AT [facility]  TROPONIN I (HIGH SENSITIVITY)  TROPONIN I (HIGH SENSITIVITY)    EKG EKG Interpretation  Date/Time:  Wednesday September 16 2021 18:25:43 EST Ventricular Rate:  73 PR Interval:  158 QRS Duration: 78 QT Interval:  384 QTC Calculation: 423 R Axis:   49 Text Interpretation: Normal sinus rhythm Normal ECG When compared with ECG of 09-Apr-2021 14:55, PREVIOUS ECG IS PRESENT Confirmed by Alvester Chourifan, Matthew [phone]([id number]) on 09/16/2021 7:45:21 PM  Radiology DG Chest 2 View  Result Date: 09/16/2021 CLINICAL DATA:  Chest pain and lumps to her right breast. EXAM: CHEST - 2 VIEW COMPARISON:  August 25, 2021 FINDINGS: The heart size and mediastinal contours are within normal limits. Both lungs are clear. The visualized skeletal structures are unremarkable. IMPRESSION: No active cardiopulmonary disease. Electronically Signed   By: Aram Candelahaddeus  Houston M.D.   On: 09/16/2021 19:35    Procedures Pelvic exam  Date/Time: 09/16/2021 9:28 PM Performed by: Fayrene Helperran, Ledarius Leeson, PA-C Authorized by: Fayrene Helperran, Norene Oliveri, PA-C  Comments: Chaperone present during exam.  No inguinal lymphadenopathy or hernia noted.  Normal external genitalia.  Mild discomfort with speculum insertion.  Close cervical os with small amount of blood extruding from the os.  No vaginal tear no significant amount of vaginal bleeding and no vaginal discharge noted.  No significant adnexal tenderness or cervical motion tenderness.      Medications Ordered in ED Medications  acetaminophen (TYLENOL) tablet 1,000 mg (1,000 mg Oral Given 09/16/21 2151)    ED Course/ Medical Decision Making/ A&P                           Medical Decision Making Problems Addressed: Dysfunctional uterine bleeding: acute illness or injury    Details: close  follow up with OBGYN Iron deficiency anemia, unspecified iron deficiency anemia type: chronic illness or injury    Details: Hgb 7.3.  Iron deficiency anemia.  will prescribe iron suplementation and outpt f/u.  return precaution given.  Amount and/or Complexity of Data Reviewed Independent Historian: spouse External Data Reviewed: labs.    Details: previous Hgb around 8ish Labs: ordered. Radiology: ordered.  Risk OTC drugs.   BP 133/87    Pulse 71    Temp 98.3 F (36.8 C) (Oral)    Resp 16    SpO2 100%  8:29 PM This is a 36 year old female with significant history of iron deficiency anemia, chronic pain syndrome, who presents for evaluation of vaginal bleeding.  Patient states she is regular when it comes to her menstruation.  She however developed vaginal bleeding that started today when her last menstrual period was approximately 2 weeks ago.  This is unusual for her.  She also endorsed some lower abdominal cramping with this bleeding.  She endorsed having some pain in her chest and some shortness of breath as well but previously had COVID infection.  She complains of breast pain but states that this felt similar to menstruation.  She denies any recent injury and also denies any pain with sexual activities.  No new sexual partner.  On exam this is a well-appearing obese female appears to be in no acute discomfort.  Heart and lung sounds normal, abdomen is soft and nontender, will perform pelvic exam for further assessment.  Labs was reviewed and independently interpreted by me.  Patient's pregnancy test is negative, CBC remarkable for hemoglobin of 7.3 and I have reviewed patient's prior lab values and her hemoglobin is usually around 8.  This is a slight drop from previous.  Her electrolyte panels are reassuring.  EKG, troponin, and chest x-ray obtained to assess for her chest pain and unremarkable.  X-ray was independently reviewed and interpreted by me.  EKG was independently reviewed and  interpreted by me and showed normal sinus rhythm unchanged from prior.  9:29 PM Pelvic exam does demonstrate a small amount of bleeding coming from the cervix but no significant vaginal discharge or other concerning findings such as PID.  No vaginal laceration or vaginal tear.  10:02 PM Wet prep obtained show no concerning signs of infection.  As mentioned earlier patient did endorse some chest pain and shortness of breath initially.  This possibility that this could be related to anemia.  I have consider blood transfusion for symptomatic anemia however, patient with stable vital sign, she is not tachycardic, she is not hypotensive and bleeding is scant.  Would like to provide iron supplementation and outpatient follow-up for further managements of her anemia.  I have low suspicion for ACS as patient is low risk based on hear score.  Patient is PERC negative, doubt PE  Her dysfunctional vaginal bleeding can be evaluated by her OB/GYN.  I encouraged patient to return probably if her bleeding persist, if she has worsening symptoms she may benefit from blood transfusion.  Patient voiced understanding and agrees with plan.        Final Clinical Impression(s) / ED Diagnoses Final diagnoses:  Dysfunctional uterine bleeding  Iron deficiency anemia, unspecified iron deficiency anemia type    Rx / DC Orders ED Discharge Orders          Ordered    acetaminophen (TYLENOL) 500 MG tablet  Every 6 hours PRN        09/16/21 2212    ferrous sulfate 325 (65 FE) MG tablet  Daily        09/16/21 2212              Fayrene Helper, PA-C 09/16/21 2216    Terald Sleeper, MD 09/16/21 2224

## 2021-09-16 NOTE — ED Triage Notes (Addendum)
Pt c/o vaginal bleeding, LMP last week. Pt also c/o abdominal cramping and vision changes prior to the new bleeding. Vision is at baseline at this time. Pt c/o chest pain and lumps to her right breast.

## 2021-09-16 NOTE — ED Provider Triage Note (Signed)
Emergency Medicine Provider Triage Evaluation Note  Diane Rice , a 36 y.o. female  was evaluated in triage.  Pt complains of Vaginal bleeding.    She reports that so far today the bleeding has filled up a light pad.  She never has any spotting or bleeding in between cycles.   She reports that when she walks she has upper abdominal pain and pain in her vulva.   Review of Systems  Positive: See above Negative:   Physical Exam  BP (!) 141/85 (BP Location: Right Arm)    Pulse 76    Temp 98.3 F (36.8 C) (Oral)    Resp 18    SpO2 98%  Gen:   Awake, no distress   Resp:  Normal effort  MSK:   Moves extremities without difficulty  Other:  Normal speech.   Medical Decision Making  Medically screening exam initiated at 6:31 PM.  Appropriate orders placed.  Diane Rice was informed that the remainder of the evaluation will be completed by another provider, this initial triage assessment does not replace that evaluation, and the importance of remaining in the ED until their evaluation is complete.     Diane Rice, [place] 09/16/21 1854

## 2021-09-16 NOTE — ED Notes (Signed)
Pt not answering to be roomed .  

## 2021-09-16 NOTE — Discharge Instructions (Addendum)
You have been evaluated for your symptoms.  Your hemoglobin is low today at 7.3.  This is related to both abnormal vaginal bleeding and underlying iron deficiency anemia.  Please take iron supplementation as prescribed and take Tylenol as needed for pain.  It is important for you to follow-up closely with your primary care doctor and with OB/GYN for further managements of your condition.  If you develop worsening bleeding, having lightheadedness worsening chest pain or shortness of breath please do not hesitate to return to the ER for further evaluation and assessment.  You may benefit from blood transfusion if your symptoms worsen.

## 2021-09-17 LAB — GC/CHLAMYDIA PROBE AMP (~~LOC~~) NOT AT ARMC
Chlamydia: NEGATIVE
Comment: NEGATIVE
Comment: NORMAL
Neisseria Gonorrhea: NEGATIVE

## 2021-09-23 ENCOUNTER — Telehealth (HOSPITAL_COMMUNITY): Payer: Self-pay | Admitting: Family Medicine

## 2021-09-23 NOTE — Telephone Encounter (Signed)
Patient called about refill for albuterol inhaler, she has used almost entire inhaler since her visit in December. Refill request verbally given to Dr. Marlinda Mike. Patient instructed to follow up with a PCP for better asthma control, informed that albuterol should be a rescue med only.

## 2022-01-25 ENCOUNTER — Encounter (HOSPITAL_COMMUNITY): Payer: Self-pay

## 2022-01-25 ENCOUNTER — Other Ambulatory Visit: Payer: Self-pay

## 2022-01-25 ENCOUNTER — Emergency Department (HOSPITAL_COMMUNITY)
Admission: EM | Admit: 2022-01-25 | Discharge: 2022-01-25 | Disposition: A | Discharge status: Home / Self Care | Payer: Medicaid Other | Attending: Emergency Medicine | Admitting: Emergency Medicine

## 2022-01-25 DIAGNOSIS — R1011 Right upper quadrant pain: Secondary | ICD-10-CM | POA: Insufficient documentation

## 2022-01-25 DIAGNOSIS — R112 Nausea with vomiting, unspecified: Secondary | ICD-10-CM | POA: Insufficient documentation

## 2022-01-25 LAB — URINALYSIS, ROUTINE W REFLEX MICROSCOPIC
Bilirubin Urine: NEGATIVE
Glucose, UA: NEGATIVE mg/dL
Hgb urine dipstick: NEGATIVE
Ketones, ur: NEGATIVE mg/dL
Nitrite: NEGATIVE
Protein, ur: NEGATIVE mg/dL
Specific Gravity, Urine: 1.02 (ref 1.005–1.030)
pH: 6 (ref 5.0–8.0)

## 2022-01-25 LAB — COMPREHENSIVE METABOLIC PANEL
ALT: 15 U/L (ref 0–44)
AST: 20 U/L (ref 15–41)
Albumin: 3.7 g/dL (ref 3.5–5.0)
Alkaline Phosphatase: 50 U/L (ref 38–126)
Anion gap: 9 (ref 5–15)
BUN: 11 mg/dL (ref 6–20)
CO2: 23 mmol/L (ref 22–32)
Calcium: 9.3 mg/dL (ref 8.9–10.3)
Chloride: 107 mmol/L (ref 98–111)
Creatinine, Ser: 0.78 mg/dL (ref 0.44–1.00)
GFR, Estimated: >60 mL/min (ref >60)
Glucose, Bld: 92 mg/dL (ref 70–99)
Potassium: 4.3 mmol/L (ref 3.5–5.1)
Sodium: 139 mmol/L (ref 135–145)
Total Bilirubin: 0.3 mg/dL (ref 0.3–1.2)
Total Protein: 7.2 g/dL (ref 6.5–8.1)

## 2022-01-25 LAB — CBC
HCT: 28.1 % — ABNORMAL LOW (ref 36.0–46.0)
Hemoglobin: 7.6 g/dL — ABNORMAL LOW (ref 12.0–15.0)
MCH: 19.9 pg — ABNORMAL LOW (ref 26.0–34.0)
MCHC: 27 g/dL — ABNORMAL LOW (ref 30.0–36.0)
MCV: 73.6 fL — ABNORMAL LOW (ref 80.0–100.0)
Platelets: 453 10*3/uL — ABNORMAL HIGH (ref 150–400)
RBC: 3.82 MIL/uL — ABNORMAL LOW (ref 3.87–5.11)
RDW: 19.8 % — ABNORMAL HIGH (ref 11.5–15.5)
WBC: 6.1 10*3/uL (ref 4.0–10.5)
nRBC: 0 % (ref 0.0–0.2)

## 2022-01-25 LAB — LIPASE, BLOOD: Lipase: 32 U/L (ref 11–51)

## 2022-01-25 LAB — I-STAT BETA HCG BLOOD, ED (MC, WL, AP ONLY): I-stat hCG, quantitative: <5 m[IU]/mL (ref <5)

## 2022-01-25 MED ORDER — ONDANSETRON 4 MG PO TBDP: 4.0000 mg | ORAL_TABLET | Freq: Three times a day (TID) | ORAL | 0 refills | Status: DC | PRN

## 2022-01-25 MED ORDER — ONDANSETRON 4 MG PO TBDP
4.0000 mg | ORAL_TABLET | Freq: Once | ORAL | Status: AC
Start: 2022-01-25 — End: 2022-01-25
  Administered 2022-01-25: 4 mg via ORAL
  Filled 2022-01-25: qty 1

## 2022-01-25 NOTE — ED Notes (Signed)
ED provider, Stevphen Meuse PA at nurses station with this RN. Pt walked over to nurses station and stated to [facility], "let me take a picture of you so I can get a different doctor to see me. You need to do more than touch my stomach," and walked self back to hallway bed before awaiting response from staff.

## 2022-01-25 NOTE — ED Notes (Signed)
Work excuse provided to pt

## 2022-01-25 NOTE — Discharge Instructions (Addendum)
Your work-up today was reassuringly without any significant abnormalities.  When that you take these nausea medicine I prescribed you and follow-up with a primary care provider.  I given you the information for 1 that does not require insurance since you have inform you that you do not have this.  Please return to the emergency room should you experience any new or concerning symptoms.  I recommend calling tomorrow morning to make an appointment at the [facility] health and wellness clinic.

## 2022-01-25 NOTE — ED Triage Notes (Signed)
Pt arrived to ED via POV w/ c/o RUQ "knot" that is painful x 1 week. Vomiting x 2 days. Pt in NAD A&Ox4 and VSS.

## 2022-01-25 NOTE — ED Notes (Addendum)
This RN brought discharge papers to pt at beside to discuss discharge information in packet. Pt continuously interrupted this RN to say that she demanded an x-ray of her abdomen and "no one has done this and I should be getting one as the patient who wants it." This RN attempted to explain to pt that ED providers do not order imaging exams against their clinical expertise and professional opinion, which are based on pt symptoms and work-up. Pt continued to interrupt this RN and stated that she needed a phone number to file complaint about provider, and refused to leave her bed without being provided phone number because "they probably won't give it to me if I ask for it." Phone number provided to pt. This RN told pt that she cannot continue to occupy bed space after discharged to call complaint phone number. Pt became further agitated and stated to this RN, "I'm seeing if the phone number works and is right, you don't have to stand over me and snap." At the time of this note, pt is requesting note for work.

## 2022-01-25 NOTE — ED Provider Notes (Signed)
[facility] EMERGENCY DEPARTMENT Provider Note   CSN: [id number] Arrival date & time: 01/25/22  1514     History  Chief Complaint  Patient presents with   Emesis   Abdominal Pain    Diane Rice is a 36 y.o. female.   Emesis Associated symptoms: abdominal pain   Abdominal Pain Associated symptoms: vomiting    Patient is a 36 year old female presented emergency room today's of nausea and vomiting she states that she has had nausea for the past few days.  She denies any abdominal pain although seems that she informed our triage RN that she did have some right upper quad abdominal pain earlier in the week.  She denies any fever or chills.  No lightheadedness or dizziness.  No cough congestion     Home Medications Prior to Admission medications   Medication Sig Start Date End Date Taking? Authorizing Provider  ondansetron (ZOFRAN-ODT) 4 MG disintegrating tablet Take 1 tablet (4 mg total) by mouth every 8 (eight) hours as needed for nausea or vomiting. 01/25/22  Yes [place] Sigmund S, PA  acetaminophen (TYLENOL) 500 MG tablet Take 1 tablet (500 mg total) by mouth every 6 (six) hours as needed. 09/16/21   Fayrene Helper, PA-C  albuterol (VENTOLIN HFA) 108 (90 Base) MCG/ACT inhaler Inhale 1-2 puffs into the lungs every 4 (four) hours as needed for wheezing or shortness of breath. 08/26/21   Zenia Resides, MD  benzonatate (TESSALON) 100 MG capsule Take 1 capsule (100 mg total) by mouth 3 (three) times daily as needed for cough. 05/08/21   Ward, Tylene Fantasia, PA-C  ferrous sulfate 325 (65 FE) MG tablet Take 1 tablet (325 mg total) by mouth daily. 09/16/21   Fayrene Helper, PA-C  rizatriptan (MAXALT) 10 MG tablet Take 1 tablet earliest onset of migraine.  May repeat in 2 hours if needed.  Maximum 2 tablets in 24 hours. 04/17/20   Drema Dallas, DO  topiramate (TOPAMAX) 25 MG tablet Take 1 tablet at bedtime for a week, then increase to 2 tablets at bedtime 04/17/20   Shon Millet R, DO  ARIPiprazole (ABILIFY) 5 MG tablet Take 1/2 tab daily for 1 week and than full tab daily Patient not taking: Reported on 03/22/2018 11/16/17 06/26/19  Arfeen, Phillips Grout, MD  sucralfate (CARAFATE) 1 g tablet Take 1 tablet (1 g total) by mouth 4 (four) times daily as needed. 01/06/19 06/26/19  Sabas Sous, MD      Allergies    Hydrocodone-acetaminophen    Review of Systems   Review of Systems  Gastrointestinal:  Positive for abdominal pain and vomiting.   Physical Exam Updated Vital Signs BP 119/72   Pulse 67   Temp 98 F (36.7 C) (Oral)   Resp 16   Ht 6' (1.829 m)   Wt 127 kg   SpO2 100%   BMI 37.97 kg/m  Physical Exam Vitals and nursing note reviewed.  Constitutional:      General: She is not in acute distress.    Appearance: She is obese.     Comments: Pleasant well-appearing 36 year old female in no acute distress.  Nontoxic.  Able answer questions appropriate follow commands.  HENT:     Head: Normocephalic and atraumatic.     Nose: Nose normal.  Eyes:     General: No scleral icterus. Cardiovascular:     Rate and Rhythm: Normal rate and regular rhythm.     Pulses: Normal pulses.     Heart sounds:  Normal heart sounds.  Pulmonary:     Effort: Pulmonary effort is normal. No respiratory distress.     Breath sounds: No wheezing.  Abdominal:     Palpations: Abdomen is soft.     Tenderness: There is no abdominal tenderness.     Comments: Abdomen is soft nontender.  Negative Murphy sign.  No epigastric or right upper quadrant tenderness.  No guarding or rebound.  No CVA tenderness.  Musculoskeletal:     Cervical back: Normal range of motion.     Right lower leg: No edema.     Left lower leg: No edema.     Comments: I inspected patient's posterior thorax and low back I do not see any boils or cellulitic areas.  There is no bruising or tenderness to my palpation.  Skin:    General: Skin is warm and dry.     Capillary Refill: Capillary refill takes less than 2  seconds.  Neurological:     Mental Status: She is alert. Mental status is at baseline.  Psychiatric:        Mood and Affect: Mood normal.        Behavior: Behavior normal.    ED Results / Procedures / Treatments   Labs (all labs ordered are listed, but only abnormal results are displayed) Labs Reviewed  CBC - Abnormal; Notable for the following components:      Result Value   RBC 3.82 (*)    Hemoglobin 7.6 (*)    HCT 28.1 (*)    MCV 73.6 (*)    MCH 19.9 (*)    MCHC 27.0 (*)    RDW 19.8 (*)    Platelets 453 (*)    All other components within normal limits  URINALYSIS, ROUTINE W REFLEX MICROSCOPIC - Abnormal; Notable for the following components:   Leukocytes,Ua TRACE (*)    Bacteria, UA RARE (*)    All other components within normal limits  LIPASE, BLOOD  COMPREHENSIVE METABOLIC PANEL  I-STAT BETA HCG BLOOD, ED ([facility], [facility], [facility] ONLY)    EKG None  Radiology No results found.  Procedures Procedures    Medications Ordered in ED Medications  ondansetron (ZOFRAN-ODT) disintegrating tablet 4 mg (4 mg Oral Given 01/25/22 1827)    ED Course/ Medical Decision Making/ A&P                           Medical Decision Making Amount and/or Complexity of Data Reviewed Labs: ordered.  Risk Prescription drug management.   This patient presents to the ED for concern of nausea/vomiting and abd pain, this involves a number of treatment options, and is a complaint that carries with it a high risk of complications and morbidity.  The differential diagnosis includes The causes of generalized abdominal pain include but are not limited to AAA, mesenteric ischemia, appendicitis, diverticulitis, DKA, gastritis, gastroenteritis, AMI, nephrolithiasis, pancreatitis, peritonitis, adrenal insufficiency,lead poisoning, iron toxicity, intestinal ischemia, constipation, UTI,SBO/LBO, splenic rupture, biliary disease, IBD, IBS, PUD, or hepatitis. Ectopic pregnancy, ovarian torsion, PID.    Co  morbidities: Discussed in HPI   Brief History:  Patient is a 36 year old female presented emergency room today's of nausea and vomiting she states that she has had nausea for the past few days.  She denies any abdominal pain although seems that she informed our triage RN that she did have some right upper quad abdominal pain earlier in the week.  She denies any fever or chills.  No  lightheadedness or dizziness.  No cough congestion      EMR reviewed including pt PMHx, past surgical history and past visits to ER.   See HPI for more details   Lab Tests:    I personally reviewed all laboratory work and imaging.  Metabolic panel without any acute abnormality specifically kidney function within normal limits and no significant electrolyte abnormalities. CBC with chronic consistent anemia.  Actually somewhat improved from last labs.  Urinalysis unremarkable red bacteria trace leukocytes but no urinary symptoms.  I states she did negative for pregnancy.  Lipase within normal limits.   Imaging Studies:      Cardiac Monitoring:  NA NA   Medicines ordered:  I ordered medication including Zofran for nausea Reevaluation of the patient after these medicines showed that the patient resolved I have reviewed the patients home medicines and have made adjustments as needed   Critical Interventions:     Consults/Attending Physician   I discussed this case with my attending physician who cosigned this note including patient's presenting symptoms, physical exam, and planned diagnostics and interventions. Attending physician stated agreement with plan or made changes to plan which were implemented.   Reevaluation:  On my reevaluation patient endorses some concerns that nothing was done.  I provided reassurance that I have checked labs which are reassuring.  I also reassured her that with a benign abdominal exam likelihood of any acute intra-abdominal infection is low and that  the harm of radiation from CT imaging is high.   Social Determinants of Health:  I provided patient with information for the [facility] and wellness clinic it was a result of her informing me that she does not have insurance and does not have a primary care provider.    Problem List / ED Course:  Nausea vomiting seems to have resolved before my evaluation.  She has some questions about lumps on her back which she states have now resolved.  I do not appreciate any lumps on her back.  I did recommend that she follow-up with her PCP.  It seems that she does not currently have 1 and I provided her with information [facility]. I personally reviewed all labs.  I do not see any indication for imaging given that she is not experiencing any abdominal tenderness while I examine her.  Ultimately she will benefit from outpatient follow-up.   Dispostion:  After consideration of the diagnostic results and the patients response to treatment, I feel that the patent would benefit from Zofran as needed, close outpatient follow-up and return to the ER should she experience any new or concerning symptoms.   Final Clinical Impression(s) / ED Diagnoses Final diagnoses:  Nausea and vomiting, unspecified vomiting type    Rx / DC Orders ED Discharge Orders          Ordered    ondansetron (ZOFRAN-ODT) 4 MG disintegrating tablet  Every 8 hours PRN        01/25/22 1915              Gailen ShelterFondaw, Rhian Funari S, [place] 01/25/22 2244    Cathren LaineSteinl, Kevin, MD 01/26/22 2132

## 2022-01-25 NOTE — ED Notes (Signed)
Pt did not want discharge vitals taken

## 2022-01-25 NOTE — ED Notes (Signed)
This RN informed ED attending provider, Dr. Denton Lank, of pt's request to see second ED provider prior to leaving ED

## 2022-02-02 IMAGING — DX DG CHEST 2V
2 series · 2 of 2 positions shown · non-contrast
Comparison: August 25, 2021

CLINICAL DATA: Chest pain and lumps to her right breast.

EXAM:
CHEST - 2 VIEW

[w chest pa]
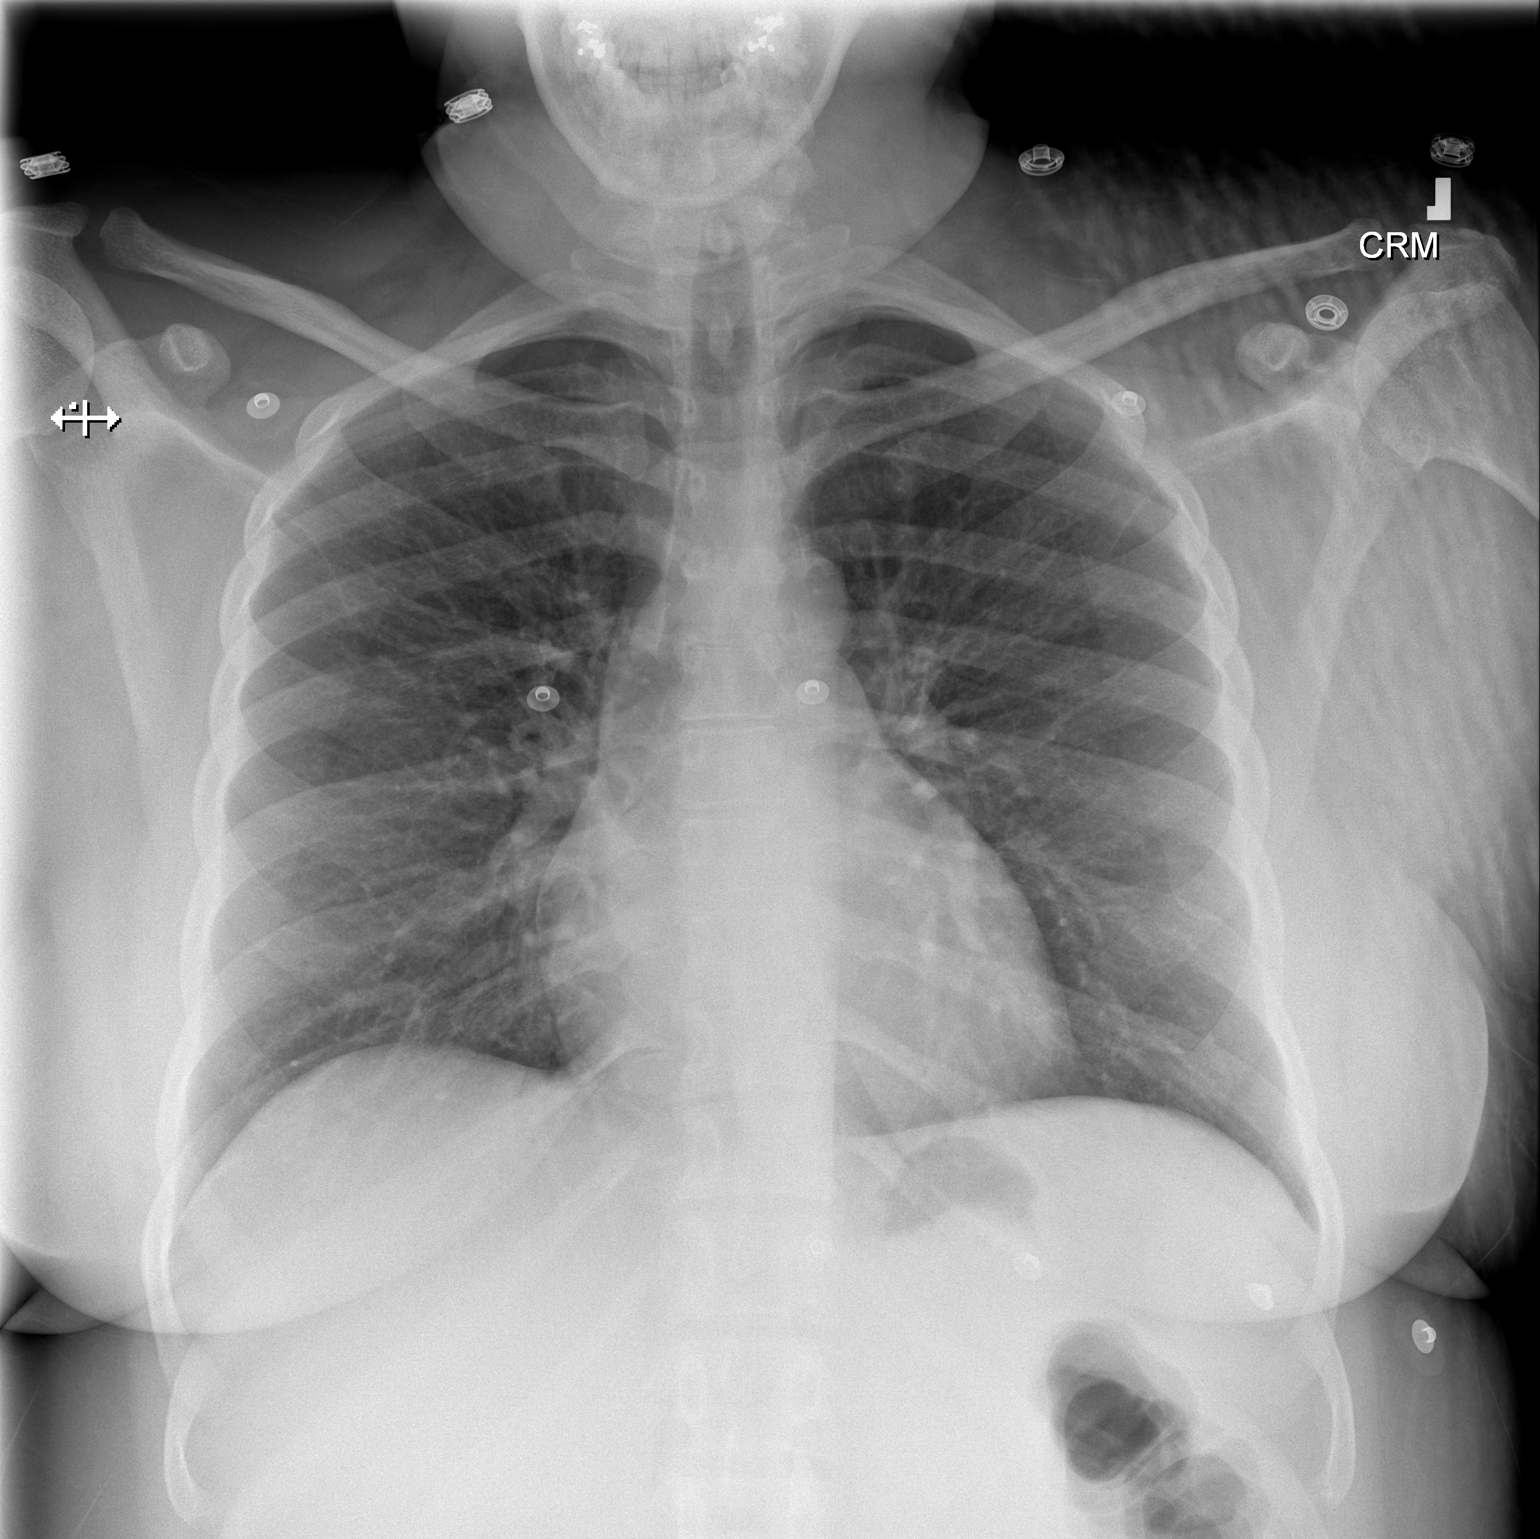

[w chest lat]
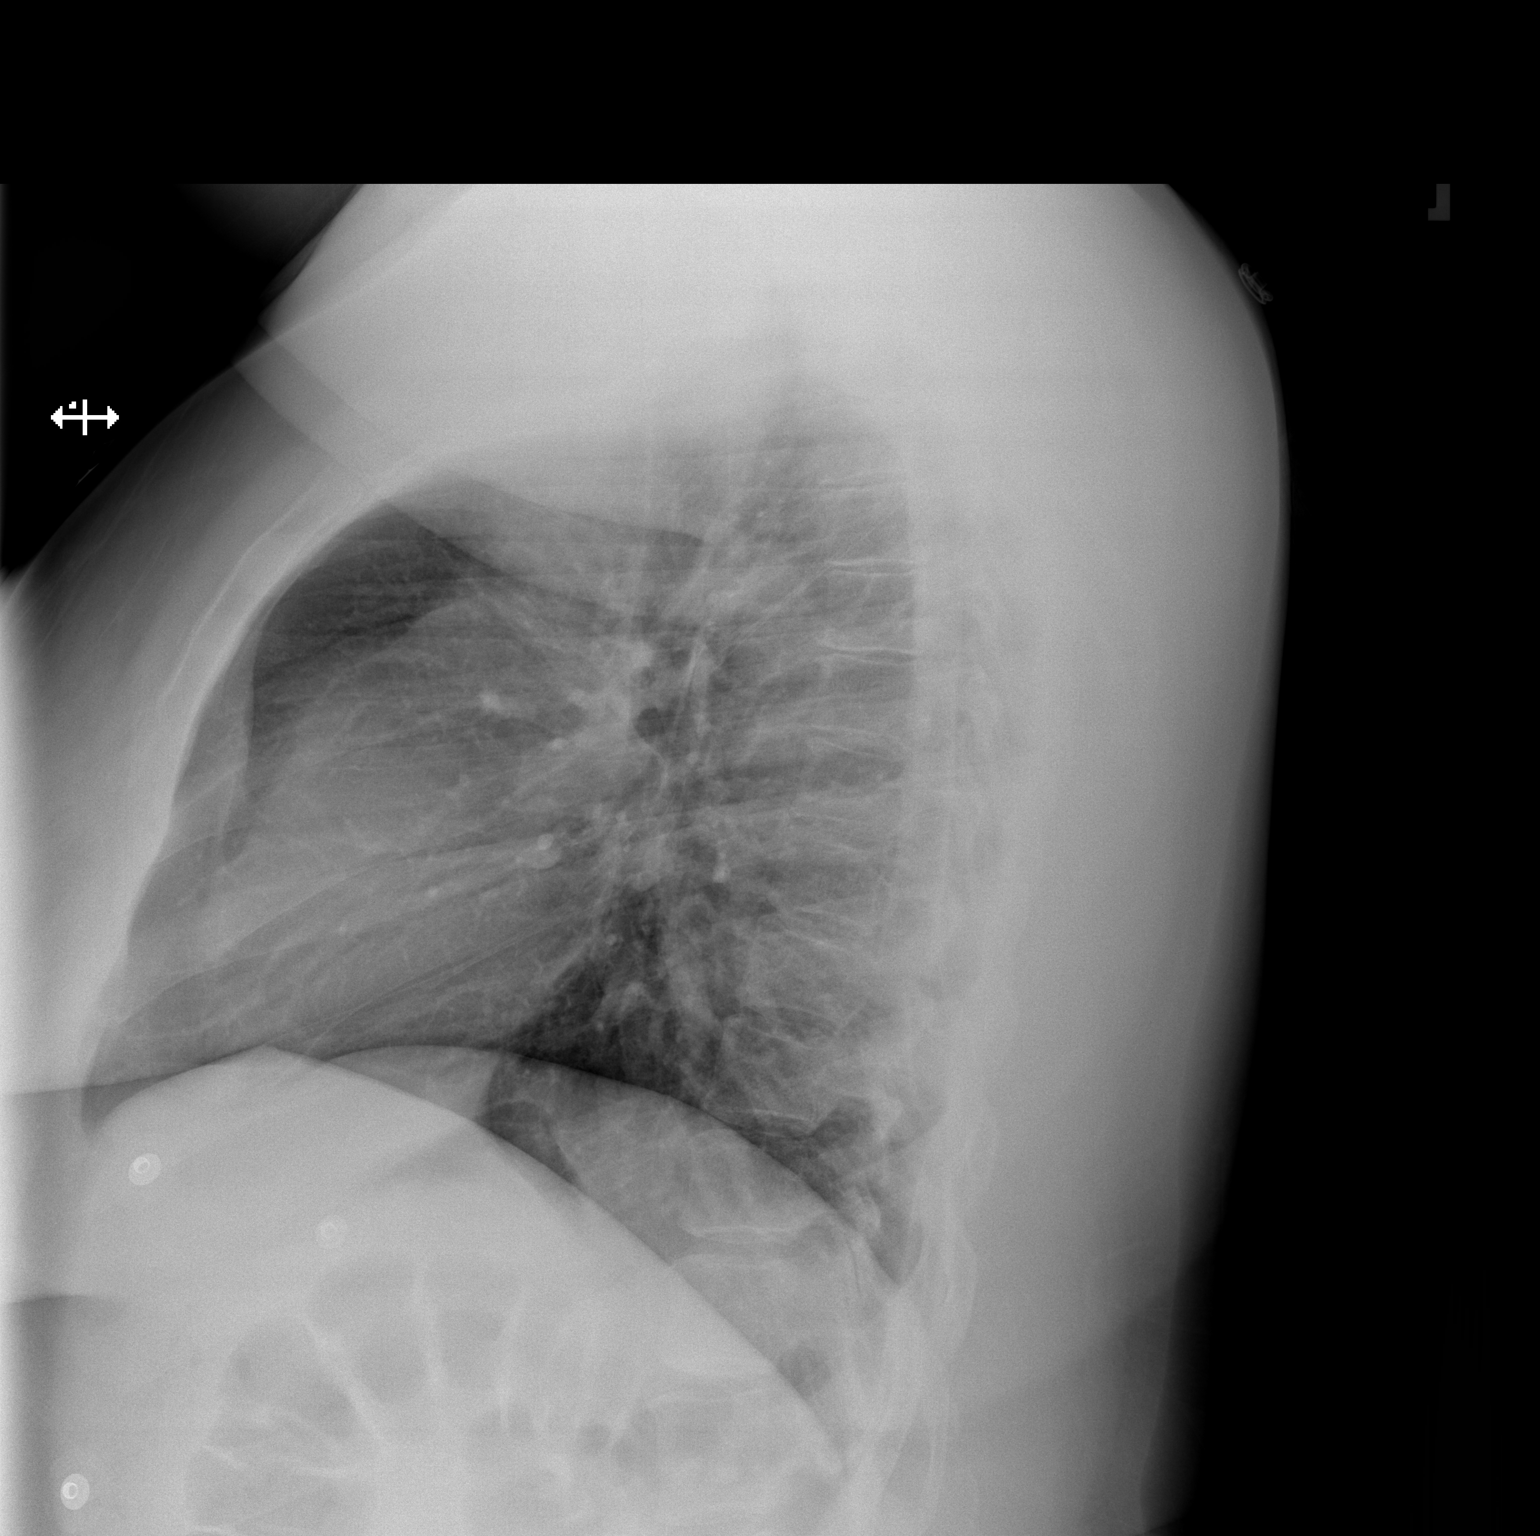

[2 of 2 positions shown; findings below may reference images not displayed]

FINDINGS: The heart size and mediastinal contours are within normal limits.
Both lungs are clear. The visualized skeletal structures are
unremarkable.
IMPRESSION: No active cardiopulmonary disease.

## 2022-06-07 ENCOUNTER — Encounter (HOSPITAL_COMMUNITY): Payer: Self-pay | Admitting: Emergency Medicine

## 2022-06-07 ENCOUNTER — Emergency Department (HOSPITAL_COMMUNITY): Payer: Commercial Managed Care - HMO

## 2022-06-07 ENCOUNTER — Emergency Department (HOSPITAL_COMMUNITY)
Admission: EM | Admit: 2022-06-07 | Discharge: 2022-06-07 | Disposition: A | Discharge status: Home / Self Care | Payer: Commercial Managed Care - HMO | Attending: Emergency Medicine | Admitting: Emergency Medicine

## 2022-06-07 DIAGNOSIS — J45909 Unspecified asthma, uncomplicated: Secondary | ICD-10-CM | POA: Diagnosis not present

## 2022-06-07 DIAGNOSIS — N9489 Other specified conditions associated with female genital organs and menstrual cycle: Secondary | ICD-10-CM | POA: Diagnosis not present

## 2022-06-07 DIAGNOSIS — N939 Abnormal uterine and vaginal bleeding, unspecified: Secondary | ICD-10-CM

## 2022-06-07 DIAGNOSIS — R102 Pelvic and perineal pain: Secondary | ICD-10-CM | POA: Diagnosis not present

## 2022-06-07 DIAGNOSIS — I10 Essential (primary) hypertension: Secondary | ICD-10-CM | POA: Insufficient documentation

## 2022-06-07 LAB — CBC
HCT: 25.7 % — ABNORMAL LOW (ref 36.0–46.0)
Hemoglobin: 7.3 g/dL — ABNORMAL LOW (ref 12.0–15.0)
MCH: 20 pg — ABNORMAL LOW (ref 26.0–34.0)
MCHC: 28.4 g/dL — ABNORMAL LOW (ref 30.0–36.0)
MCV: 70.4 fL — ABNORMAL LOW (ref 80.0–100.0)
Platelets: 362 10*3/uL (ref 150–400)
RBC: 3.65 MIL/uL — ABNORMAL LOW (ref 3.87–5.11)
RDW: 20.9 % — ABNORMAL HIGH (ref 11.5–15.5)
WBC: 7 10*3/uL (ref 4.0–10.5)
nRBC: 0 % (ref 0.0–0.2)

## 2022-06-07 LAB — URINALYSIS, ROUTINE W REFLEX MICROSCOPIC
Bilirubin Urine: NEGATIVE
Glucose, UA: NEGATIVE mg/dL
Hgb urine dipstick: NEGATIVE
Ketones, ur: NEGATIVE mg/dL
Leukocytes,Ua: NEGATIVE
Nitrite: NEGATIVE
Protein, ur: NEGATIVE mg/dL
Specific Gravity, Urine: 1.025 (ref 1.005–1.030)
pH: 5 (ref 5.0–8.0)

## 2022-06-07 LAB — COMPREHENSIVE METABOLIC PANEL
ALT: 21 U/L (ref 0–44)
AST: 24 U/L (ref 15–41)
Albumin: 3.8 g/dL (ref 3.5–5.0)
Alkaline Phosphatase: 62 U/L (ref 38–126)
Anion gap: 7 (ref 5–15)
BUN: 9 mg/dL (ref 6–20)
CO2: 25 mmol/L (ref 22–32)
Calcium: 9.1 mg/dL (ref 8.9–10.3)
Chloride: 105 mmol/L (ref 98–111)
Creatinine, Ser: 0.77 mg/dL (ref 0.44–1.00)
GFR, Estimated: >60 mL/min (ref >60)
Glucose, Bld: 114 mg/dL — ABNORMAL HIGH (ref 70–99)
Potassium: 4.1 mmol/L (ref 3.5–5.1)
Sodium: 137 mmol/L (ref 135–145)
Total Bilirubin: 0.5 mg/dL (ref 0.3–1.2)
Total Protein: 7.6 g/dL (ref 6.5–8.1)

## 2022-06-07 LAB — I-STAT BETA HCG BLOOD, ED (MC, WL, AP ONLY): I-stat hCG, quantitative: <5 m[IU]/mL (ref <5)

## 2022-06-07 MED ORDER — FERROUS SULFATE 325 (65 FE) MG PO TABS: 325.0000 mg | ORAL_TABLET | Freq: Three times a day (TID) | ORAL | 0 refills | Status: DC

## 2022-06-07 NOTE — Discharge Instructions (Signed)
We evaluated you for your vaginal bleeding.  We discussed treatment such as oral hormones but you declined this for religious reasons.  Your red blood cell level is low, please start taking iron.  Please also follow-up closely with an obstetrician/gynecologist for further management.  You may need surgical treatment since you cannot take hormonal treatment.  Please return if your symptoms worsen, you develop weakness or lightheadedness, or any other new symptoms as you may need a blood transfusion.

## 2022-06-07 NOTE — ED Provider Triage Note (Signed)
Emergency Medicine Provider Triage Evaluation Note  Diane Rice , a 36 y.o. female  was evaluated in triage.  Pt complains of vaginal bleeding. The patient reports that she has a typical menstrual cycle and finished last week. She reports that a few days ago she had intercourse with her husband and bled some that day. She reports that she feels like she is starting her cycle again yesterday and has been having lower abdominal and back cramping. Denies any fevers or dysuria. She reports that she has on the same pad and regular tampon since this morning. Denies any clots.   Review of Systems  Positive:  Negative:   Physical Exam  BP 129/81   Pulse 81   Temp 98.1 F (36.7 C) (Oral)   Resp 14   SpO2 100%  Gen:   Awake, no distress   Resp:  Normal effort  MSK:   Moves extremities without difficulty  Other:  Abdomen is soft and non tender.   Medical Decision Making  Medically screening exam initiated at 10:23 AM.  Appropriate orders placed.  Paul Torpey was informed that the remainder of the evaluation will be completed by another provider, this initial triage assessment does not replace that evaluation, and the importance of remaining in the ED until their evaluation is complete.  Will order labs and [place]    Sherrell Puller, [place] 06/07/22 1025

## 2022-06-07 NOTE — ED Notes (Signed)
RN reviewed need for follow up care with patient prior to leaving, patient left room prior to receiving formal discharge paperwork. ED Provider did make patient aware of iron supplement called in to pharmacy.

## 2022-06-07 NOTE — ED Provider Notes (Signed)
[facility] EMERGENCY DEPARTMENT Provider Note  CSN: [id number] Arrival date & time: 06/07/22 3419  Chief Complaint(s) Vaginal Bleeding  HPI Diane Rice is a 36 y.o. female with history of chronic anemia, hypertension presenting with ongoing vaginal bleeding.  She reports that she finished her menstrual cycle last week, had a recurrence of vaginal bleeding the past few days.  She reports feeling generalized weakness.  No nausea, vomiting, no significant abdominal pain, does have some lower abdominal cramping.  Denies fevers or chills.  No vaginal discharge.  No syncope.  No easy bleeding or bruising elsewhere   Past Medical History Past Medical History:  Diagnosis Date   Anemia    Anemia    Asthma    Back pain, chronic    Hypertension    Muscle spasm    Patient Active Problem List   Diagnosis Date Noted   OBESITY 03/19/2010   ANEMIA, MILD 03/19/2010   BACK PAIN 03/19/2010   VITAMIN D DEFICIENCY 01/01/2010   ANEMIA-IRON DEFICIENCY 12/30/2009   DEPRESSION 12/30/2009   CHRONIC PAIN SYNDROME 12/30/2009   VIRAL URI 12/30/2009   ASTHMA 12/30/2009   Home Medication(s) Prior to Admission medications   Medication Sig Start Date End Date Taking? Authorizing Provider  acetaminophen (TYLENOL) 500 MG tablet Take 1 tablet (500 mg total) by mouth every 6 (six) hours as needed. 09/16/21   Fayrene Helper, PA-C  albuterol (VENTOLIN HFA) 108 (90 Base) MCG/ACT inhaler Inhale 1-2 puffs into the lungs every 4 (four) hours as needed for wheezing or shortness of breath. 08/26/21   Zenia Resides, MD  benzonatate (TESSALON) 100 MG capsule Take 1 capsule (100 mg total) by mouth 3 (three) times daily as needed for cough. 05/08/21   Ward, Tylene Fantasia, PA-C  ferrous sulfate 325 (65 FE) MG tablet Take 1 tablet (325 mg total) by mouth 3 (three) times daily with meals. 06/07/22   Lonell Grandchild, MD  ondansetron (ZOFRAN-ODT) 4 MG disintegrating tablet Take 1 tablet (4 mg total) by  mouth every 8 (eight) hours as needed for nausea or vomiting. 01/25/22   Gailen Shelter, PA  rizatriptan (MAXALT) 10 MG tablet Take 1 tablet earliest onset of migraine.  May repeat in 2 hours if needed.  Maximum 2 tablets in 24 hours. 04/17/20   Drema Dallas, DO  topiramate (TOPAMAX) 25 MG tablet Take 1 tablet at bedtime for a week, then increase to 2 tablets at bedtime 04/17/20   Shon Millet R, DO  ARIPiprazole (ABILIFY) 5 MG tablet Take 1/2 tab daily for 1 week and than full tab daily Patient not taking: Reported on 03/22/2018 11/16/17 06/26/19  Arfeen, Phillips Grout, MD  sucralfate (CARAFATE) 1 g tablet Take 1 tablet (1 g total) by mouth 4 (four) times daily as needed. 01/06/19 06/26/19  Sabas Sous, MD  Past Surgical History Past Surgical History:  Procedure Laterality Date   MOLE REMOVAL     WISDOM TOOTH EXTRACTION     Family History Family History  Problem Relation Age of Onset   Depression Mother    Bipolar disorder Sister     Social History Social History   Tobacco Use   Smoking status: Never   Smokeless tobacco: Never  Vaping Use   Vaping Use: Never used  Substance Use Topics   Alcohol use: Yes    Alcohol/week: 1.0 - 2.0 standard drink of alcohol    Types: 1 - 2 Cans of beer per week    Comment: socially   Drug use: No   Allergies Hydrocodone-acetaminophen  Review of Systems Review of Systems  All other systems reviewed and are negative.   Physical Exam Vital Signs  I have reviewed the triage vital signs BP 125/71   Pulse 66   Temp 98.1 F (36.7 C) (Oral)   Resp 16   SpO2 100%  Physical Exam Vitals and nursing note reviewed.  Constitutional:      General: She is not in acute distress.    Appearance: She is well-developed.  HENT:     Head: Normocephalic and atraumatic.     Mouth/Throat:     Mouth: Mucous membranes are moist.   Eyes:     Pupils: Pupils are equal, round, and reactive to light.  Cardiovascular:     Rate and Rhythm: Normal rate and regular rhythm.     Heart sounds: No murmur heard. Pulmonary:     Effort: Pulmonary effort is normal. No respiratory distress.     Breath sounds: Normal breath sounds.  Abdominal:     General: Abdomen is flat.     Palpations: Abdomen is soft.     Tenderness: There is no abdominal tenderness.  Musculoskeletal:        General: No tenderness.     Right lower leg: No edema.     Left lower leg: No edema.  Skin:    General: Skin is warm and dry.  Neurological:     General: No focal deficit present.     Mental Status: She is alert. Mental status is at baseline.  Psychiatric:        Mood and Affect: Mood normal.        Behavior: Behavior normal.     ED Results and Treatments Labs (all labs ordered are listed, but only abnormal results are displayed) Labs Reviewed  COMPREHENSIVE METABOLIC PANEL - Abnormal; Notable for the following components:      Result Value   Glucose, Bld 114 (*)    All other components within normal limits  CBC - Abnormal; Notable for the following components:   RBC 3.65 (*)    Hemoglobin 7.3 (*)    HCT 25.7 (*)    MCV 70.4 (*)    MCH 20.0 (*)    MCHC 28.4 (*)    RDW 20.9 (*)    All other components within normal limits  URINALYSIS, ROUTINE W REFLEX MICROSCOPIC - Abnormal; Notable for the following components:   APPearance HAZY (*)    All other components within normal limits  I-STAT BETA HCG BLOOD, ED ([facility], [facility], [facility] ONLY)  Radiology US PELVIC COMPLETE W TRANSVAGINAL AND TORSION R/O  Result Date: 06/07/2022 CLINICAL DATA:  Vaginal bleeding and pain after intercourse. EXAM: TRANSABDOMINAL AND TRANSVAGINAL ULTRASOUND OF PELVIS DOPPLER ULTRASOUND OF OVARIES TECHNIQUE: Both transabdominal and transvaginal ultrasound  examinations of the pelvis were performed. Transabdominal technique was performed for global imaging of the pelvis including uterus, ovaries, adnexal regions, and pelvic cul-de-sac. It was necessary to proceed with endovaginal exam following the transabdominal exam to visualize the endometrium and ovaries. Color and duplex Doppler ultrasound was utilized to evaluate blood flow to the ovaries. COMPARISON:  None Available. FINDINGS: Uterus Measurements: 9.0 x 5.0 x 5.9 cm = volume: 138 mL. No fibroids or other mass visualized. Endometrium Thickness: 11.8 mm. No focal abnormality visualized. Somewhat heterogeneous with mild increased vascularity. Right ovary Measurements: 2.5 x 1.2 x 1.9 cm = volume: 3.0 mL. Normal appearance/no adnexal mass. Left ovary Measurements: 2.8 x 2.0 x 1.8 cm = volume: 5.1 mL. Normal appearance/no adnexal mass. Pulsed Doppler evaluation of both ovaries demonstrates normal low-resistance arterial and venous waveforms. Other findings No abnormal free fluid. IMPRESSION: 1. Normal uterus. 2. Normal ovaries with normal vascular flow. 3. Normal endometrial thickness with nonspecific mild heterogeneity and mild increased vascularity. Electronically Signed   By: Marin Olp M.D.   On: 06/07/2022 12:07    Pertinent labs & imaging results that were available during my care of the patient were reviewed by me and considered in my medical decision making (see MDM for details).  Medications Ordered in ED Medications - No data to display                                                                                                                                   Procedures Procedures  (including critical care time)  Medical Decision Making / ED Course   MDM:  36 year old female presenting with vaginal bleeding.  Patient well-appearing, vital signs reassuring.  Suspect likely endometrial dysfunction.  Ultrasound does not show adenomyosis, leiomyoma.  Patient is obese which can cause  this.  No history of other bleeding to suggest coagulopathy.  Her hemoglobin is low at 7.3 but on review of records it is nearly always in this range.  She is not currently taking iron supplementation.  Will prescribe iron supplementation.  I recommended medroxyprogesterone taper given anemia and ongoing bleeding, patient refused as she was concerned that this was birth control, discussed with patient that this would be for an alternative indication but she is still not interested in this medicine.  Advise close follow-up with OB/GYN and starting iron therapy.  Gave strict return precautions for any worsening symptoms given already significant anemia.  Considered hospitalization but given patient's anemia is around baseline and she has stable vital signs will discharge to home. All questions answered. Patient comfortable with plan of discharge. Return precautions discussed with patient and specified on the after visit summary.  Additional history obtained: -Additional history obtained from family -External records from outside source obtained and reviewed including: Chart review including previous notes, labs, imaging, consultation notes   Lab Tests: -I ordered, reviewed, and interpreted labs.   The pertinent results include:   Labs Reviewed  COMPREHENSIVE METABOLIC PANEL - Abnormal; Notable for the following components:      Result Value   Glucose, Bld 114 (*)    All other components within normal limits  CBC - Abnormal; Notable for the following components:   RBC 3.65 (*)    Hemoglobin 7.3 (*)    HCT 25.7 (*)    MCV 70.4 (*)    MCH 20.0 (*)    MCHC 28.4 (*)    RDW 20.9 (*)    All other components within normal limits  URINALYSIS, ROUTINE W REFLEX MICROSCOPIC - Abnormal; Notable for the following components:   APPearance HAZY (*)    All other components within normal limits  I-STAT BETA HCG BLOOD, ED ([facility], [facility], [facility] ONLY)      EKG   EKG Interpretation  Date/Time:     Ventricular Rate:    PR Interval:    QRS Duration:   QT Interval:    QTC Calculation:   R Axis:     Text Interpretation:           Imaging Studies ordered: I ordered imaging studies including US pelvis On my interpretation imaging demonstrates no acute process I independently visualized and interpreted imaging. I agree with the radiologist interpretation   Medicines ordered and prescription drug management: Meds ordered this encounter  Medications   ferrous sulfate 325 (65 FE) MG tablet    Sig: Take 1 tablet (325 mg total) by mouth 3 (three) times daily with meals.    Dispense:  90 tablet    Refill:  0    -I have reviewed the patients home medicines and have made adjustments as needed     Cardiac Monitoring: The patient was maintained on a cardiac monitor.  I personally viewed and interpreted the cardiac monitored which showed an underlying rhythm of: NSR  Social Determinants of Health:  Factors impacting patients care include: religious beliefs   Co morbidities that complicate the patient evaluation  Past Medical History:  Diagnosis Date   Anemia    Anemia    Asthma    Back pain, chronic    Hypertension    Muscle spasm       Dispostion: Discharge    Final Clinical Impression(s) / ED Diagnoses Final diagnoses:  Vaginal bleeding     This chart was dictated using voice recognition software.  Despite best efforts to proofread,  errors can occur which can change the documentation meaning.    Lonell Grandchild, MD 06/07/22 1501

## 2022-06-07 NOTE — ED Triage Notes (Signed)
Patient here with complaint of intermittent vaginal bleeding that started approximately one and a half weeks ago. Patient also reports lower back and lower abdominal pain that is described as similar to menstrual pain. Patient is alert, oriented, and in no apparent distress at this time.

## 2022-06-23 ENCOUNTER — Ambulatory Visit (INDEPENDENT_AMBULATORY_CARE_PROVIDER_SITE_OTHER): Payer: Commercial Managed Care - HMO | Admitting: Student

## 2022-06-23 VITALS — BP 119/58 | HR 76 | Ht 72.0 in | Wt 242.9 lb

## 2022-06-23 DIAGNOSIS — F319 Bipolar disorder, unspecified: Secondary | ICD-10-CM | POA: Diagnosis not present

## 2022-06-23 DIAGNOSIS — G894 Chronic pain syndrome: Secondary | ICD-10-CM | POA: Diagnosis not present

## 2022-06-23 DIAGNOSIS — D500 Iron deficiency anemia secondary to blood loss (chronic): Secondary | ICD-10-CM

## 2022-06-23 DIAGNOSIS — N946 Dysmenorrhea, unspecified: Secondary | ICD-10-CM

## 2022-06-23 DIAGNOSIS — F39 Unspecified mood [affective] disorder: Secondary | ICD-10-CM

## 2022-06-23 NOTE — Patient Instructions (Addendum)
Bipolar/Anxiety/PTSD/Depression We will send a referral to psychiatry.   Vaginal bleeding We will check iron studies Continue oral iron supplementation If your iron is very low you may need an iron infusion Please follow up with OBGYN   Chronic pain You mood and iron level can cause you to have pain all over, we will treat you for both and if you are still having pain we can discuss this further  Follow up in 2 weeks

## 2022-06-24 ENCOUNTER — Other Ambulatory Visit: Payer: Self-pay | Admitting: Neurology

## 2022-06-24 LAB — IRON,TIBC AND FERRITIN PANEL
Ferritin: 5 ng/mL — ABNORMAL LOW (ref 15–150)
Iron Saturation: 5 % — CL (ref 15–55)
Iron: 24 ug/dL — ABNORMAL LOW (ref 27–159)
Total Iron Binding Capacity: 456 ug/dL — ABNORMAL HIGH (ref 250–450)
UIBC: 432 ug/dL — ABNORMAL HIGH (ref 131–425)

## 2022-06-27 ENCOUNTER — Encounter: Payer: Self-pay | Admitting: Student

## 2022-06-30 ENCOUNTER — Other Ambulatory Visit: Payer: Self-pay | Admitting: Neurology

## 2022-06-30 ENCOUNTER — Other Ambulatory Visit (HOSPITAL_COMMUNITY): Payer: Self-pay | Admitting: *Deleted

## 2022-07-01 ENCOUNTER — Inpatient Hospital Stay (HOSPITAL_COMMUNITY): Admission: RE | Admit: 2022-07-01 | Payer: Commercial Managed Care - HMO | Source: Ambulatory Visit

## 2022-07-04 ENCOUNTER — Other Ambulatory Visit: Payer: Self-pay | Admitting: Neurology

## 2022-07-05 ENCOUNTER — Inpatient Hospital Stay (HOSPITAL_COMMUNITY): Admission: RE | Admit: 2022-07-05 | Payer: Commercial Managed Care - HMO | Source: Ambulatory Visit

## 2022-07-06 ENCOUNTER — Encounter (HOSPITAL_COMMUNITY): Payer: Commercial Managed Care - HMO | Admitting: Psychiatry

## 2022-07-06 ENCOUNTER — Encounter (HOSPITAL_COMMUNITY): Payer: Self-pay

## 2022-07-06 DIAGNOSIS — F4310 Post-traumatic stress disorder, unspecified: Secondary | ICD-10-CM | POA: Insufficient documentation

## 2022-07-06 NOTE — Progress Notes (Signed)
This encounter was created in error - please disregard.

## 2022-07-08 ENCOUNTER — Encounter (HOSPITAL_COMMUNITY): Payer: Commercial Managed Care - HMO

## 2022-07-09 ENCOUNTER — Other Ambulatory Visit: Payer: Self-pay

## 2022-07-09 ENCOUNTER — Encounter: Payer: Self-pay | Admitting: Student

## 2022-07-09 ENCOUNTER — Ambulatory Visit (INDEPENDENT_AMBULATORY_CARE_PROVIDER_SITE_OTHER): Payer: Commercial Managed Care - HMO | Admitting: Student

## 2022-07-09 VITALS — BP 127/75 | HR 71 | Temp 98.3°F | Ht 72.0 in | Wt 292.3 lb

## 2022-07-09 DIAGNOSIS — G43909 Migraine, unspecified, not intractable, without status migrainosus: Secondary | ICD-10-CM | POA: Diagnosis not present

## 2022-07-09 DIAGNOSIS — M545 Low back pain, unspecified: Secondary | ICD-10-CM

## 2022-07-09 DIAGNOSIS — Z5900 Homelessness unspecified: Secondary | ICD-10-CM | POA: Insufficient documentation

## 2022-07-09 DIAGNOSIS — Z0000 Encounter for general adult medical examination without abnormal findings: Secondary | ICD-10-CM

## 2022-07-09 DIAGNOSIS — Z1159 Encounter for screening for other viral diseases: Secondary | ICD-10-CM | POA: Diagnosis not present

## 2022-07-09 DIAGNOSIS — D509 Iron deficiency anemia, unspecified: Secondary | ICD-10-CM

## 2022-07-09 MED ORDER — METHOCARBAMOL 750 MG PO TABS: 1500.0000 mg | ORAL_TABLET | Freq: Three times a day (TID) | ORAL | 0 refills | Status: DC

## 2022-07-09 MED ORDER — TOPIRAMATE 25 MG PO TABS: ORAL_TABLET | ORAL | 0 refills | Status: DC

## 2022-07-09 MED ORDER — FERROUS SULFATE 325 (65 FE) MG PO TABS: 325.0000 mg | ORAL_TABLET | ORAL | 1 refills | Status: DC

## 2022-07-09 MED ORDER — RIZATRIPTAN BENZOATE 10 MG PO TABS: ORAL_TABLET | ORAL | 5 refills | Status: DC

## 2022-07-09 NOTE — Assessment & Plan Note (Signed)
-  she has appointment with OBGYN for pap smear  -deferred Tdap today, would consider it at next visit  -unable to collect hepatitis C for screening today, future order placed for next visit

## 2022-07-09 NOTE — Assessment & Plan Note (Signed)
Iron/TIBC/Ferritin/ %Sat    Component Value Date/Time   IRON 24 (L) 06/23/2022 1659   TIBC 456 (H) 06/23/2022 1659   FERRITIN 5 (L) 06/23/2022 1659   IRONPCTSAT 5 (LL) 06/23/2022 1659   Patient with hx of IDA. Seen in clinic on 10/25 for vaginal bleeding, iron panel and ferritin low. She was supposed to receive iron infusions but has yet to complete it. Has infusion scheduled for next week. She has not been taking the oral iron supplement. Endorses continued fatigue and low energy. Exam today showed conjunctival and tongue pallor. Advised patient to get the iron infusion and restart oral iron supplementation.   Plan -has scheduled iron infusion on 11/17 -refilled ferrous sulfate 325 mg every other day -reassess at next follow up

## 2022-07-09 NOTE — Assessment & Plan Note (Signed)
Patient reports hx of chronic low back pain. She feels the pain is sharp like spasms. Does not radiate down LE. Denies numbness or weakness of LE. No other red flag symptoms. She has tried tylenol and ibuprofen with some brief improvement. Exam today showed lumbar paraspinal tenderness.  Plan -continue with tylenol and ibuprofen -sent Robaxin 1500 mg TID (30 tablets)

## 2022-07-09 NOTE — Assessment & Plan Note (Signed)
Patient reports being homeless and would like to discuss about resources. She would benefit from referral to social work/care coordination.   Plan -referral to community care coordination

## 2022-07-09 NOTE — Progress Notes (Addendum)
CC: Medication refill  HPI:  Diane Rice is a 36 y.o. female living with a history stated below and presents today for medication refill. Please see problem based assessment and plan for additional details.  Past Medical History:  Diagnosis Date   Anemia    Anemia    Asthma    Back pain, chronic    Hypertension    Muscle spasm     Current Outpatient Medications on File Prior to Visit  Medication Sig Dispense Refill   acetaminophen (TYLENOL) 500 MG tablet Take 1 tablet (500 mg total) by mouth every 6 (six) hours as needed. 30 tablet 0   albuterol (VENTOLIN HFA) 108 (90 Base) MCG/ACT inhaler Inhale 1-2 puffs into the lungs every 4 (four) hours as needed for wheezing or shortness of breath. 1 each 0   ondansetron (ZOFRAN-ODT) 4 MG disintegrating tablet Take 1 tablet (4 mg total) by mouth every 8 (eight) hours as needed for nausea or vomiting. 20 tablet 0   [DISCONTINUED] ARIPiprazole (ABILIFY) 5 MG tablet Take 1/2 tab daily for 1 week and than full tab daily (Patient not taking: Reported on 03/22/2018) 30 tablet 0   [DISCONTINUED] sucralfate (CARAFATE) 1 g tablet Take 1 tablet (1 g total) by mouth 4 (four) times daily as needed. 30 tablet 0   No current facility-administered medications on file prior to visit.   Review of Systems: ROS negative except for what is noted on the assessment and plan.  Vitals:   07/09/22 1028  BP: 127/75  Pulse: 71  Temp: 98.3 F (36.8 C)  TempSrc: Oral  SpO2: 100%  Weight: 292 lb 4.8 oz (132.6 kg)  Height: 6' (1.829 m)   Physical Exam: Constitutional: well-appearing female, sitting in chair, in no acute distress HENT: normocephalic atraumatic, tongue pallor Eyes: conjunctival pallor Neck: supple Cardiovascular: regular rate and rhythm, no m/r/g Pulmonary/Chest: normal work of breathing on room air, lungs clear to auscultation bilaterally MSK: Lumbar: paraspinal tenderness, no erythema, lesions or deformity Neurological: alert &  oriented x 3 Skin: warm and dry Psych: normal mood and behavior  Assessment & Plan:   Migraine without status migrainosus, not intractable Patient with hx of migraines. Was following with Dr. Everlena Cooper but lost insurance in 2021. She was on topiramate 25 mg and rizatriptan 10 mg until she ran out a year ago. Endorses migraines with aura about 2 times per week that can last 24 hours. Tylenol and ibuprofen does not help. She now has insurance and would like to continue following with neurology.   Plan -refilled topiramate and rizatriptan  -provided information for Dr. Moises Blood office   [facility] DEFICIENCY Iron/TIBC/Ferritin/ %Sat    Component Value Date/Time   IRON 24 (L) 06/23/2022 1659   TIBC 456 (H) 06/23/2022 1659   FERRITIN 5 (L) 06/23/2022 1659   IRONPCTSAT 5 (LL) 06/23/2022 1659   Patient with hx of IDA. Seen in clinic on 10/25 for vaginal bleeding, iron panel and ferritin low. She was supposed to receive iron infusions but has yet to complete it. Has infusion scheduled for next week. She has not been taking the oral iron supplement. Endorses continued fatigue and low energy. Exam today showed conjunctival and tongue pallor. Advised patient to get the iron infusion and restart oral iron supplementation.   Plan -has scheduled iron infusion on 11/17 -refilled ferrous sulfate 325 mg every other day -reassess at next follow up  Lumbar back pain Patient reports hx of chronic low back pain. She feels the pain is  sharp like spasms. Does not radiate down LE. Denies numbness or weakness of LE. No other red flag symptoms. She has tried tylenol and ibuprofen with some brief improvement. Exam today showed lumbar paraspinal tenderness.  Plan -continue with tylenol and ibuprofen -sent Robaxin 1500 mg TID (30 tablets)  Homelessness Patient reports being homeless and would like to discuss about resources. She would benefit from referral to social work/care coordination.   Plan -referral to  community care coordination   Health care maintenance -she has appointment with OBGYN for pap smear  -deferred Tdap today, would consider it at next visit  -unable to collect hepatitis C for screening today, future order placed for next visit    Patient seen with Dr. Docia Furl, D.O. [facility] Internal Medicine, PGY-1 Phone: [phone] Date 07/09/2022 Time 3:39 PM

## 2022-07-09 NOTE — Assessment & Plan Note (Addendum)
Patient with hx of migraines. Was following with Dr. Everlena Cooper but lost insurance in 2021. She was on topiramate 25 mg and rizatriptan 10 mg until she ran out a year ago. Endorses migraines with aura about 2 times per week that can last 24 hours. Tylenol and ibuprofen does not help. She now has insurance and would like to continue following with neurology.   Plan -refilled topiramate and rizatriptan  -provided information for Dr. Moises Blood office

## 2022-07-09 NOTE — Patient Instructions (Addendum)
Thank you, Diane Rice for allowing [place] to provide your care today.   -Please make sure to complete your iron infusions. -Start taking your Ferrous sulfate 325 mg every other day for your iron deficiency. -Refilled Topamax and Rizatriptan for your migraines. -Sent Robaxin 1500 mg up to three times daily for your muscle spasms -You may take tylenol or aleve for your back pain.   -Referral sent today for care coordination to help you.  -Pending referral for behavioral health from last visit.   -Screening for hepatitis C today -Pap smear at your OBGYN   Info for Dr. Moises Blood office: [facility] [address], [address] [place], [place] [address] [phone]  I have ordered the following labs for you:  Lab Orders         Hepatitis C Antibody      Referrals ordered today:  Referral Orders         AMB Referral to [facility] Coordinaton      I have ordered the following medication/changed the following medications:   Stop the following medications: Medications Discontinued During This Encounter  Medication Reason   benzonatate (TESSALON) 100 MG capsule    topiramate (TOPAMAX) 25 MG tablet Reorder   rizatriptan (MAXALT) 10 MG tablet Reorder   ferrous sulfate 325 (65 FE) MG tablet Reorder     Start the following medications: Meds ordered this encounter  Medications   topiramate (TOPAMAX) 25 MG tablet    Sig: Take 1 tablet at bedtime for a week, then increase to 2 tablets at bedtime    Dispense:  60 tablet    Refill:  0   rizatriptan (MAXALT) 10 MG tablet    Sig: Take 1 tablet earliest onset of migraine.  May repeat in 2 hours if needed.  Maximum 2 tablets in 24 hours.    Dispense:  10 tablet    Refill:  5   ferrous sulfate 325 (65 FE) MG tablet    Sig: Take 1 tablet (325 mg total) by mouth every other day.    Dispense:  90 tablet    Refill:  1   methocarbamol (ROBAXIN-750) 750 MG tablet    Sig: Take 2 tablets (1,500 mg total) by mouth 3  (three) times daily.    Dispense:  30 tablet    Refill:  0     Follow up:  1 month    Remember: Please make sure to get your iron infusion.   Should you have any questions or concerns please call the internal medicine clinic at [phone].    Rana Snare, D.O. [facility]

## 2022-07-11 ENCOUNTER — Encounter: Payer: Self-pay | Admitting: Student

## 2022-07-11 DIAGNOSIS — N946 Dysmenorrhea, unspecified: Secondary | ICD-10-CM | POA: Insufficient documentation

## 2022-07-11 NOTE — Assessment & Plan Note (Signed)
History of heavy menstrual bleeding.  Notes generalized cramping over her body.  Noted to have hemoglobin 7.3.  It appears her hemoglobin is been chronically low.  Suspect this may be due to iron deficiency given her history of vaginal bleeding.  She also reports history of thalassemia and her mother which she is also concerned about.  Suspect iron deficiency may be contributing to her symptoms.  We will check iron studies today and start her on ferrous sulfate.  She may benefit from IV iron if levels are significantly low.  She will follow-up with OB/GYN to discuss her vaginal bleeding.

## 2022-07-11 NOTE — Progress Notes (Signed)
New Patient Office Visit  Subjective    Patient ID: Diane Rice, female    DOB: 12-06-1985  Age: 36 y.o. MRN: [id number]  CC: No chief complaint on file.   HPI Diane Rice is a 36 year old person with history of iron deficiency anemia, chronic migraines, PTSD, and bipolar disorder presents to establish care. Please refer to problem based charting for further details and assessment and plan of current problem and chronic medical conditions.   Outpatient Encounter Medications as of 06/23/2022  Medication Sig   acetaminophen (TYLENOL) 500 MG tablet Take 1 tablet (500 mg total) by mouth every 6 (six) hours as needed.   albuterol (VENTOLIN HFA) 108 (90 Base) MCG/ACT inhaler Inhale 1-2 puffs into the lungs every 4 (four) hours as needed for wheezing or shortness of breath.   ondansetron (ZOFRAN-ODT) 4 MG disintegrating tablet Take 1 tablet (4 mg total) by mouth every 8 (eight) hours as needed for nausea or vomiting.   [DISCONTINUED] ARIPiprazole (ABILIFY) 5 MG tablet Take 1/2 tab daily for 1 week and than full tab daily (Patient not taking: Reported on 03/22/2018)   [DISCONTINUED] benzonatate (TESSALON) 100 MG capsule Take 1 capsule (100 mg total) by mouth 3 (three) times daily as needed for cough.   [DISCONTINUED] ferrous sulfate 325 (65 FE) MG tablet Take 1 tablet (325 mg total) by mouth 3 (three) times daily with meals.   [DISCONTINUED] rizatriptan (MAXALT) 10 MG tablet Take 1 tablet earliest onset of migraine.  May repeat in 2 hours if needed.  Maximum 2 tablets in 24 hours.   [DISCONTINUED] sucralfate (CARAFATE) 1 g tablet Take 1 tablet (1 g total) by mouth 4 (four) times daily as needed.   [DISCONTINUED] topiramate (TOPAMAX) 25 MG tablet Take 1 tablet at bedtime for a week, then increase to 2 tablets at bedtime   No facility-administered encounter medications on file as of 06/23/2022.    Past Medical History:  Diagnosis Date   Anemia    Anemia    Asthma    Back pain,  chronic    Hypertension    Muscle spasm     Past Surgical History:  Procedure Laterality Date   MOLE REMOVAL     WISDOM TOOTH EXTRACTION      Family History  Problem Relation Age of Onset   Depression Mother    Bipolar disorder Sister     Social History   Socioeconomic History   Marital status: Significant Other    Spouse name: jamal   Number of children: 2   Years of education: Not on file   Highest education level: 9th grade  Occupational History   Not on file  Tobacco Use   Smoking status: Never   Smokeless tobacco: Never  Vaping Use   Vaping Use: Never used  Substance and Sexual Activity   Alcohol use: Yes    Alcohol/week: 1.0 - 2.0 standard drink of alcohol    Types: 1 - 2 Cans of beer per week    Comment: socially   Drug use: No   Sexual activity: Yes    Birth control/protection: None  Other Topics Concern   Not on file  Social History Narrative   Left Handed   One Story Home   Drinks Caffeine   Social Determinants of Health   Financial Resource Strain: High Risk (07/09/2022)   Overall Financial Resource Strain (CARDIA)    Difficulty of Paying Living Expenses: Very hard  Food Insecurity: Food Insecurity Present (07/09/2022)  Hunger Vital Sign    Worried About Running Out of Food in the Last Year: Never true    Ran Out of Food in the Last Year: Sometimes true  Transportation Needs: No Transportation Needs (07/09/2022)   PRAPARE - Administrator, Civil Service (Medical): No    Lack of Transportation (Non-Medical): No  Physical Activity: Inactive (07/09/2022)   Exercise Vital Sign    Days of Exercise per Week: 0 days    Minutes of Exercise per Session: 0 min  Stress: Stress Concern Present (07/09/2022)   [employer] of Occupational Health - Occupational Stress Questionnaire    Feeling of Stress : Rather much  Social Connections: Moderately Isolated (07/09/2022)   Social Connection and Isolation Panel [NHANES]    Frequency of  Communication with Friends and Family: Once a week    Frequency of Social Gatherings with Friends and Family: Never    Attends Religious Services: More than 4 times per year    Active Member of [employer] or Organizations: No    Attends Banker Meetings: Never    Marital Status: Living with partner  Intimate Partner Violence: Not At Risk (07/09/2022)   Humiliation, Afraid, Rape, and Kick questionnaire    Fear of Current or Ex-Partner: No    Emotionally Abused: No    Physically Abused: No    Sexually Abused: No    Review of Systems  Constitutional:  Positive for malaise/fatigue. Negative for chills and fever.  Eyes:  Negative for double vision and photophobia.  Respiratory:  Negative for cough and sputum production.   Cardiovascular:  Negative for chest pain.  Musculoskeletal:  Positive for myalgias.        Objective    BP (!) 119/58 (BP Location: Left Arm, Patient Position: Sitting, Cuff Size: Large)   Pulse 76   Ht 6' (1.829 m)   Wt 242 lb 14.4 oz (110.2 kg)   SpO2 100%   BMI 32.94 kg/m   Physical Exam Constitutional:      General: She is not in acute distress.    Appearance: She is obese.  HENT:     Mouth/Throat:     Mouth: Mucous membranes are moist.     Pharynx: Oropharynx is clear.  Eyes:     Extraocular Movements: Extraocular movements intact.     Pupils: Pupils are equal, round, and reactive to light.     Comments: Conjunctival pallor  Cardiovascular:     Rate and Rhythm: Normal rate and regular rhythm.     Heart sounds: No murmur heard. Pulmonary:     Effort: Pulmonary effort is normal.     Breath sounds: No rhonchi or rales.  Abdominal:     General: Abdomen is flat. Bowel sounds are normal. There is no distension.     Palpations: Abdomen is soft.     Tenderness: There is no abdominal tenderness.  Musculoskeletal:        General: Normal range of motion.     Right lower leg: No edema.     Left lower leg: No edema.  Skin:    General: Skin is  warm and dry.     Capillary Refill: Capillary refill takes less than 2 seconds.     Findings: No rash.  Neurological:     General: No focal deficit present.     Mental Status: She is alert and oriented to person, place, and time.  Psychiatric:        Mood and Affect: Mood  normal.        Behavior: Behavior normal.     Last CBC Lab Results  Component Value Date   WBC 7.0 06/07/2022   HGB 7.3 (L) 06/07/2022   HCT 25.7 (L) 06/07/2022   MCV 70.4 (L) 06/07/2022   MCH 20.0 (L) 06/07/2022   RDW 20.9 (H) 06/07/2022   PLT 362 06/07/2022   Last metabolic panel Lab Results  Component Value Date   GLUCOSE 114 (H) 06/07/2022   NA 137 06/07/2022   K 4.1 06/07/2022   CL 105 06/07/2022   CO2 25 06/07/2022   BUN 9 06/07/2022   CREATININE 0.77 06/07/2022   GFRNONAA >60 06/07/2022   CALCIUM 9.1 06/07/2022   PROT 7.6 06/07/2022   ALBUMIN 3.8 06/07/2022   BILITOT 0.5 06/07/2022   ALKPHOS 62 06/07/2022   AST 24 06/07/2022   ALT 21 06/07/2022   ANIONGAP 7 06/07/2022        Assessment & Plan:   Problem List Items Addressed This Visit       Other   ANEMIA-IRON DEFICIENCY - Primary   Relevant Orders   Iron, TIBC and Ferritin Panel (Completed)   Other Visit Diagnoses     Bipolar 1 disorder (HCC)       Relevant Orders   Ambulatory referral to Psychiatry       Return in about 2 weeks (around 07/07/2022).   Quincy Simmonds, MD

## 2022-07-11 NOTE — Assessment & Plan Note (Signed)
Reports history of heavy menstrual bleeding.  Also has abnormal bleeding after intercourse with her husband.  She was evaluated in the ED for this on 06/07/2022.  Noted to be anemic with hemoglobin of 7.3.  Transvaginal ultrasound performed in ED with normal uterus without endometrial thickness or fibroids.  Patient is concerned that these results are inaccurate.  She had discussed not wanting hormonal treatments with the ED. She states she is still desiring a pregnancy today in the office.  She has follow-up with OB/GYN on 11/15 regarding this.

## 2022-07-11 NOTE — Assessment & Plan Note (Signed)
Patient with generalized pain and muscular cramping.  Suspect this may be due to iron deficiency.  Plan to check iron studies today.  If pain is not improved with iron repletion will discuss this further.

## 2022-07-11 NOTE — Assessment & Plan Note (Signed)
Patient reports history of bipolar disorder and depression. Reports she has very low energy and cannot get out of bed on days. Has had difficultly maintaining her job due to this. Previously prescribed aripiprazole for this but never took this.  Referral to psychiatry. Marland Kitchen

## 2022-07-12 ENCOUNTER — Encounter (HOSPITAL_COMMUNITY): Payer: Commercial Managed Care - HMO

## 2022-07-12 ENCOUNTER — Telehealth: Payer: Self-pay | Admitting: *Deleted

## 2022-07-12 NOTE — Progress Notes (Unsigned)
  Care Coordination  Outreach Note  07/12/2022 Name: Diane Rice MRN: [id number] DOB: [birth year]   Care Coordination Outreach Attempts: An unsuccessful telephone outreach was attempted today to offer the patient information about available care coordination services as a benefit of their health plan.   Follow Up Plan:  Additional outreach attempts will be made to offer the patient care coordination information and services.   Encounter Outcome:  No Answer  Christie Nottingham  Care Coordination Care Guide  Direct Dial: [phone]

## 2022-07-14 ENCOUNTER — Telehealth: Payer: Self-pay | Admitting: *Deleted

## 2022-07-14 ENCOUNTER — Encounter: Payer: Self-pay | Admitting: Obstetrics and Gynecology

## 2022-07-14 ENCOUNTER — Ambulatory Visit: Payer: Self-pay | Admitting: Licensed Clinical Social Worker

## 2022-07-14 ENCOUNTER — Other Ambulatory Visit (HOSPITAL_COMMUNITY)
Admission: RE | Admit: 2022-07-14 | Discharge: 2022-07-14 | Disposition: A | Discharge status: Home / Self Care | Payer: Commercial Managed Care - HMO | Source: Ambulatory Visit | Attending: Obstetrics and Gynecology | Admitting: Obstetrics and Gynecology

## 2022-07-14 ENCOUNTER — Ambulatory Visit (INDEPENDENT_AMBULATORY_CARE_PROVIDER_SITE_OTHER): Payer: Commercial Managed Care - HMO | Admitting: Obstetrics and Gynecology

## 2022-07-14 VITALS — BP 128/75 | HR 75 | Ht 72.0 in | Wt 286.8 lb

## 2022-07-14 DIAGNOSIS — Z01419 Encounter for gynecological examination (general) (routine) without abnormal findings: Secondary | ICD-10-CM | POA: Insufficient documentation

## 2022-07-14 DIAGNOSIS — N939 Abnormal uterine and vaginal bleeding, unspecified: Secondary | ICD-10-CM | POA: Diagnosis not present

## 2022-07-14 NOTE — Progress Notes (Signed)
Error opened under wrong department

## 2022-07-14 NOTE — Patient Instructions (Addendum)
Tranexamic acid (TXA) - to help lighten period  Start NSAIDs prior to period and take them scheduled (twice a day for naproxen or every 8 hours for ibuprofen, do not take both)  Hysteroscopy to look inside the uterus Laparoscopy to look inside the pelvis for endometriosis

## 2022-07-14 NOTE — Progress Notes (Signed)
  Care Coordination   Note   07/14/2022 Name: [name] MRN: [id number] DOB: Oct 11, 1985  Diane Rice is a 36 y.o. year old female who sees Rana Snare, DO for primary care. I reached out to Hulan Fess by phone today to offer care coordination services.  [name] was given information about Care Coordination services today including:   The Care Coordination services include support from the care team which includes your Nurse Coordinator, Clinical Social Worker, or Pharmacist.  The Care Coordination team is here to help remove barriers to the health concerns and goals most important to you. Care Coordination services are voluntary, and the patient may decline or stop services at any time by request to their care team member.   Care Coordination Consent Status: Patient agreed to services and verbal consent obtained.   Follow up plan:  Telephone appointment with care coordination team member scheduled for:  07/15/22  Encounter Outcome:  Pt. Scheduled  [facility] Coordination Care Guide  Direct Dial: [phone]

## 2022-07-14 NOTE — Patient Outreach (Signed)
  Care Coordination   07/14/2022 Name: Diane Rice MRN: [id number] DOB: 1986/08/29   Care Coordination Outreach Attempts:  An unsuccessful telephone outreach was attempted today to offer the patient information about available care coordination services as a benefit of their health plan.   Follow Up Plan:  Additional outreach attempts will be made to offer the patient care coordination information and services.   Encounter Outcome:  No Answer  Care Coordination Interventions Activated:  No   Care Coordination Interventions:  No, not indicated    Rande Lawman, LCSW-A  Social Worker [facility] Care Management  [phone]

## 2022-07-14 NOTE — Progress Notes (Signed)
NEW GYNECOLOGY PATIENT Patient name: Diane Rice MRN [id number]  Date of birth: 04/13/1986 Chief Complaint:   Vaginal Bleeding     History:  Diane Rice is a 36 y.o. being seen today for well woman and AUB.    In the ED, presented with bleeding x7 days after her usual bleeding. Typically using super tampons and will wear a pad as back up, menses lasting 4 days and once a month. A few times has had a second week of bleeding after only being off her period for about a week. In the past has tried midol, ibuporfen, bayer - no prior rx for menses. No prior + 2 pregnancy, NSVDs uncomplicated  With the '2nd' period also had seignificant pelvic pain with back pain, more than/in addition to her usual 'extreme period pand'.  Stool with blood 1-2 times when constipated  Sexually active - occasional pain with intercourse. Pain with deep penetration that does not continue afterwards. Bleeding after intercourse.   Dental surgeries and perineal repair after daughter, no prior abdominal surgeries. Plan on having more children in the future.  HTN, migraines without aura, no liver problems, no prior VTE, no persona hx of breast cancer, no hx of MI or stroke and no tobacco use  Having some issues with food acquisition    13 years of trying to conceive - same partner  Actively trying to conceive at this time. Has been having moliminal symptos  Partner has not had any changes in health, 37 years old, no tobacoo use, no. Both have had covid  No pregnancies or miscarriages since last delivery 7 years after initial pregnancy without protection before having second child   Vaginal Bleeding This is a chronic problem. She is sexually active.     Review of Systems  Genitourinary:  Positive for vaginal bleeding.        Gynecologic History Patient's last menstrual period was 07/05/2022 (approximate). Contraception: none Last Pap: unk. Last Mammogram: n/a.   Last Colonoscopy: n/a.     Obstetric History OB History  No obstetric history on file.    Past Medical History:  Diagnosis Date   Anemia    Anemia    Asthma    Back pain, chronic    Hypertension    Muscle spasm     Past Surgical History:  Procedure Laterality Date   MOLE REMOVAL     WISDOM TOOTH EXTRACTION      Current Outpatient Medications on File Prior to Visit  Medication Sig Dispense Refill   acetaminophen (TYLENOL) 500 MG tablet Take 1 tablet (500 mg total) by mouth every 6 (six) hours as needed. 30 tablet 0   albuterol (VENTOLIN HFA) 108 (90 Base) MCG/ACT inhaler Inhale 1-2 puffs into the lungs every 4 (four) hours as needed for wheezing or shortness of breath. 1 each 0   ferrous sulfate 325 (65 FE) MG tablet Take 1 tablet (325 mg total) by mouth every other day. 90 tablet 1   methocarbamol (ROBAXIN-750) 750 MG tablet Take 2 tablets (1,500 mg total) by mouth 3 (three) times daily. 30 tablet 0   rizatriptan (MAXALT) 10 MG tablet Take 1 tablet earliest onset of migraine.  May repeat in 2 hours if needed.  Maximum 2 tablets in 24 hours. 10 tablet 5   topiramate (TOPAMAX) 25 MG tablet Take 1 tablet at bedtime for a week, then increase to 2 tablets at bedtime 60 tablet 0   ondansetron (ZOFRAN-ODT) 4 MG disintegrating tablet Take 1 tablet (4  mg total) by mouth every 8 (eight) hours as needed for nausea or vomiting. (Patient not taking: Reported on 07/14/2022) 20 tablet 0   [DISCONTINUED] ARIPiprazole (ABILIFY) 5 MG tablet Take 1/2 tab daily for 1 week and than full tab daily (Patient not taking: Reported on 03/22/2018) 30 tablet 0   [DISCONTINUED] sucralfate (CARAFATE) 1 g tablet Take 1 tablet (1 g total) by mouth 4 (four) times daily as needed. 30 tablet 0   No current facility-administered medications on file prior to visit.    Allergies  Allergen Reactions   Hydrocodone-Acetaminophen Itching and Other (See Comments)    hallucinations    Social History:  reports that she quit smoking about 13  years ago. Her smoking use included cigarettes. She has a 6.50 pack-year smoking history. She has never used smokeless tobacco. She reports current alcohol use of about 1.0 - 2.0 standard drink of alcohol per week. She reports that she does not use drugs.  Family History  Problem Relation Age of Onset   Depression Mother    Bipolar disorder Sister     The following portions of the patient's history were reviewed and updated as appropriate: allergies, current medications, past family history, past medical history, past social history, past surgical history and problem list.  Review of Systems Pertinent items noted in HPI and remainder of comprehensive ROS otherwise negative.  Physical Exam:  BP 128/75   Pulse 75   Ht 6' (1.829 m)   Wt 286 lb 12.8 oz (130.1 kg)   LMP 07/05/2022 (Approximate)   BMI 38.90 kg/m  Physical Exam Vitals and nursing note reviewed. Exam conducted with a chaperone present.  Constitutional:      Appearance: Normal appearance.  Cardiovascular:     Rate and Rhythm: Normal rate.  Pulmonary:     Effort: Pulmonary effort is normal.     Breath sounds: Normal breath sounds.  Genitourinary:    General: Normal vulva.     Exam position: Lithotomy position.     Vagina: Normal.     Cervix: Normal.  Neurological:     General: No focal deficit present.     Mental Status: She is alert and oriented to person, place, and time.  Psychiatric:        Mood and Affect: Mood normal.        Behavior: Behavior normal.        Thought Content: Thought content normal.        Judgment: Judgment normal.      Assessment and Plan:   1. Well woman exam with routine gynecological exam Routine pap collected today  - Cytology - PAP( [facility])  2. Abnormal uterine bleeding (AUB) Discussed management options for abnormal uterine bleeding including NSAIDs (Naproxen), tranexamic acid (Lysteda), oral progesterone, Depo Provera, Levonogestrel IUD, endometrial ablation or  hysterectomy as definitive surgical management.  Discussed risks and benefits of each method.   Patient desires TXA.   Lysteda prescribed for now,  bleeding precautions reviewed.   Reviewed that TVUS may have endometrial polyp due to increased vascularity and thickness. Also has history of infertility, recommend hysteroscopy with D&C for endometrial cavity evaluation and sampling. Due to longstanding, significant dysmenorrhea, will proceed with dx lsc to assess for endometriosis. Couple would prefer view, ok with biopsy but does not want any resection that could interfere with her getting pregnant (I.e. salpingectomy, oophorectomy, etc)>   - tranexamic acid (LYSTEDA) 650 MG TABS tablet; Take 2 tablets (1,300 mg total) by mouth 3 (three)  times daily. Take during menses for a maximum of five days  Dispense: 30 tablet; Refill: 6  Patient desires surgical management with HSC and dx lsc with chromotubation.  The risks of surgery were discussed in detail with the patient including but not limited to: bleeding which may require transfusion or reoperation; infection which may require prolonged hospitalization or re-hospitalization and antibiotic therapy; injury to bowel, bladder, ureters and major vessels or other surrounding organs which may lead to other procedures; formation of adhesions; need for additional procedures including laparotomy or subsequent procedures secondary to intraoperative injury or abnormal pathology; thromboembolic phenomenon; incisional problems and other postoperative or anesthesia complications.  Patient was told that the likelihood that her condition and symptoms will be treated effectively with this surgical management was very high; the postoperative expectations were also discussed in detail. The patient also understands the alternative treatment options which were discussed in full. All questions were answered.  She was told that she will be contacted by our surgical scheduler regarding  the time and date of her surgery; routine preoperative instructions will be given to her by the preoperative nursing team.  Printed patient education handouts about the procedure were given to the patient to review at home.   Routine preventative health maintenance measures emphasized. Please refer to After Visit Summary for other counseling recommendations.      [name], MD Obstetrician & Gynecologist, Faculty Practice [facility] for [employer], [facility]

## 2022-07-15 ENCOUNTER — Encounter: Payer: Commercial Managed Care - HMO | Admitting: Licensed Clinical Social Worker

## 2022-07-15 ENCOUNTER — Encounter: Payer: Self-pay | Admitting: Obstetrics and Gynecology

## 2022-07-15 MED ORDER — TRANEXAMIC ACID 650 MG PO TABS: 1300.0000 mg | ORAL_TABLET | Freq: Three times a day (TID) | ORAL | 6 refills | Status: DC

## 2022-07-16 ENCOUNTER — Encounter (HOSPITAL_COMMUNITY)
Admission: RE | Admit: 2022-07-16 | Discharge: 2022-07-16 | Disposition: A | Discharge status: Home / Self Care | Payer: Commercial Managed Care - HMO | Source: Ambulatory Visit | Attending: Internal Medicine | Admitting: Internal Medicine

## 2022-07-16 DIAGNOSIS — D500 Iron deficiency anemia secondary to blood loss (chronic): Secondary | ICD-10-CM | POA: Diagnosis not present

## 2022-07-16 LAB — CYTOLOGY - PAP
Comment: NEGATIVE
Comment: NEGATIVE
Comment: NEGATIVE
Diagnosis: HIGH — AB
HPV 16: POSITIVE — AB
HPV 18 / 45: NEGATIVE
High risk HPV: POSITIVE — AB

## 2022-07-16 MED ORDER — SODIUM CHLORIDE 0.9 % IV SOLN
510.0000 mg | INTRAVENOUS | Status: DC
  Administered 2022-07-16: 510 mg via INTRAVENOUS
  Filled 2022-07-16: qty 510

## 2022-07-18 ENCOUNTER — Encounter: Payer: Self-pay | Admitting: Obstetrics and Gynecology

## 2022-07-19 ENCOUNTER — Telehealth: Payer: Self-pay | Admitting: Obstetrics and Gynecology

## 2022-07-19 NOTE — Telephone Encounter (Signed)
Called, no answer, left voicemail and sent MyChart message.  

## 2022-07-19 NOTE — Progress Notes (Signed)
Internal Medicine Clinic Attending  I saw and evaluated the patient.  I personally confirmed the key portions of the history and exam documented by the resident  and I reviewed pertinent patient test results.  The assessment, diagnosis, and plan were formulated together and I agree with the documentation in the resident's note.  

## 2022-07-19 NOTE — Progress Notes (Signed)
Internal Medicine Clinic Attending ? ?Case discussed with Dr. Liang  At the time of the visit.  We reviewed the resident?s history and exam and pertinent patient test results.  I agree with the assessment, diagnosis, and plan of care documented in the resident?s note. ? ?

## 2022-07-20 ENCOUNTER — Telehealth: Payer: Self-pay

## 2022-07-20 NOTE — Telephone Encounter (Addendum)
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   Called pt; VM left stating I am calling with results that are also available in MyChart. Callback number given. Will contact patient a second time.

## 2022-07-21 ENCOUNTER — Ambulatory Visit: Payer: Self-pay | Admitting: Licensed Clinical Social Worker

## 2022-07-21 ENCOUNTER — Telehealth: Payer: Self-pay | Admitting: Obstetrics and Gynecology

## 2022-07-21 NOTE — Telephone Encounter (Signed)
Called and spoke with patient. 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 Explained LEEP Procedure. Patient with multiple questions and would like for for Dr. Briscoe Deutscher to call her to explain a little further. Patient is concerned this will effect fertility and wants to know if she will get it again from her husband after treatment.   Will route to Dr. Briscoe Deutscher for advisement and ask her to call the patient.

## 2022-07-21 NOTE — Patient Outreach (Signed)
  Care Coordination   07/21/2022 Name: Diane Rice MRN: [id number] DOB: 02-05-1986   Care Coordination Outreach Attempts:  An unsuccessful telephone outreach was attempted today to offer the patient information about available care coordination services as a benefit of their health plan.   Follow Up Plan:  Additional outreach attempts will be made to offer the patient care coordination information and services.   Encounter Outcome:  No Answer  Care Coordination Interventions Activated:  Yes   Care Coordination Interventions:  No, not indicated    Christen Butter, BSW  Social Worker [facility]  [phone]

## 2022-07-21 NOTE — Telephone Encounter (Signed)
Called patient regarding results of pap smear and recommendation for LEEP prior to undergoing hysteroscopy and laparoscopy. No answer, left voicemail.

## 2022-07-23 ENCOUNTER — Encounter (HOSPITAL_COMMUNITY)
Admission: RE | Admit: 2022-07-23 | Discharge: 2022-07-23 | Disposition: A | Discharge status: Home / Self Care | Payer: Commercial Managed Care - HMO | Source: Ambulatory Visit | Attending: Internal Medicine | Admitting: Internal Medicine

## 2022-07-23 DIAGNOSIS — D500 Iron deficiency anemia secondary to blood loss (chronic): Secondary | ICD-10-CM

## 2022-07-23 MED ORDER — SODIUM CHLORIDE 0.9 % IV SOLN
510.0000 mg | INTRAVENOUS | Status: DC
  Administered 2022-07-23: 510 mg via INTRAVENOUS
  Filled 2022-07-23: qty 17

## 2022-07-26 ENCOUNTER — Telehealth: Payer: Self-pay | Admitting: *Deleted

## 2022-07-26 ENCOUNTER — Ambulatory Visit: Payer: Commercial Managed Care - HMO | Admitting: Nurse Practitioner

## 2022-07-26 NOTE — Progress Notes (Signed)
  Care Coordination Note  07/26/2022 Name: Lillianah Swartzentruber MRN: [id number] DOB: June 26, 1986  Cameren Odwyer is a 36 y.o. year old female who is a primary care patient of Rana Snare, DO and is actively engaged with the care management team. I reached out to Hulan Fess by phone today to assist with re-scheduling an initial visit with the BSW  Follow up plan: Unsuccessful telephone outreach attempt made. A HIPAA compliant phone message was left for the patient providing contact information and requesting a return call.   [facility]  Care Coordination Care Guide  Direct Dial: [phone]

## 2022-07-27 ENCOUNTER — Other Ambulatory Visit: Payer: Self-pay | Admitting: Student

## 2022-07-27 ENCOUNTER — Telehealth: Payer: Self-pay | Admitting: *Deleted

## 2022-07-27 DIAGNOSIS — M545 Low back pain, unspecified: Secondary | ICD-10-CM

## 2022-07-27 NOTE — Progress Notes (Signed)
  Care Coordination Note  07/27/2022 Name: Diane Rice MRN: [id number] DOB: [birth year]  Diane Rice is a 36 y.o. year old female who is a primary care patient of Rana Snare, DO and is actively engaged with the care management team. I reached out to Hulan Fess by phone today to assist with re-scheduling an initial visit with the BSW  Follow up plan: Unsuccessful telephone outreach attempt made. A HIPAA compliant phone message was left for the patient providing contact information and requesting a return call.  We have been unable to make contact with the patient for follow up. The care management team is available to follow up with the patient after provider conversation with the patient regarding recommendation for care management engagement and subsequent re-referral to the care management team.   [facility] Coordination Care Guide  Direct Dial: [phone]

## 2022-07-27 NOTE — Progress Notes (Signed)
  Care Coordination Note  07/27/2022 Name: Diane Rice MRN: [id number] DOB: Jun 16, 1986  Diane Rice is a 36 y.o. year old female who is a primary care patient of Rana Snare, DO and is actively engaged with the care management team. I reached out to Hulan Fess by phone today to assist with re-scheduling an initial visit with the BSW  Follow up plan: Telephone appointment with care management team member scheduled for:07/30/22  [facility] Coordination Care Guide  Direct Dial: [phone]

## 2022-07-29 ENCOUNTER — Encounter: Payer: Self-pay | Admitting: Lactation Services

## 2022-07-30 ENCOUNTER — Telehealth (INDEPENDENT_AMBULATORY_CARE_PROVIDER_SITE_OTHER): Payer: Commercial Managed Care - HMO | Admitting: Obstetrics and Gynecology

## 2022-07-30 ENCOUNTER — Ambulatory Visit: Payer: Self-pay | Admitting: Licensed Clinical Social Worker

## 2022-07-30 ENCOUNTER — Encounter: Payer: Self-pay | Admitting: Obstetrics and Gynecology

## 2022-07-30 DIAGNOSIS — R87810 Cervical high risk human papillomavirus (HPV) DNA test positive: Secondary | ICD-10-CM | POA: Diagnosis not present

## 2022-07-30 NOTE — Telephone Encounter (Signed)
Called patient and confirmed I.D. x2. Pt had husband on phone with her per her request. 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 Recommended excisional treatment via LEEP. Pt and husband had questions regarding diagnosis, treatment and fertility implications - all questions answered. Pt and husband highly desire pregnancy. Pt has a history of AUB and dysmenorrhea with thickened endometrial stripe with increased vascularity.   Pt and husband requested additional information be sent via MyChart. They are not ready to book procedure and would like to confer with others that they trust and will reach out to schedule if/when ready.   Will need to cancel hysteroscopy and laparoscopy until cervical abnormalities are addressed.    Information sent to patient:   [email or web address]  [email or web address]  [email or web address]   [email or web address]  [email or web address]   [name], MD 5:13 PM 07/30/22

## 2022-07-30 NOTE — Patient Outreach (Signed)
  Care Coordination   07/30/2022 Name: Diane Rice MRN: [id number] DOB: [birth year]   Care Coordination Outreach Attempts:  A third unsuccessful outreach was attempted today to offer the patient with information about available care coordination services as a benefit of their health plan.   Follow Up Plan:  No further outreach attempts will be made at this time. We have been unable to contact the patient to offer or enroll patient in care coordination services  Encounter Outcome:  No Answer   Care Coordination Interventions:  No, not indicated    Ander Gaster ,MSW,LCSW-A Social Worker [facility]  [phone]

## 2022-08-03 MED ORDER — METHOCARBAMOL 750 MG PO TABS: 1500.0000 mg | ORAL_TABLET | Freq: Three times a day (TID) | ORAL | 0 refills | Status: DC

## 2022-08-05 ENCOUNTER — Other Ambulatory Visit: Payer: Self-pay | Admitting: Student

## 2022-08-05 DIAGNOSIS — G43909 Migraine, unspecified, not intractable, without status migrainosus: Secondary | ICD-10-CM

## 2022-08-10 ENCOUNTER — Other Ambulatory Visit: Payer: Self-pay | Admitting: Student

## 2022-08-10 DIAGNOSIS — G43909 Migraine, unspecified, not intractable, without status migrainosus: Secondary | ICD-10-CM

## 2022-09-03 ENCOUNTER — Encounter: Payer: Self-pay | Admitting: Obstetrics and Gynecology

## 2022-09-03 ENCOUNTER — Telehealth: Payer: Commercial Managed Care - HMO | Admitting: Family Medicine

## 2022-09-03 DIAGNOSIS — R300 Dysuria: Secondary | ICD-10-CM

## 2022-09-03 NOTE — Progress Notes (Signed)
[place]

## 2022-09-06 ENCOUNTER — Ambulatory Visit (INDEPENDENT_AMBULATORY_CARE_PROVIDER_SITE_OTHER): Payer: Commercial Managed Care - HMO | Admitting: Obstetrics and Gynecology

## 2022-09-06 ENCOUNTER — Other Ambulatory Visit (HOSPITAL_COMMUNITY)
Admission: RE | Admit: 2022-09-06 | Discharge: 2022-09-06 | Disposition: A | Discharge status: Home / Self Care | Payer: Commercial Managed Care - HMO | Source: Ambulatory Visit | Attending: Obstetrics and Gynecology | Admitting: Obstetrics and Gynecology

## 2022-09-06 ENCOUNTER — Encounter: Payer: Self-pay | Admitting: Obstetrics and Gynecology

## 2022-09-06 VITALS — BP 139/87 | HR 68

## 2022-09-06 DIAGNOSIS — Z1330 Encounter for screening examination for mental health and behavioral disorders, unspecified: Secondary | ICD-10-CM | POA: Diagnosis not present

## 2022-09-06 DIAGNOSIS — R102 Pelvic and perineal pain: Secondary | ICD-10-CM | POA: Diagnosis not present

## 2022-09-06 DIAGNOSIS — R87810 Cervical high risk human papillomavirus (HPV) DNA test positive: Secondary | ICD-10-CM

## 2022-09-06 DIAGNOSIS — M62838 Other muscle spasm: Secondary | ICD-10-CM

## 2022-09-06 LAB — POCT URINALYSIS DIP (DEVICE)
Bilirubin Urine: NEGATIVE
Glucose, UA: NEGATIVE mg/dL
Hgb urine dipstick: NEGATIVE
Ketones, ur: NEGATIVE mg/dL
Leukocytes,Ua: NEGATIVE
Nitrite: NEGATIVE
Protein, ur: NEGATIVE mg/dL
Specific Gravity, Urine: >=1.03 (ref 1.005–1.030)
Urobilinogen, UA: 0.2 mg/dL (ref 0.0–1.0)
pH: 6 (ref 5.0–8.0)

## 2022-09-06 MED ORDER — CYCLOBENZAPRINE HCL 10 MG PO TABS: 10.0000 mg | ORAL_TABLET | Freq: Two times a day (BID) | ORAL | 1 refills | Status: DC | PRN

## 2022-09-06 NOTE — Patient Instructions (Addendum)
Heat and topical   You can take tylenol with ibuprofen and flexeril (cyclobenzaprine) tonight, together. Do not take the robaxin and flexeril because they are both muscle relaxers.   I advise taking the muscle relaxer before you go to bed or after physical therapy.   I have placed a referral to pelvic physical therapy.   We collected your urine to check for infection as well as vaginal swabs.

## 2022-09-06 NOTE — Progress Notes (Unsigned)
    GYNECOLOGY VISIT  Patient name: Diane Rice MRN [id number]  Date of birth: 01-23-1986 Chief Complaint:   Pelvic Pain  History:  Diane Rice is a 37 y.o. G2P2000 being seen today for sensation of something "pouring out" of her vagina but no bleeding or discharge.    Past Medical History:  Diagnosis Date   Anemia    Anemia    Asthma    Back pain, chronic    Hypertension    Muscle spasm     Past Surgical History:  Procedure Laterality Date   MOLE REMOVAL     WISDOM TOOTH EXTRACTION      The following portions of the patient's history were reviewed and updated as appropriate: allergies, current medications, past family history, past medical history, past social history, past surgical history and problem list.   Health Maintenance:   Last pap ***. Results were: {Pap findings:25134}. H/O abnormal pap: {yes/yes***/no:23866} Last mammogram: ***. Results were: {normal, abnormal, n/a:23837}. Family h/o breast cancer: {yes***/no:23838}   Review of Systems:  {Ros - complete:30496} Comprehensive review of systems was otherwise negative.   Objective:  Physical Exam BP 139/87   Pulse 68    Physical Exam Genitourinary:    Comments: Kegel 3/5     Labs and Imaging No results found.     Assessment & Plan:   There are no diagnoses linked to this encounter.   *** Routine preventative health maintenance measures emphasized.  Darliss Cheney, MD [facility] for [place]

## 2022-09-07 ENCOUNTER — Other Ambulatory Visit: Payer: Self-pay | Admitting: Obstetrics and Gynecology

## 2022-09-07 DIAGNOSIS — N760 Acute vaginitis: Secondary | ICD-10-CM

## 2022-09-07 LAB — CERVICOVAGINAL ANCILLARY ONLY
Bacterial Vaginitis (gardnerella): POSITIVE — AB
Candida Glabrata: NEGATIVE
Candida Vaginitis: NEGATIVE
Comment: NEGATIVE
Comment: NEGATIVE
Comment: NEGATIVE

## 2022-09-07 MED ORDER — METRONIDAZOLE 500 MG PO TABS: 500.0000 mg | ORAL_TABLET | Freq: Two times a day (BID) | ORAL | 0 refills | Status: AC

## 2022-09-08 LAB — URINE CULTURE: Organism ID, Bacteria: NO GROWTH

## 2022-09-13 NOTE — Progress Notes (Deleted)
NEUROLOGY FOLLOW UP OFFICE NOTE  Diane Rice [id number]  Assessment/Plan:   ***  Subjective:  Diane Rice is a 37 year old left-handed female who follows up for migraine.   UPDATE: Last seen in initial consultation on ***.  At that time ***.  She was prescribed topiramate and rizatriptan.  ***  Onset:  *** Location:  *** Quality:  *** Intensity:  ***.  *** denies new headache, thunderclap headache or severe headache that wakes *** from sleep. Aura:  *** Prodrome:  *** Postdrome:  *** Associated symptoms:  ***.  *** denies associated unilateral numbness or weakness. Duration:  *** Frequency:  *** Frequency of abortive medication: *** Triggers:  *** Relieving factors:  *** Activity:  ***  Current NSAIDS/analgesics:  *** Current triptans:  rizatriptan 35m Current ergotamine:  *** Current anti-emetic:  Zofran-ODT 463mCurrent muscle relaxants:  Robaxin 150043mID, Flexeril 34m12mD PRN Current Antihypertensive medications:  none Current Antidepressant medications:  none Current Anticonvulsant medications:  topiramate 50mg60m Current anti-CGRP:  none Current Antihistamines/Decongestants:  none Other therapy:  *** Birth control:  none   Caffeine:  *** Alcohol:  *** Smoker:  *** Diet:  *** Exercise:  *** Depression:  ***; Anxiety:  *** Other pain:  *** Sleep hygiene:  *** Family history of headache:  ***  HISTORY: Symptoms started in her early 30s. 59s started as seeing a dark spot and blurred vision.  In 2019, she reported various symptoms including back pain, bilateral leg weakness, right shoulder weakness, blurred vision, and occasional bowel and bladder weakness with urge incontinence.  To evaluate for demyelinating disease, MRI of brain and cervical spine with and without contrast was performed on 03/22/2018, which were normal. Since then, visual symptoms have progressed.  She sees multiple black spots, lines of circles, different colors, double  vision, sometimes she sees an object trailing with multiple images, blurred white spots, tunnel vision.  Symptoms are persistent.  It affects her ability to see.  She does not drive.  Around the same time, she started having significant migraines, severe right sided pounding headache associated with photophobia and phonophobia but not nausea and vomiting.  It lasts from hours to 2 days and occur every 2 1/2 to 3 weeks.  Unknown triggers.  She usually takes Tylenol Extra-strength or ibuprofen.  She saw ophthalmology in May who did not identify any abnormalities on exam.  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    Past NSAIDS/analgesics:  Tylenol, ASA-caffeine Past abortive triptans:  none Past abortive ergotamine:  none Past muscle relaxants:  none Past anti-emetic:  none Past antihypertensive medications:  none Past antidepressant medications:  none Past anticonvulsant medications:  none Past anti-CGRP:  none Other past therapies:  ***  PAST MEDICAL HISTORY: Past Medical History:  Diagnosis Date   Anemia    Anemia    Asthma    Back pain, chronic    Hypertension    Muscle spasm     MEDICATIONS: Current Outpatient Medications on File Prior to Visit  Medication Sig Dispense Refill   acetaminophen (TYLENOL) 500 MG tablet Take 1 tablet (500 mg total) by mouth every 6 (six) hours as needed. (Patient not taking: Reported on 09/06/2022) 30 tablet 0   albuterol (VENTOLIN HFA) 108 (90 Base) MCG/ACT inhaler Inhale 1-2 puffs into the lungs every 4 (four) hours as needed for wheezing or shortness of breath. (Patient not taking: Reported on 09/06/2022) 1 each 0   cyclobenzaprine (FLEXERIL) 10 MG tablet Take 1 tablet (10 mg total)  by mouth 2 (two) times daily as needed for muscle spasms. 60 tablet 1   ferrous sulfate 325 (65 FE) MG tablet Take 1 tablet (325 mg total) by mouth every other day. 90 tablet 1   methocarbamol (ROBAXIN-750) 750 MG tablet Take 2 tablets (1,500 mg total) by mouth 3 (three) times daily. 30  tablet 0   metroNIDAZOLE (FLAGYL) 500 MG tablet Take 1 tablet (500 mg total) by mouth 2 (two) times daily for 7 days. 14 tablet 0   ondansetron (ZOFRAN-ODT) 4 MG disintegrating tablet Take 1 tablet (4 mg total) by mouth every 8 (eight) hours as needed for nausea or vomiting. (Patient not taking: Reported on 07/14/2022) 20 tablet 0   rizatriptan (MAXALT) 10 MG tablet Take 1 tablet earliest onset of migraine.  May repeat in 2 hours if needed.  Maximum 2 tablets in 24 hours. 10 tablet 5   topiramate (TOPAMAX) 25 MG tablet Take 1 tablet at bedtime for a week, then increase to 2 tablets at bedtime 60 tablet 0   tranexamic acid (LYSTEDA) 650 MG TABS tablet Take 2 tablets (1,300 mg total) by mouth 3 (three) times daily. Take during menses for a maximum of five days 30 tablet 6   [DISCONTINUED] ARIPiprazole (ABILIFY) 5 MG tablet Take 1/2 tab daily for 1 week and than full tab daily (Patient not taking: Reported on 03/22/2018) 30 tablet 0   [DISCONTINUED] sucralfate (CARAFATE) 1 g tablet Take 1 tablet (1 g total) by mouth 4 (four) times daily as needed. 30 tablet 0   No current facility-administered medications on file prior to visit.    ALLERGIES: Allergies  Allergen Reactions   Hydrocodone-Acetaminophen Itching and Other (See Comments)    hallucinations    FAMILY HISTORY: Family History  Problem Relation Age of Onset   Depression Mother    Bipolar disorder Sister       Objective:  *** General: No acute distress.  Patient appears ***-groomed.   Head:  Normocephalic/atraumatic Eyes:  Fundi examined but not visualized Neck: supple, no paraspinal tenderness, full range of motion Heart:  Regular rate and rhythm Lungs:  Clear to auscultation bilaterally Back: No paraspinal tenderness Neurological Exam: alert and oriented to person, place, and time.  Speech fluent and not dysarthric, language intact.  CN II-XII intact. Bulk and tone normal, muscle strength 5/5 throughout.  Sensation to light  touch intact.  Deep tendon reflexes 2+ throughout, toes downgoing.  Finger to nose testing intact.  Gait normal, Romberg negative.   Metta Clines, DO  CC: ***

## 2022-09-14 ENCOUNTER — Ambulatory Visit: Payer: Commercial Managed Care - HMO | Admitting: Neurology

## 2022-09-14 ENCOUNTER — Encounter: Payer: Self-pay | Admitting: Neurology

## 2022-09-21 ENCOUNTER — Ambulatory Visit: Payer: Medicaid Other | Admitting: Nurse Practitioner

## 2022-09-27 ENCOUNTER — Ambulatory Visit: Payer: Commercial Managed Care - HMO | Admitting: Obstetrics and Gynecology

## 2022-09-27 NOTE — Therapy (Deleted)
OUTPATIENT PHYSICAL THERAPY FEMALE PELVIC EVALUATION   Patient Name: Diane Rice MRN: [id number] DOB:February 03, 1986, 37 y.o., female Today's Date: 09/27/2022  END OF SESSION:   Past Medical History:  Diagnosis Date   Anemia    Anemia    Asthma    Back pain, chronic    Hypertension    Muscle spasm    Past Surgical History:  Procedure Laterality Date   MOLE REMOVAL     WISDOM TOOTH EXTRACTION     Patient Active Problem List   Diagnosis Date Noted   Dysmenorrhea 07/11/2022   Migraine without status migrainosus, not intractable 07/09/2022   Homelessness 07/09/2022   Health care maintenance 07/09/2022   PTSD (post-traumatic stress disorder) 07/06/2022   OBESITY 03/19/2010   Lumbar back pain 03/19/2010   VITAMIN D DEFICIENCY 01/01/2010   ANEMIA-IRON DEFICIENCY 12/30/2009   Mood disorder ([place]) 12/30/2009   Chronic pain syndrome 12/30/2009   ASTHMA 12/30/2009    PCP:   Angelique Blonder, DO    REFERRING PROVIDER: Darliss Cheney, MD   REFERRING DIAG:  [phone] (ICD-10-CM) - Muscle spasm  R10.2 (ICD-10-CM) - Pelvic pain    THERAPY DIAG:  No diagnosis found.  Rationale for Evaluation and Treatment: Rehabilitation  ONSET DATE: ***  SUBJECTIVE:                                                                                                                                                                                           SUBJECTIVE STATEMENT: *** Fluid intake: {Yes/No:304960894}   PAIN:  Are you having pain? {yes/no:20286} NPRS scale: ***/10 Pain location: {pelvic pain location:27098}  Pain type: {type:313116} Pain description: {PAIN DESCRIPTION:21022940}   Aggravating factors: *** Relieving factors: ***  PRECAUTIONS: {Therapy precautions:24002}  WEIGHT BEARING RESTRICTIONS: {Yes ***/No:24003}  FALLS:  Has patient fallen in last 6 months? {fallsyesno:27318}  LIVING ENVIRONMENT: Lives with: {OPRC lives with:25569::"lives with their  family"} Lives in: {Lives in:25570} Stairs: {opstairs:27293} Has following equipment at home: {Assistive devices:23999}  OCCUPATION: ***  PLOF: {PLOF:24004}  PATIENT GOALS: ***  PERTINENT HISTORY:  *** Sexual abuse: {Yes/No:304960894}  BOWEL MOVEMENT: Pain with bowel movement: {yes/no:20286} Type of bowel movement:{PT BM type:27100} Fully empty rectum: {Yes/No:304960894} Leakage: {Yes/No:304960894} Pads: {Yes/No:304960894} Fiber supplement: {Yes/No:304960894}  URINATION: Pain with urination: {yes/no:20286} Fully empty bladder: {Yes/No:304960894} Stream: {PT urination:27102} Urgency: {Yes/No:304960894} Frequency: *** Leakage: {PT leakage:27103} Pads: {Yes/No:304960894}  INTERCOURSE: Pain with intercourse: {pain with intercourse PA:27099} Ability to have vaginal penetration:  {Yes/No:304960894} Climax: *** Marinoff Scale: ***/3  PREGNANCY: Vaginal deliveries *** Tearing {Yes***/No:304960894} C-section deliveries *** Currently pregnant {Yes***/No:304960894}  PROLAPSE: {PT prolapse:27101}   OBJECTIVE:   DIAGNOSTIC FINDINGS:  ***  PATIENT  SURVEYS:  {rehab surveys:24030}  PFIQ-7 ***  COGNITION: Overall cognitive status: {cognition:24006}     SENSATION: Light touch: {intact/deficits:24005} Proprioception: {intact/deficits:24005}  MUSCLE LENGTH: Hamstrings: Right *** deg; Left *** deg Thomas test: Right *** deg; Left *** deg  LUMBAR SPECIAL TESTS:  {lumbar special test:25242}  FUNCTIONAL TESTS:  {Functional tests:24029}  GAIT: Distance walked: *** Assistive device utilized: {Assistive devices:23999} Level of assistance: {Levels of assistance:24026} Comments: ***  POSTURE: {posture:25561}  PELVIC ALIGNMENT:  LUMBARAROM/PROM:  A/PROM A/PROM  eval  Flexion   Extension   Right lateral flexion   Left lateral flexion   Right rotation   Left rotation    (Blank rows = not tested)  LOWER EXTREMITY ROM:  {AROM/PROM:27142} ROM  Right eval Left eval  Hip flexion    Hip extension    Hip abduction    Hip adduction    Hip internal rotation    Hip external rotation    Knee flexion    Knee extension    Ankle dorsiflexion    Ankle plantarflexion    Ankle inversion    Ankle eversion     (Blank rows = not tested)  LOWER EXTREMITY MMT:  MMT Right eval Left eval  Hip flexion    Hip extension    Hip abduction    Hip adduction    Hip internal rotation    Hip external rotation    Knee flexion    Knee extension    Ankle dorsiflexion    Ankle plantarflexion    Ankle inversion    Ankle eversion     PALPATION:   General  ***                External Perineal Exam ***                             Internal Pelvic Floor ***  Patient confirms identification and approves PT to assess internal pelvic floor and treatment {yes/no:20286}  PELVIC MMT:   MMT eval  Vaginal   Internal Anal Sphincter   External Anal Sphincter   Puborectalis   Diastasis Recti   (Blank rows = not tested)        TONE: ***  PROLAPSE: ***  TODAY'S TREATMENT:                                                                                                                              DATE: ***  EVAL ***   PATIENT EDUCATION:  Education details: *** Person educated: {Person educated:25204} Education method: {Education Method:25205} Education comprehension: {Education Comprehension:25206}  HOME EXERCISE PROGRAM: ***  ASSESSMENT:  CLINICAL IMPRESSION: Patient is a *** y.o. *** who was seen today for physical therapy evaluation and treatment for ***.   OBJECTIVE IMPAIRMENTS: {opptimpairments:25111}.   ACTIVITY LIMITATIONS: {activitylimitations:27494}  PARTICIPATION LIMITATIONS: {participationrestrictions:25113}  PERSONAL FACTORS: {Personal factors:25162} are also affecting patient's functional outcome.   REHAB POTENTIAL: {rehabpotential:25112}  CLINICAL DECISION MAKING: {clinical decision  making:25114}  EVALUATION COMPLEXITY: {Evaluation complexity:25115}   GOALS: Goals reviewed with patient? {yes/no:20286}  SHORT TERM GOALS: Target date: ***  *** Baseline: Goal status: {GOALSTATUS:25110}  2.  *** Baseline:  Goal status: {GOALSTATUS:25110}  3.  *** Baseline:  Goal status: {GOALSTATUS:25110}  4.  *** Baseline:  Goal status: {GOALSTATUS:25110}  5.  *** Baseline:  Goal status: {GOALSTATUS:25110}  6.  *** Baseline:  Goal status: {GOALSTATUS:25110}  LONG TERM GOALS: Target date: ***  *** Baseline:  Goal status: {GOALSTATUS:25110}  2.  *** Baseline:  Goal status: {GOALSTATUS:25110}  3.  *** Baseline:  Goal status: {GOALSTATUS:25110}  4.  *** Baseline:  Goal status: {GOALSTATUS:25110}  5.  *** Baseline:  Goal status: {GOALSTATUS:25110}  6.  *** Baseline:  Goal status: {GOALSTATUS:25110}  PLAN:  PT FREQUENCY: {rehab frequency:25116}  PT DURATION: {rehab duration:25117}  PLANNED INTERVENTIONS: {rehab planned interventions:25118::"Therapeutic exercises","Therapeutic activity","Neuromuscular re-education","Balance training","Gait training","Patient/Family education","Self Care","Joint mobilization"}  PLAN FOR NEXT SESSION: ***   Camillo Flaming Tamila Gaulin, PT 09/27/2022, 5:02 PM

## 2022-09-28 ENCOUNTER — Ambulatory Visit: Payer: Commercial Managed Care - HMO | Attending: Obstetrics and Gynecology | Admitting: Physical Therapy

## 2022-09-29 ENCOUNTER — Ambulatory Visit (HOSPITAL_BASED_OUTPATIENT_CLINIC_OR_DEPARTMENT_OTHER): Admit: 2022-09-29 | Payer: Commercial Managed Care - HMO | Admitting: Obstetrics and Gynecology

## 2022-09-29 ENCOUNTER — Encounter (HOSPITAL_BASED_OUTPATIENT_CLINIC_OR_DEPARTMENT_OTHER): Payer: Self-pay

## 2022-09-29 SURGERY — LAPAROSCOPY, DIAGNOSTIC
Anesthesia: Choice

## 2022-10-11 ENCOUNTER — Encounter: Payer: Self-pay | Admitting: Obstetrics and Gynecology

## 2022-10-20 ENCOUNTER — Encounter: Payer: Self-pay | Admitting: Physical Therapy

## 2022-10-20 ENCOUNTER — Encounter: Payer: Self-pay | Admitting: Obstetrics and Gynecology

## 2022-10-20 ENCOUNTER — Encounter: Payer: Commercial Managed Care - HMO | Admitting: Nurse Practitioner

## 2022-10-20 ENCOUNTER — Encounter: Payer: Self-pay | Admitting: Student

## 2022-10-20 ENCOUNTER — Telehealth: Payer: Self-pay

## 2022-10-20 ENCOUNTER — Ambulatory Visit: Payer: Medicaid Other | Attending: Obstetrics and Gynecology | Admitting: Physical Therapy

## 2022-10-20 ENCOUNTER — Other Ambulatory Visit: Payer: Self-pay

## 2022-10-20 DIAGNOSIS — R293 Abnormal posture: Secondary | ICD-10-CM | POA: Diagnosis present

## 2022-10-20 DIAGNOSIS — M6281 Muscle weakness (generalized): Secondary | ICD-10-CM | POA: Diagnosis present

## 2022-10-20 DIAGNOSIS — M62838 Other muscle spasm: Secondary | ICD-10-CM | POA: Insufficient documentation

## 2022-10-20 DIAGNOSIS — R102 Pelvic and perineal pain: Secondary | ICD-10-CM | POA: Diagnosis present

## 2022-10-20 NOTE — Progress Notes (Signed)
Diane Rice,  I am very sorry that you are in so much pain. Your level of pain and related symptoms of weakness require an in person evaluation and likely imaging.   We cannot prescribe any narcotics or controlled substances within eVisits.   If you are having a true medical emergency please call 911.     For an urgent face to face visit, [facility] has six urgent care centers for your convenience:     [facility] [place] [facility] [place] Get Driving Directions [id number] [place], [place] [address]    [facility]) Get Driving Directions [id number] [place], [place] [address]  [place] [place] ([place]) Get Driving Directions [id number] [address] [place],  [place]  [address]  [facility] Get Driving Directions [id number] [place], [place], [place] [address]   [facility] Get Driving Directions  [id number] [address] [place], [place] [address]   [facility] [place] Get Driving Directions [id number] [address], [place], [place] [address]  Your MyChart E-visit questionnaire answers were reviewed by a board certified advanced clinical practitioner to complete your personal care plan based on your specific symptoms.  Thank you for using e-Visits.     I feel your condition warrants further evaluation and I recommend that you be seen in a face to face visit.   NOTE: There will be NO CHARGE for this eVisit   If you are having a true medical emergency please call 911.      For an urgent face to face visit, [facility] has eight urgent care centers for your convenience:   NEW!! [facility] [place] [facility] [place] Get Driving Directions [id number] [address] [place],  [place] [facility] [place] [facility] [place] Get Driving Directions [id number] [place], [place] [address]   [place] [facility] [place]) Get Driving Directions [id number] [address] [place], [place] [address]  [place] [facility] [place] ([place]) Get Driving Directions [id number] [address] [place],  [place]  [address]  [place] [facility] - at [place] Get Driving Directions  [id number] [place],  [place] [address]   [facility] Get Driving Directions [id number] [place], [place], [place] [address]   [place] [facility] Get Driving Directions  [id number] [address] [place], [place] [address]   [facility] [place] Get Driving Directions [id number] [address] [place],  [address]  Your MyChart E-visit questionnaire answers were reviewed by a board certified advanced clinical practitioner to complete your personal care plan based on your specific symptoms.  Thank you for using e-Visits.

## 2022-10-20 NOTE — Telephone Encounter (Addendum)
Pt returned call and reports that her requires her to several physical things that is hard for her to do due to the pain.  Pt states that she can not get on floor to count money in safe.  Pt wants doctor office to write that she is going to therapy.  Pt reports that her GM saw her having trouble with the issues that she listed on her paper that she can not bend or climb nor can she sweep.  Pt states that she is currently living in a shelter.  Pt also reports that she has had this pain for some time and every ER doctor has told her that she needs to not be working.  I explained to the pt that because her pain is not related to gyn issue she would need to contact her PCP for this letter.  Pt verbalized understanding.   Frances Nickels
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
## 2022-10-21 ENCOUNTER — Telehealth: Payer: Self-pay | Admitting: Student

## 2022-10-21 NOTE — Telephone Encounter (Signed)
error 

## 2022-11-17 ENCOUNTER — Telehealth: Payer: Self-pay | Admitting: *Deleted

## 2022-11-17 NOTE — Telephone Encounter (Signed)
Patient called in from her car. States she just got off work and is near tears from back pain. States, "back is so tight I can barely move my limbs." Pain is "shooting down legs." It is difficult for her to stand from sitting position. She is advised to head to ED/UC if pain is that severe. States when she goes there they tell her there is nothing they can do. She is given first available appt for 3/27 at 1315. Advised she call back to see if there have been any cancellations. States she may call chiropractic to see if they can see her in meantime.

## 2022-11-17 NOTE — Telephone Encounter (Signed)
Appt became available for tomorrow at 1345. Patient notified.

## 2022-11-18 ENCOUNTER — Ambulatory Visit (INDEPENDENT_AMBULATORY_CARE_PROVIDER_SITE_OTHER): Payer: Commercial Managed Care - HMO | Admitting: Student

## 2022-11-18 VITALS — BP 136/104 | HR 57 | Temp 98.1°F | Ht 72.0 in | Wt 296.1 lb

## 2022-11-18 DIAGNOSIS — M5441 Lumbago with sciatica, right side: Secondary | ICD-10-CM

## 2022-11-18 DIAGNOSIS — M5442 Lumbago with sciatica, left side: Secondary | ICD-10-CM

## 2022-11-18 DIAGNOSIS — G8929 Other chronic pain: Secondary | ICD-10-CM

## 2022-11-18 DIAGNOSIS — M545 Low back pain, unspecified: Secondary | ICD-10-CM

## 2022-11-18 LAB — GLUCOSE, CAPILLARY: Glucose-Capillary: 114 mg/dL — ABNORMAL HIGH (ref 70–99)

## 2022-11-18 LAB — POCT GLYCOSYLATED HEMOGLOBIN (HGB A1C): Hemoglobin A1C: 6.6 % — AB (ref 4.0–5.6)

## 2022-11-18 MED ORDER — DULOXETINE HCL 30 MG PO CPEP: 30.0000 mg | ORAL_CAPSULE | Freq: Every day | ORAL | 2 refills | Status: DC
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
## 2022-11-18 NOTE — Assessment & Plan Note (Signed)
Patient has BMI 40, qualifying her for obesity. Electronic medical record negative for documented A1C.  - Check A1C

## 2022-11-18 NOTE — Patient Instructions (Signed)
  Thank you, Ms.Rich Reining, for allowing [place] to provide your care today. Today we discussed . . .  > Back Pain       - we are prescribing you a medication called duloxetine that may help improve your leg pain, please take this every day       - we are also ordering an MRI of your lower back and will call you with the results   I have ordered the following labs for you:  Lab Orders         POC Hbg A1C       Tests ordered today:  Lumbar MRI   Referrals ordered today:   Referral Orders  No referral(s) requested today      I have ordered the following medication/changed the following medications:   Stop the following medications: There are no discontinued medications.   Start the following medications: Meds ordered this encounter  Medications   DULoxetine (CYMBALTA) 30 MG capsule    Sig: Take 1 capsule (30 mg total) by mouth daily.    Dispense:  30 capsule    Refill:  2      Follow up:  1 month     Remember:  Please start taking duloxetine every day for pain relief. We will call you with the results of your MRI and see you again in about one month!   Should you have any questions or concerns please call the internal medicine clinic at [phone].     Roswell Nickel, MD [place]
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
## 2022-11-19 ENCOUNTER — Telehealth: Payer: Self-pay | Admitting: *Deleted

## 2022-11-19 NOTE — Telephone Encounter (Signed)
Patient called in concerned about side effects of cymbalta that she read on line. Specifically abdominal pain and headaches. States she already has untreated migraines. Explained the benefits of Cymbalta for pain relief and that not everyone will experience these side effects. She is willing to try for 1 month and discuss titrating up at 1 month f/u. She will call back if she does experience side effects.

## 2022-11-19 NOTE — Progress Notes (Signed)
Internal Medicine Clinic Attending  Case discussed with Dr. Harper  At the time of the visit.  We reviewed the resident's history and exam and pertinent patient test results.  I agree with the assessment, diagnosis, and plan of care documented in the resident's note.  

## 2022-11-24 ENCOUNTER — Encounter: Payer: Commercial Managed Care - HMO | Admitting: Student

## 2022-11-30 ENCOUNTER — Encounter: Payer: Self-pay | Admitting: Physical Therapy

## 2022-11-30 ENCOUNTER — Ambulatory Visit: Payer: Medicaid Other | Attending: Obstetrics and Gynecology | Admitting: Physical Therapy

## 2022-11-30 DIAGNOSIS — R293 Abnormal posture: Secondary | ICD-10-CM | POA: Diagnosis present

## 2022-11-30 DIAGNOSIS — M6281 Muscle weakness (generalized): Secondary | ICD-10-CM | POA: Diagnosis present

## 2022-11-30 DIAGNOSIS — M62838 Other muscle spasm: Secondary | ICD-10-CM | POA: Diagnosis present
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
## 2022-12-06 ENCOUNTER — Encounter: Payer: Self-pay | Admitting: Student

## 2022-12-07 ENCOUNTER — Other Ambulatory Visit: Payer: Self-pay | Admitting: Student

## 2022-12-07 DIAGNOSIS — E119 Type 2 diabetes mellitus without complications: Secondary | ICD-10-CM

## 2022-12-07 MED ORDER — METFORMIN HCL ER (OSM) 500 MG PO TB24: 500.0000 mg | ORAL_TABLET | Freq: Every day | ORAL | 2 refills | Status: DC
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
## 2022-12-08 ENCOUNTER — Telehealth: Payer: Self-pay

## 2022-12-08 ENCOUNTER — Ambulatory Visit: Payer: Medicaid Other | Admitting: Physical Therapy

## 2022-12-08 ENCOUNTER — Other Ambulatory Visit (HOSPITAL_COMMUNITY)
Admission: RE | Admit: 2022-12-08 | Discharge: 2022-12-08 | Disposition: A | Discharge status: Home / Self Care | Payer: Medicaid Other | Source: Ambulatory Visit | Attending: Obstetrics and Gynecology | Admitting: Obstetrics and Gynecology

## 2022-12-08 ENCOUNTER — Other Ambulatory Visit: Payer: Self-pay | Admitting: Student

## 2022-12-08 ENCOUNTER — Other Ambulatory Visit: Payer: Self-pay

## 2022-12-08 ENCOUNTER — Encounter: Payer: Self-pay | Admitting: Physical Therapy

## 2022-12-08 ENCOUNTER — Ambulatory Visit (INDEPENDENT_AMBULATORY_CARE_PROVIDER_SITE_OTHER): Payer: Medicaid Other | Admitting: Obstetrics and Gynecology

## 2022-12-08 ENCOUNTER — Encounter: Payer: Self-pay | Admitting: Obstetrics and Gynecology

## 2022-12-08 VITALS — BP 137/75 | HR 75 | Ht 72.0 in | Wt 286.0 lb

## 2022-12-08 DIAGNOSIS — M62838 Other muscle spasm: Secondary | ICD-10-CM

## 2022-12-08 DIAGNOSIS — N939 Abnormal uterine and vaginal bleeding, unspecified: Secondary | ICD-10-CM | POA: Diagnosis present

## 2022-12-08 DIAGNOSIS — R293 Abnormal posture: Secondary | ICD-10-CM

## 2022-12-08 DIAGNOSIS — M6281 Muscle weakness (generalized): Secondary | ICD-10-CM

## 2022-12-08 LAB — POCT PREGNANCY, URINE: Preg Test, Ur: NEGATIVE

## 2022-12-08 MED ORDER — METFORMIN HCL 500 MG PO TABS: 500.0000 mg | ORAL_TABLET | Freq: Every day | ORAL | 2 refills | Status: DC

## 2022-12-08 NOTE — Telephone Encounter (Signed)
Prior Authorization for patient (metformin) came through on cover my meds was submitted with last office notes and labs awaiting approval or denial 

## 2022-12-08 NOTE — Progress Notes (Signed)
GYNECOLOGY VISIT  Patient name: Diane Rice MRN [id number]  Date of birth: [birth year] Chief Complaint:   Menstrual Problem (Bleeding after intercourse)  History:  Diane Rice is a 37 y.o. G2P2000 being seen today for postcoital bleeding.  LMP about 2.5 weeks ago and this was first time having bleeding after intercourse. Noted to have bleeding like she was on her period. Menses are still heavy. Open to doing LEEP as well.    Past Medical History:  Diagnosis Date   Anemia    Anemia    Asthma    Back pain, chronic    Hypertension    Muscle spasm     Past Surgical History:  Procedure Laterality Date   MOLE REMOVAL     WISDOM TOOTH EXTRACTION      The following portions of the patient's history were reviewed and updated as appropriate: allergies, current medications, past family history, past medical history, past social history, past surgical history and problem list.   Health Maintenance:   Last pap     Component Value Date/Time   DIAGPAP (A) 07/14/2022 1453    - High grade squamous intraepithelial lesion (HSIL)   HPVHIGH Positive (A) 07/14/2022 1453   ADEQPAP  07/14/2022 1453    Satisfactory for evaluation; transformation zone component PRESENT.     Review of Systems:  Pertinent items are noted in HPI. Comprehensive review of systems was otherwise negative.   Objective:  Physical Exam BP 137/75   Pulse 75   Ht 6' (1.829 m)   Wt 286 lb (129.7 kg)   LMP 12/03/2022   BMI 38.79 kg/m    Physical Exam Vitals and nursing note reviewed. Exam conducted with a chaperone present.  Constitutional:      Appearance: Normal appearance.  HENT:     Head: Normocephalic and atraumatic.  Pulmonary:     Effort: Pulmonary effort is normal.     Breath sounds: Normal breath sounds.  Genitourinary:    General: Normal vulva.     Exam position: Lithotomy position.     Vagina: Normal.     Cervix: Normal.  Skin:    General: Skin is warm and dry.  Neurological:      General: No focal deficit present.     Mental Status: She is alert.  Psychiatric:        Mood and Affect: Mood normal.        Behavior: Behavior normal.        Thought Content: Thought content normal.        Judgment: Judgment normal.      Labs and Imaging US PELVIC COMPLETE W TRANSVAGINAL AND TORSION R/O CLINICAL DATA:  Vaginal bleeding and pain after intercourse.  EXAM: TRANSABDOMINAL AND TRANSVAGINAL ULTRASOUND OF PELVIS  DOPPLER ULTRASOUND OF OVARIES  TECHNIQUE: Both transabdominal and transvaginal ultrasound examinations of the pelvis were performed. Transabdominal technique was performed for global imaging of the pelvis including uterus, ovaries, adnexal regions, and pelvic cul-de-sac.  It was necessary to proceed with endovaginal exam following the transabdominal exam to visualize the endometrium and ovaries. Color and duplex Doppler ultrasound was utilized to evaluate blood flow to the ovaries.  COMPARISON:  None Available.  FINDINGS: Uterus  Measurements: 9.0 x 5.0 x 5.9 cm = volume: 138 mL. No fibroids or other mass visualized.  Endometrium  Thickness: 11.8 mm. No focal abnormality visualized. Somewhat heterogeneous with mild increased vascularity.  Right ovary  Measurements: 2.5 x 1.2 x 1.9 cm = volume: 3.0 mL. Normal  appearance/no adnexal mass.  Left ovary  Measurements: 2.8 x 2.0 x 1.8 cm = volume: 5.1 mL. Normal appearance/no adnexal mass.  Pulsed Doppler evaluation of both ovaries demonstrates normal low-resistance arterial and venous waveforms.  Other findings  No abnormal free fluid.  IMPRESSION: 1. Normal uterus. 2. Normal ovaries with normal vascular flow. 3. Normal endometrial thickness with nonspecific mild heterogeneity and mild increased vascularity.  Electronically Signed   By: Elberta Fortis M.D.   On: 06/07/2022 12:07   Endometrial Biopsy Procedure  Patient identified, informed consent performed,  indication  reviewed, consent signed.  Reviewed risk of perforation, pain, bleeding, insufficient sample, etc were reviewd. Time out was performed.  Urine pregnancy test negative.  Speculum placed in the vagina.  Cervix visualized.  Cleaned with Betadine x 2.   Grasped anteriorly with a single tooth tenaculum.  Paracervical block was not administered.  Endometrial pipelle was passed twice without difficulty and sample obtained. Tenaculum was removed, good hemostasis noted.  Patient tolerated procedure well.  Patient was given post-procedure instructions.       Assessment & Plan:   1. Abnormal uterine bleeding (AUB) Now s/p uncomplicated EMB, will follow up surgical pathology. LEEP next visit for high grade abnormal pap.  - Surgical pathology    Diane Shire, MD Minimally In[facility], [facility]

## 2022-12-08 NOTE — Telephone Encounter (Signed)
Decision:Denied Manar Funches (Key: [id number]) PA Case ID #: [id number] Rx #: [id number] Need Help? Call us at [phone] Outcome Denied today Denied. Per the health plan's preferred drug list, at least 2 preferred drugs must be tried before requesting this drug or tell us why the member cannot try any preferred alternatives. Please send [place] supporting chart notes and lab results. Here is list of preferred alternatives: glipizide-metformin tablet (generic for Metaglip), glyburide-metformin tablet (generic for Glucovance), metformin tablet / ER tablet (generic for Glucophage/ ER). Note: Some preferred drug(s) may have quantity limits. Refer to the health plan's preferred drug list for additional details. Drug metFORMIN HCl ER (OSM) 500MG  er tablets ePA cloud logo Form [facility] Medicaid of [employer] Electronic Prior Authorization Request Form [phone] NCPDP) Original Claim Info 75 Submit 3 DS For Emerg Fill with PA Type01, PA Number [id number], Level of Service 3

## 2022-12-10 LAB — SURGICAL PATHOLOGY

## 2022-12-11 ENCOUNTER — Other Ambulatory Visit: Payer: Self-pay | Admitting: Student

## 2022-12-11 DIAGNOSIS — G8929 Other chronic pain: Secondary | ICD-10-CM

## 2022-12-14 ENCOUNTER — Ambulatory Visit: Payer: Medicaid Other | Admitting: Physical Therapy
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
## 2022-12-20 NOTE — Telephone Encounter (Signed)
Wanted to f/u to let you know this patient's MRI Authorization is still in review and was re submitted as the 1st request did not go through.  Please see below Info per Online: that was submitted again for authorization  Case Description: Lumbar Spine MRI Request ID: Tracking: Not Available [id number] Request Date: 2022-12-31 11:32 AM Status: In Review (Retrospective) Entry Method: RadMD Validity Dates: [Not Applicable] ICD10: M54.42, M54.41  Contact Name: Shon Hough (Referring Provider) Initial Determination Date: NOT COMPLETED   Final Determination Date: NOT COMPLETED  Please be advised that all data was current as of Monday, December 20, 2022 at 1:00 PM MST Radiology Date of Service: Dec 31, 2022   Expedited: No Extension: No CPT4: 72148 Billable Codes Clinical Rcvd: 12-31-2022 - Clinical information received via fax or upload

## 2022-12-22 ENCOUNTER — Ambulatory Visit: Payer: Medicaid Other | Admitting: Physical Therapy

## 2022-12-22 ENCOUNTER — Telehealth: Payer: Self-pay | Admitting: Physical Therapy
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
## 2022-12-22 NOTE — Telephone Encounter (Signed)
Patient did not show for appointment.  Patient was called and PT left message to please call us back.  Russella Dar, PT, DPT 12/22/22 9:50 AM

## 2022-12-29 ENCOUNTER — Encounter: Payer: Commercial Managed Care - HMO | Admitting: Physical Therapy

## 2022-12-30 ENCOUNTER — Telehealth: Payer: Self-pay | Admitting: *Deleted

## 2022-12-30 NOTE — Telephone Encounter (Signed)
Front Desk received following message today:  Appointment Request From: Diane Rice   With Provider: Rana Snare, DO [facility]]   Preferred Date Range: 12/30/2022 - 12/30/2022   Preferred Times: Any Time   Reason for visit: Office Visit   Comments: Unbearable back pain I barely cant move turn struggling to take deep breaths I'm coming in in a few I need help

## 2022-12-30 NOTE — Telephone Encounter (Signed)
Call placed to patient to discuss request below. Line went directly to VM. Left message on VM requesting return call.   Will also send MyChart message.

## 2023-01-03 ENCOUNTER — Encounter: Payer: Medicaid Other | Admitting: Student

## 2023-01-05 ENCOUNTER — Encounter: Payer: Self-pay | Admitting: *Deleted

## 2023-01-05 ENCOUNTER — Ambulatory Visit (INDEPENDENT_AMBULATORY_CARE_PROVIDER_SITE_OTHER): Payer: Medicaid Other | Admitting: Student

## 2023-01-05 ENCOUNTER — Encounter: Payer: Commercial Managed Care - HMO | Admitting: Physical Therapy

## 2023-01-05 ENCOUNTER — Encounter: Payer: Self-pay | Admitting: Student

## 2023-01-05 VITALS — BP 113/74 | HR 117 | Temp 98.0°F | Ht 72.0 in | Wt 289.6 lb

## 2023-01-05 DIAGNOSIS — M25471 Effusion, right ankle: Secondary | ICD-10-CM | POA: Diagnosis not present

## 2023-01-05 DIAGNOSIS — M545 Low back pain, unspecified: Secondary | ICD-10-CM

## 2023-01-05 DIAGNOSIS — D509 Iron deficiency anemia, unspecified: Secondary | ICD-10-CM

## 2023-01-05 MED ORDER — DULOXETINE HCL 60 MG PO CPEP
60.0000 mg | ORAL_CAPSULE | Freq: Every day | ORAL | 2 refills | Status: DC
Start: 2023-01-05 — End: 2023-01-27

## 2023-01-05 MED ORDER — FERROUS SULFATE 325 (65 FE) MG PO TBEC: 325.0000 mg | DELAYED_RELEASE_TABLET | ORAL | 11 refills | Status: DC

## 2023-01-05 NOTE — Progress Notes (Signed)
CC: Chronic back pain  HPI:  [name] is a 37 y.o. female living with a history stated below and presents today for chronic back pain. Please see problem based assessment and plan for additional details.  Past Medical History:  Diagnosis Date   Anemia    Anemia    Asthma    Back pain, chronic    Hypertension    Muscle spasm     Current Outpatient Medications on File Prior to Visit  Medication Sig Dispense Refill   ARIPiprazole (ABILIFY) 5 MG tablet Take 1/2 tab daily for 1 week and than full tab daily 30 tablet 0   DULoxetine (CYMBALTA) 30 MG capsule TAKE 1 CAPSULE BY MOUTH EVERY DAY 90 capsule 1   ferrous sulfate 325 (65 FE) MG tablet Take 1 tablet (325 mg total) by mouth every other day. 90 tablet 1   ibuprofen (ADVIL) 200 MG tablet Take 200 mg by mouth as directed.     metFORMIN (GLUCOPHAGE) 500 MG tablet Take 1 tablet (500 mg total) by mouth daily with breakfast. 30 tablet 2   [DISCONTINUED] sucralfate (CARAFATE) 1 g tablet Take 1 tablet (1 g total) by mouth 4 (four) times daily as needed. 30 tablet 0   No current facility-administered medications on file prior to visit.   Review of Systems: ROS negative except for what is noted on the assessment and plan.  Vitals:   01/05/23 1531  BP: 113/74  Pulse: (!) 117  Temp: 98 F (36.7 C)  TempSrc: Oral  SpO2: 99%  Weight: 289 lb 9.6 oz (131.4 kg)  Height: 6' (1.829 m)   Physical Exam: Constitutional: well-nourished, well-appearing female sitting up on exam table comfortably, in no acute distress HENT: normocephalic atraumatic Pulmonary/Chest: normal work of breathing on room air MSK: normal bulk and tone; Lumbar spine: tight right paraspinal muscles with tenderness to palpation of R>L paraspinal muscles, no obvious step offs or deformity, some difficulty with rotation of lumbar spine due to pain; Right Ankle: mild swelling, no deformity, normal ROM without pain Neurological: alert & oriented x 3 Skin: warm  and dry Psych: pleasant mood  Assessment & Plan:   Lumbar back pain Patient presents for chronic lower back pain. No significant worsening since last OV in March. Endorses muscle spasms of lower back more right than left with shooting pain down right leg. No red flag symptoms such as urinary retention, stool incontinence or numbness. Has noticed some right ankle swelling as well. Denies any recent injury or trauma. She was started on duloxetine and reports improvement of her pain. She continues to work with PT for her chronic pains. The back pain has impacted her daily life with functional limitations. She has difficulty at work when standing for long periods. Lumbar MRI was ordered at last OV. She has been taking ibuprofen. We will increase duloxetine dose given it has provided improvement. Discussed with patient to take tylenol for the pain rather than taking too much NSAIDs.   Plan -increase duloxetine to 60 mg daily -tylenol PRN for pain -pending lumbar MRI -continue with PT  Right ankle swelling Patient has noticed right ankle swelling. She is on her feet or sitting for her job. No recent injury or trauma of the ankle or leg. Denies any calf tenderness or lower leg swelling. Exam showed mild swelling of right ankle with no deformity, tenderness or increased warmth. Normal ROM. She is able to ambulate and put weight on her right foot. Potential strain at this  time. Recommend at this time to rest, ice and elevate right leg to help with swelling.   Patient discussed with Dr. Cresenciano Lick, D.O. [facility], PGY-1 Phone: [phone] Date 01/05/2023 Time 3:45 PM

## 2023-01-05 NOTE — Assessment & Plan Note (Addendum)
Patient presents for chronic lower back pain. No significant worsening since last OV in March. Endorses muscle spasms of lower back more right than left with shooting pain down right leg. No red flag symptoms such as urinary retention, stool incontinence or numbness. Has noticed some right ankle swelling as well. Denies any recent injury or trauma. She was started on duloxetine and reports improvement of her pain. She continues to work with PT for her chronic pains. The back pain has impacted her daily life with functional limitations. She has difficulty at work when standing for long periods. Lumbar MRI was ordered at last OV. She has been taking ibuprofen. We will increase duloxetine dose given it has provided improvement. Discussed with patient to take tylenol for the pain rather than taking too much NSAIDs.   Plan -increase duloxetine to 60 mg daily -tylenol PRN for pain -continue with PT  Addendum: Lumbar MRI that was ordered at prior OV was denied, will order lumbar xray at this time to evaluate

## 2023-01-05 NOTE — Assessment & Plan Note (Signed)
Patient has noticed right ankle swelling. She is on her feet or sitting for her job. No recent injury or trauma of the ankle or leg. Denies any calf tenderness or lower leg swelling. Exam showed mild swelling of right ankle with no deformity, tenderness or increased warmth. Normal ROM. She is able to ambulate and put weight on her right foot. Potential strain at this time. Recommend at this time to rest, ice and elevate right leg to help with swelling.

## 2023-01-05 NOTE — Patient Instructions (Addendum)
Thank you, Ms.Trenice Ahsan for allowing [place] to provide your care today. Today we discussed your back pain.  -Increase duloxetine to 60 mg one time a day. -Try to cut back on NSAID (ibuprofen, aleve, advil) -You can try tylenol up to 1000 mg about every 8 hours as needed -Please schedule the MRI of lumbar spine -Continue exercises and working with physical therapy  I have ordered the following labs for you:  Lab Orders  No laboratory test(s) ordered today    I have ordered the following medication/changed the following medications:   Stop the following medications: Medications Discontinued During This Encounter  Medication Reason   DULoxetine (CYMBALTA) 30 MG capsule    ferrous sulfate 325 (65 FE) MG tablet      Start the following medications: Meds ordered this encounter  Medications   DULoxetine (CYMBALTA) 60 MG capsule    Sig: Take 1 capsule (60 mg total) by mouth daily.    Dispense:  30 capsule    Refill:  2   ferrous sulfate 325 (65 FE) MG EC tablet    Sig: Take 1 tablet (325 mg total) by mouth every other day.    Dispense:  15 tablet    Refill:  11     Follow up:  3 weeks    Should you have any questions or concerns please call the internal medicine clinic at [phone].    Rana Snare, D.O. [facility]

## 2023-01-05 NOTE — Telephone Encounter (Signed)
LMON VM to see if patient could come to clinic today at 3:15 for evaluation. Also sent MyChart message.

## 2023-01-06 NOTE — Addendum Note (Signed)
Addended by: Rana Snare on: 01/06/2023 01:37 PM   Modules accepted: Orders

## 2023-01-07 NOTE — Progress Notes (Signed)
Internal Medicine Clinic Attending  Case discussed with Dr. Zheng  at the time of the visit.  We reviewed the resident's history and exam and pertinent patient test results.  I agree with the assessment, diagnosis, and plan of care documented in the resident's note.  

## 2023-01-10 ENCOUNTER — Encounter: Payer: Self-pay | Admitting: Obstetrics and Gynecology

## 2023-01-11 ENCOUNTER — Other Ambulatory Visit (HOSPITAL_COMMUNITY)
Admission: RE | Admit: 2023-01-11 | Discharge: 2023-01-11 | Disposition: A | Discharge status: Home / Self Care | Payer: Medicaid Other | Source: Ambulatory Visit | Attending: Obstetrics and Gynecology | Admitting: Obstetrics and Gynecology

## 2023-01-11 ENCOUNTER — Ambulatory Visit: Payer: Medicaid Other | Admitting: Obstetrics and Gynecology

## 2023-01-11 VITALS — BP 125/89 | HR 98

## 2023-01-11 DIAGNOSIS — R87810 Cervical high risk human papillomavirus (HPV) DNA test positive: Secondary | ICD-10-CM | POA: Diagnosis present

## 2023-01-11 HISTORY — PX: LEEP: SHX91

## 2023-01-11 MED ORDER — ACETAMINOPHEN 500 MG PO TABS
1000.0000 mg | ORAL_TABLET | Freq: Once | ORAL | Status: AC
  Administered 2023-01-11: 1000 mg via ORAL

## 2023-01-11 MED ORDER — ACETAMINOPHEN 500 MG PO TABS: 500.0000 mg | ORAL_TABLET | Freq: Once | ORAL | Status: DC

## 2023-01-11 NOTE — Progress Notes (Signed)
   GYNECOLOGY OFFICE PROCEDURE NOTE  Diane Rice is a 37 y.o. G2P2000 here for LEEP. 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  Role of HPV,cervical dysplasia and need for close followup was empasized. Informed written consent was obtained. All questions were answered. Time out performed. Urine pregnancy test was negative.  ??Procedure: The patient was placed in lithotomy position and the bivalved coated speculum was placed in the patient's vagina. A grounding pad placed on the patient. Acetic acid applied to the cervix and there were acetopwhite changes throughout the with prominence from 3 clock to 7 oclock. Lugol's solution was applied to the cervix and areas of decreased uptake were noted around the transformation zone, a few spots superiorly and most notably in posterior lip.   Local anesthesia was administered intracervical block and unilateral paracervical block using 10 ml of 2% Lidocaine with epinephrine. The suction was turned on and the Medium Fisher Cone Biopsy Excisor on 60 Watts of blended current was used to excise the area of decreased uptake and excise the entire transformation zone. Excellent hemostasis was achieved using roller ball coagulation set at 60 Watts coagulation current. Monsel's solution was then applied and the speculum was removed from the vagina. Specimens were sent to pathology.  ?The patient tolerated the procedure well. Post-operative instructions given to patient, including instruction to seek medical attention for persistent bright red bleeding, fever, abdominal/pelvic pain, dysuria, nausea or vomiting. She was also told  about the possibility of having copious yellow to black tinged discharge for weeks. She was counseled to avoid anything in the vagina (sex/douching/tampons) for 4 weeks. She has a 4 week post-procedure check to assess wound healing, review results and discuss further management.   Advised to take at least single dose of NSAID today, continue muscle relaxer and use extra strength tylenol for pain. Also recommend follow up with PT   [name], MD, FACOG Minimally Invasive Gynecologic Surgery  Obstetrics and Gynecology, [facility], [facility] 01/11/2023

## 2023-01-12 ENCOUNTER — Ambulatory Visit: Payer: Medicaid Other | Admitting: Physical Therapy

## 2023-01-13 LAB — SURGICAL PATHOLOGY

## 2023-01-17 LAB — AMB RESULTS CONSOLE CBG: Glucose: 151

## 2023-01-17 NOTE — Progress Notes (Signed)
Pt received SDOH for food and transportation. Pt has PCP.

## 2023-01-19 ENCOUNTER — Encounter: Payer: Commercial Managed Care - HMO | Admitting: Physical Therapy

## 2023-01-26 ENCOUNTER — Telehealth: Payer: Self-pay | Admitting: *Deleted

## 2023-01-26 NOTE — Telephone Encounter (Signed)
I called patient to discuss her MyChart message and left message I was calling to discuss her message and will send detailed MyChart message for her to read. Diane Rice

## 2023-01-27 ENCOUNTER — Other Ambulatory Visit: Payer: Self-pay | Admitting: Student

## 2023-01-27 DIAGNOSIS — M545 Low back pain, unspecified: Secondary | ICD-10-CM

## 2023-02-01 ENCOUNTER — Encounter: Payer: Medicaid Other | Admitting: Dietician

## 2023-02-03 ENCOUNTER — Encounter: Payer: Self-pay | Admitting: *Deleted

## 2023-02-03 NOTE — Progress Notes (Addendum)
 Pt attended screening event on 01/17/2023 at [facility], where screening results BP 118/90 and Glucose 151. At the event, Pt confirmed Dr. Jearldine Mina, as her PCP, at [facility] and indicated food and transportation insecurities. Event documentation note shows that Pt was provided with food and transportation resources at the event. Per chart review, Pt was seen by PCP on 01/05/23. Pt also has upcomming OBGYN appointment on 02/09/23. Pt was contacted by phone for BP and Glucose follow up. Pt shared that she has not contacted her primary physician because her PCP can see the screening result on her chart. Caller informed Pt that if her PCP is in [facility], the screening result are visible but it is important to inform her doctor because PCP might not be aware that she attended screening event. Pt stated she will call and make an appointment. Pt's screening results was sent to PCP through [facility] message. No additional health equity team support indicated at this time.   02/10/23: Pt's PCP replied to the In-basket message saying Thank you for sending this over.

## 2023-02-09 ENCOUNTER — Encounter: Payer: Self-pay | Admitting: *Deleted

## 2023-02-09 ENCOUNTER — Ambulatory Visit (INDEPENDENT_AMBULATORY_CARE_PROVIDER_SITE_OTHER): Payer: Medicaid Other | Admitting: Obstetrics and Gynecology

## 2023-02-09 ENCOUNTER — Encounter: Payer: Self-pay | Admitting: Obstetrics and Gynecology

## 2023-02-09 ENCOUNTER — Other Ambulatory Visit (HOSPITAL_COMMUNITY)
Admission: RE | Admit: 2023-02-09 | Discharge: 2023-02-09 | Disposition: A | Discharge status: Home / Self Care | Payer: Medicaid Other | Source: Ambulatory Visit | Attending: Obstetrics and Gynecology | Admitting: Obstetrics and Gynecology

## 2023-02-09 VITALS — BP 137/86 | HR 73 | Wt 295.9 lb

## 2023-02-09 DIAGNOSIS — D069 Carcinoma in situ of cervix, unspecified: Secondary | ICD-10-CM

## 2023-02-09 DIAGNOSIS — N939 Abnormal uterine and vaginal bleeding, unspecified: Secondary | ICD-10-CM

## 2023-02-09 DIAGNOSIS — Z3169 Encounter for other general counseling and advice on procreation: Secondary | ICD-10-CM

## 2023-02-09 DIAGNOSIS — N898 Other specified noninflammatory disorders of vagina: Secondary | ICD-10-CM | POA: Diagnosis present

## 2023-02-09 MED ORDER — TRANEXAMIC ACID 650 MG PO TABS: 1300.0000 mg | ORAL_TABLET | Freq: Three times a day (TID) | ORAL | 6 refills | Status: DC

## 2023-02-09 NOTE — Patient Instructions (Addendum)
Partner needs to get a semen analysis completed to analyze his sperm - this can be done with a primary care provider   Call [phone] on the first day of your period to schedule a HSG (special xray to see if your tubes are open)

## 2023-02-09 NOTE — Progress Notes (Signed)
GYNECOLOGY VISIT  Patient name: Analilia Geddis MRN [id number]  Date of birth: [birth year] Chief Complaint:   Follow-up   History:  Aliea Bobe is a 37 y.o. G2P2000 being seen today for post procedure evaluation. Has had some yellow discharge. Has not been back to PT in some time due to feeling increased soreness after last visit and not sure if helping. Also looking to get new insurance and instructed to contact them when switched. Not taking TXA for menses currently. Still wanting to conceive - no semen analysis previously completed.     Past Medical History:  Diagnosis Date   Anemia    Anemia    Asthma    Back pain, chronic    Hypertension    Muscle spasm     Past Surgical History:  Procedure Laterality Date   MOLE REMOVAL     WISDOM TOOTH EXTRACTION      The following portions of the patient's history were reviewed and updated as appropriate: allergies, current medications, past family history, past medical history, past social history, past surgical history and problem list.   Health Maintenance:   Last pap     Component Value Date/Time   DIAGPAP (A) 07/14/2022 1453    - High grade squamous intraepithelial lesion (HSIL)   HPVHIGH Positive (A) 07/14/2022 1453   ADEQPAP  07/14/2022 1453    Satisfactory for evaluation; transformation zone component PRESENT.   FINAL MICROSCOPIC DIAGNOSIS:   A. 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 ENDOCERVIX, CURETTAGE:  Benign endocervical epithelium  Superficial fragments of proliferative endometrium with stromal  breakdown and reactive metaplasia consistent with bleeding     Review of Systems:  Pertinent items are noted in HPI. Comprehensive review of systems was otherwise negative.   Objective:  Physical Exam BP 137/86    Pulse 73   Wt 295 lb 14.4 oz (134.2 kg)   LMP 01/01/2023 (Exact Date)   BMI 40.13 kg/m    Physical Exam Vitals and nursing note reviewed. Exam conducted with a chaperone present.  Constitutional:      Appearance: Normal appearance.  HENT:     Head: Normocephalic and atraumatic.  Pulmonary:     Effort: Pulmonary effort is normal.     Breath sounds: Normal breath sounds.  Genitourinary:    General: Normal vulva.     Exam position: Lithotomy position.     Vagina: Normal.     Cervix: Normal.     Comments: Well-healing cervical bed, without any areas of erythema Small-volume yellow discharge Mild CMT with cotton swab Skin:    General: Skin is warm and dry.  Neurological:     General: No focal deficit present.     Mental Status: She is alert.  Psychiatric:        Mood and Affect: Mood normal.        Behavior: Behavior normal.        Thought Content: Thought content normal.        Judgment: Judgment normal.          Assessment & Plan:   1. High grade squamous intraepithelial lesion (HGSIL), grade 3 CIN, on biopsy of cervix 4 weeks s/p LEEP, reviewed pathology and noted to have positive margins on LEEP specimen, but negative endocervical curetting.  Discussed that the intention is for future pregnancy, will  plan for repeat Pap in 6 months, with the understanding there may be need for additional treatments if there is recurrence of disease or remaining disease.  Also noted the patient will need to continue with Paps for at least the next 25 years whether or not she has a hysterectomy in the future.  Mild tenderness on exam, recommend pelvic rest for an additional 2 weeks.  2. Infertility counseling Patient with declined history of infertility, no semen analysis completed by partner.  Will order HSG for tubal patency.  Noted the patient's insurance currently not excepted by most reproductive clinics, but there may be financial plans or can do cash pay.  Recommend partner get  semen analysis completed.  Number given for radiology scheduling so that HSG can be completed.  - DG Hysterogram (HSG); Future  3. Vaginal discharge Small-volume discharge noted on exam, likely reactive secondary to the, vaginitis swab collected. - Cervicovaginal ancillary only  4. Abnormal uterine bleeding (AUB) Recommend resumption of TXA for AUB, as it had can help with flow without preventing pregnancy. - tranexamic acid (LYSTEDA) 650 MG TABS tablet; Take 2 tablets (1,300 mg total) by mouth 3 (three) times daily. Take during menses for a maximum of five days  Dispense: 30 tablet; Refill: 6    Routine preventative health maintenance measures emphasized.  [name], MD [facility], [facility]

## 2023-02-10 ENCOUNTER — Other Ambulatory Visit: Payer: Self-pay | Admitting: Obstetrics and Gynecology

## 2023-02-10 DIAGNOSIS — B9689 Other specified bacterial agents as the cause of diseases classified elsewhere: Secondary | ICD-10-CM

## 2023-02-10 LAB — CERVICOVAGINAL ANCILLARY ONLY
Bacterial Vaginitis (gardnerella): POSITIVE — AB
Candida Glabrata: NEGATIVE
Candida Vaginitis: NEGATIVE
Comment: NEGATIVE
Comment: NEGATIVE
Comment: NEGATIVE

## 2023-02-10 MED ORDER — METRONIDAZOLE 500 MG PO TABS
500.0000 mg | ORAL_TABLET | Freq: Two times a day (BID) | ORAL | 0 refills | Status: AC
Start: 2023-02-10 — End: 2023-02-17

## 2023-02-23 ENCOUNTER — Encounter: Payer: Self-pay | Admitting: Obstetrics and Gynecology

## 2023-03-22 ENCOUNTER — Ambulatory Visit (INDEPENDENT_AMBULATORY_CARE_PROVIDER_SITE_OTHER): Payer: Medicaid Other | Admitting: Mental Health

## 2023-03-22 DIAGNOSIS — F39 Unspecified mood [affective] disorder: Secondary | ICD-10-CM

## 2023-03-22 DIAGNOSIS — F4310 Post-traumatic stress disorder, unspecified: Secondary | ICD-10-CM

## 2023-03-22 DIAGNOSIS — F1020 Alcohol dependence, uncomplicated: Secondary | ICD-10-CM | POA: Insufficient documentation
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
## 2023-05-11 ENCOUNTER — Ambulatory Visit (INDEPENDENT_AMBULATORY_CARE_PROVIDER_SITE_OTHER): Payer: Medicaid Other | Admitting: Mental Health

## 2023-05-11 ENCOUNTER — Encounter (HOSPITAL_COMMUNITY): Payer: Self-pay

## 2023-05-11 DIAGNOSIS — F39 Unspecified mood [affective] disorder: Secondary | ICD-10-CM | POA: Diagnosis not present

## 2023-05-11 DIAGNOSIS — F1020 Alcohol dependence, uncomplicated: Secondary | ICD-10-CM | POA: Diagnosis not present

## 2023-05-11 DIAGNOSIS — F4310 Post-traumatic stress disorder, unspecified: Secondary | ICD-10-CM

## 2023-05-11 DIAGNOSIS — F29 Unspecified psychosis not due to a substance or known physiological condition: Secondary | ICD-10-CM | POA: Diagnosis not present

## 2023-05-11 NOTE — Progress Notes (Signed)
THERAPIST PROGRESS NOTE Virtual Visit via Video Note  I connected with Diane Rice on 05/11/23 at  8:00 AM EDT by a video enabled telemedicine application and verified that I am speaking with the correct person using two identifiers.  Location: Patient: home address on file Provider: office   I discussed the limitations of evaluation and management by telemedicine and the availability of in person appointments. The patient expressed understanding and agreed to proceed.  I discussed the assessment and treatment plan with the patient. The patient was provided an opportunity to ask questions and all were answered. The patient agreed with the plan and demonstrated an understanding of the instructions.   The patient was advised to call back or seek an in-person evaluation if the symptoms worsen or if the condition fails to improve as anticipated.  I provided 45 minutes of non-face-to-face time during this encounter.   Dorris Singh, [facility]   Session Time: 8:15 am (45 minutes)  Participation Level: Active  Behavioral Response: Fairly GroomedAlertDysphoric  Type of Therapy: Individual Therapy  Treatment Goals addressed: STG: "Manage my moods." Diane (Atlas) will increase management of moods AEB daily medication compliance with development of x 3 effective emotional regulation skills with ability to process thoughts and feelings in healthy balanced manner per self report within the next 90 days.   ProgressTowards Goals: Initial  Interventions: Supportive  Summary: Diane Rice is a 37 y.o. female who presents with dx of Unspecified mood disorder rule out schizoaffective bipolar type vs. Bipolar disorder with psychotic features, PTSD and alcohol use disorder moderate. Diane Rice, who prefers to go by Diane Rice, presents for session alert and oriented; mood and affect adequate; speech clear and coherent at normal rate and tone. Engaged and receptive to interventions.  Shares ongoing concerns for fluctuating and irritable expansive moods. Shares difficulty maintaining ability to regulate moods sharing high degree of mood swings. Shares initially resistant to starting medications however now feels to be open. Shares has been able to find housing and in need of working full time. Shares to have been let go from previous work with long history of difficulty maintaining positions. Shares for sleep to continue to be poor, waking at 3am and staying up for the day. Shares periods in which sexual drive increases and she will make advances towards other men, increase in energy with increased in cooking and increase in attending to business however never able to complete tasks prior to mood crashing and becoming depressed. Shares presence of psychotic sxs of hearing voices of negative content and command and notes for this to occur daily regardless of mood. Shares experiences of hearing recording of voices of someone with schizophrenia and identifies with this however denies for it to be as severe. Shares for sister to hold schizophrenia diagnoses. Notes can take pictures of things she sees however no others see or hear what she does. Engages in treatment plan with therapist and shares desire to "manager her moods." Notes can often keep things to herself and will fail to communicate which can lead to anger outburst. Agrees to treatment plan. Denies current SIHI. Agrees to present for walk in PE this Friday. No change in sxs at this time.    Suicidal/Homicidal: Nowithout intent/plan  Therapist Response: Therapist engaged pt in tele-therapy session. Completed check in and assessed for current level of functioning sxs management and level of stressors. Reviewed assessment, informed consent and confidentiality. Active empathic listening reviewing current concerns and sxs present. Educated on bipolar disorder and manic  episodes as well as schizoaffective disorder. Supported Diane in  processing concerns , difficulty with boyfriend/husband and concerns children express for her moods. Explored desire to start medications and educated on benefits of regulating moods and reducing presence of psychotic sxs. Engaged in treatment planning and what progress would look like. Assessed for safety. Encouraged tracking moods. Reviewed session and provided follow up.   Plan: Return again in  x 6 weeks.  Diagnosis: Mood disorder (HCC)  PTSD (post-traumatic stress disorder)  Alcohol use disorder, moderate, dependence (HCC)  Psychosis, unspecified psychosis type (HCC)  Collaboration of Care: Other None  Patient/Guardian was advised Release of Information must be obtained prior to any record release in order to collaborate their care with an outside provider. Patient/Guardian was advised if they have not already done so to contact the registration department to sign all necessary forms in order for [place] to release information regarding their care.   Consent: Patient/Guardian gives verbal consent for treatment and assignment of benefits for services provided during this visit. Patient/Guardian expressed understanding and agreed to proceed.   Stephan Minister [place], [facility] 05/11/2023

## 2023-05-19 ENCOUNTER — Ambulatory Visit (INDEPENDENT_AMBULATORY_CARE_PROVIDER_SITE_OTHER): Payer: Medicaid Other | Admitting: Physician Assistant

## 2023-05-19 VITALS — BP 136/84 | HR 61 | Temp 97.9°F | Ht 72.0 in | Wt 286.0 lb

## 2023-05-19 DIAGNOSIS — F39 Unspecified mood [affective] disorder: Secondary | ICD-10-CM

## 2023-05-19 DIAGNOSIS — F1020 Alcohol dependence, uncomplicated: Secondary | ICD-10-CM | POA: Diagnosis not present

## 2023-05-19 DIAGNOSIS — F4310 Post-traumatic stress disorder, unspecified: Secondary | ICD-10-CM | POA: Diagnosis not present

## 2023-05-19 MED ORDER — LAMOTRIGINE 25 MG PO TABS
ORAL_TABLET | ORAL | 1 refills | Status: DC
Start: 2023-05-19 — End: 2024-07-13

## 2023-05-19 MED ORDER — TRAZODONE HCL 50 MG PO TABS
50.0000 mg | ORAL_TABLET | Freq: Every day | ORAL | 1 refills | Status: DC
Start: 2023-05-19 — End: 2024-07-13
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
## 2023-05-20 ENCOUNTER — Encounter (HOSPITAL_COMMUNITY): Payer: Self-pay | Admitting: Physician Assistant

## 2023-06-24 ENCOUNTER — Ambulatory Visit (INDEPENDENT_AMBULATORY_CARE_PROVIDER_SITE_OTHER): Payer: Medicaid Other | Admitting: Mental Health

## 2023-06-24 DIAGNOSIS — F4310 Post-traumatic stress disorder, unspecified: Secondary | ICD-10-CM

## 2023-06-24 DIAGNOSIS — F29 Unspecified psychosis not due to a substance or known physiological condition: Secondary | ICD-10-CM

## 2023-06-24 DIAGNOSIS — F3110 Bipolar disorder, current episode manic without psychotic features, unspecified: Secondary | ICD-10-CM | POA: Insufficient documentation

## 2023-06-24 NOTE — Progress Notes (Unsigned)
THERAPIST PROGRESS NOTE Virtual Visit via Video Note  I connected with Rosia Rhome on 06/24/2023 at 10:00 AM EDT by a video enabled telemedicine application and verified that I am speaking with the correct person using two identifiers.  Location: Patient: [employer] parking lot Provider: home office   I discussed the limitations of evaluation and management by telemedicine and the availability of in person appointments. The patient expressed understanding and agreed to proceed.  I discussed the assessment and treatment plan with the patient. The patient was provided an opportunity to ask questions and all were answered. The patient agreed with the plan and demonstrated an understanding of the instructions.   The patient was advised to call back or seek an in-person evaluation if the symptoms worsen or if the condition fails to improve as anticipated.  I provided 51 minutes of non-face-to-face time during this encounter.   Dorris Singh, [facility]   Session Time: 10:04am ( 51 minutes)  Participation Level: Active  Behavioral Response: CasualAlertAdequate  Type of Therapy: Individual Therapy  Treatment Goals addressed: STG: "Manage my moods." Tabiya (Lloyd) will increase management of moods AEB daily medication compliance with development of x 3 effective emotional regulation skills with ability to process thoughts and feelings in healthy balanced manner per self report within the next 90 days.   ProgressTowards Goals: Progressing  Interventions: CBT and Supportive  Summary: Merrilie Ridpath is a 37 y.o. female who presents with dx of Unspecified mood disorder rule out schizoaffective bipolar type vs. Bipolar disorder with psychotic features, PTSD and alcohol use disorder moderate. Nakeysha, who prefers to go by [place], presents for session alert and oriented; mood and affect adequate; speech clear and coherent at normal rate and tone. Shares for moods have  leveled out currently however shares recent episode in which moods were elevated and depressed for a period of x 3 weeks in which she was drinking excessively, low mood. Shares for sleep to have been poor, increased anxiety. Shares ongoing stressors related to work and family and taking care of children. Shares has been inconsistent with medications and concerns for medications she is taking for medical and mental health and feels she becomes excessively drowsy. Shares concerns with having female medication provider and prefers to have a female provider; feeling put of by men and difficulty vocalizing with men. Engaged with therapist and explored working to establish steady routine as notes days are 'all over the place' which adds to stressors. Explored working to identify distorted thoughts and keeping balance thinking. Denies SI/HI. Ongoing work towards goals.   Suicidal/Homicidal: Nowithout intent/plan  Therapist Response: Therapist engaged pt in tele-therapy session. Completed check in and assessed for current level of functioning sxs management and level of stressors.  Active empathic listening reviewing current concerns and levels of stressors. Explored recent reports of probable mixed episode of depression and mania. Explored hx of manic and depressive episodes occurring. Educated on working to establish routine and schedule in daily life to support in decreasing stressors. Discussed ability to process thoughts in balanced manner and working to manage emotional regulation and distress. Reviewed session and provided follow up. Supported in Doctor, hospital to obtain female provider. Encouraged importance of medication management in supported in stabilizing moods.  Plan: Return again in  x 5 weeks.  Diagnosis: Bipolar I disorder, most recent episode (or current) manic (HCC)  PTSD (post-traumatic stress disorder)  Psychosis, unspecified psychosis type (HCC)  Collaboration of Care: Medication Management AEB  Supported in transfer to female therapist  Patient/Guardian was advised Release of Information must be obtained prior to any record release in order to collaborate their care with an outside provider. Patient/Guardian was advised if they have not already done so to contact the registration department to sign all necessary forms in order for [place] to release information regarding their care.   Consent: Patient/Guardian gives verbal consent for treatment and assignment of benefits for services provided during this visit. Patient/Guardian expressed understanding and agreed to proceed.   Stephan Minister [place], [facility] 06/24/2023

## 2023-06-29 ENCOUNTER — Encounter: Payer: Self-pay | Admitting: Obstetrics and Gynecology

## 2023-06-29 ENCOUNTER — Telehealth: Payer: Medicaid Other | Admitting: Physician Assistant

## 2023-06-29 DIAGNOSIS — E119 Type 2 diabetes mellitus without complications: Secondary | ICD-10-CM

## 2023-06-29 DIAGNOSIS — G894 Chronic pain syndrome: Secondary | ICD-10-CM

## 2023-06-29 NOTE — Patient Instructions (Signed)
  Diane Rice, thank you for joining Piedad Climes, PA-C for today's virtual visit.  While this provider is not your primary care provider (PCP), if your PCP is located in our provider database this encounter information will be shared with them immediately following your visit.   A [facility] MyChart account gives you access to today's visit and all your visits, tests, and labs performed at [facility] " click here if you don't have a [facility] MyChart account or go to [email or web address]  Consent: (Patient) Diane Rice provided verbal consent for this virtual visit at the beginning of the encounter.  Current Medications:  Current Outpatient Medications:    ARIPiprazole (ABILIFY) 5 MG tablet, Take 1/2 tab daily for 1 week and than full tab daily, Disp: 30 tablet, Rfl: 0   DULoxetine (CYMBALTA) 60 MG capsule, Take 1 capsule (60 mg total) by mouth daily., Disp: 90 capsule, Rfl: 3   ferrous sulfate 325 (65 FE) MG EC tablet, Take 1 tablet (325 mg total) by mouth every other day., Disp: 15 tablet, Rfl: 11   ibuprofen (ADVIL) 200 MG tablet, Take 200 mg by mouth as directed., Disp: , Rfl:    lamoTRIgine (LAMICTAL) 25 MG tablet, Take 1 tablet (25 mg total) by mouth daily for 14 days, THEN 2 tablets (50 mg total) daily., Disp: 60 tablet, Rfl: 1   metFORMIN (GLUCOPHAGE) 500 MG tablet, Take 1 tablet (500 mg total) by mouth daily with breakfast., Disp: 30 tablet, Rfl: 2   tranexamic acid (LYSTEDA) 650 MG TABS tablet, Take 2 tablets (1,300 mg total) by mouth 3 (three) times daily. Take during menses for a maximum of five days, Disp: 30 tablet, Rfl: 6   traZODone (DESYREL) 50 MG tablet, Take 1 tablet (50 mg total) by mouth at bedtime., Disp: 30 tablet, Rfl: 1   Medications ordered in this encounter:  No orders of the defined types were placed in this encounter.    *If you need refills on other medications prior to your next appointment, please contact your  pharmacy*  Follow-Up: Call back or seek an in-person evaluation if the symptoms worsen or if the condition fails to improve as anticipated.  [facility] [phone]  Other Instructions   If you have been instructed to have an in-person evaluation today at a local Urgent Care facility, please use the link below. It will take you to a list of all of our available [facility], including address, phone number and hours of operation. Please do not delay care.  [facility]  If you or a family member do not have a primary care provider, use the link below to schedule a visit and establish care. When you choose a [facility] primary care physician or advanced practice provider, you gain a long-term partner in health. Find a Primary Care Provider  Learn more about [facility] in-office and virtual care options:  - Get Care Now

## 2023-06-29 NOTE — Progress Notes (Signed)
Virtual Visit Consent   Diane Rice, you are scheduled for a virtual visit with a [facility] provider today. Just as with appointments in the office, your consent must be obtained to participate. Your consent will be active for this visit and any virtual visit you may have with one of our providers in the next 365 days. If you have a MyChart account, a copy of this consent can be sent to you electronically.  As this is a virtual visit, video technology does not allow for your provider to perform a traditional examination. This may limit your provider's ability to fully assess your condition. If your provider identifies any concerns that need to be evaluated in person or the need to arrange testing (such as labs, EKG, etc.), we will make arrangements to do so. Although advances in technology are sophisticated, we cannot ensure that it will always work on either your end or our end. If the connection with a video visit is poor, the visit may have to be switched to a telephone visit. With either a video or telephone visit, we are not always able to ensure that we have a secure connection.  By engaging in this virtual visit, you consent to the provision of healthcare and authorize for your insurance to be billed (if applicable) for the services provided during this visit. Depending on your insurance coverage, you may receive a charge related to this service.  I need to obtain your verbal consent now. Are you willing to proceed with your visit today? Diane Rice has provided verbal consent on 06/29/2023 for a virtual visit (video or telephone). Piedad Climes, [place]  Date: 06/29/2023 8:30 AM  Virtual Visit via Video Note   I, Piedad Climes, connected with  Diane Rice  ([id number], 1986-03-03) on 06/29/23 at  8:15 AM EDT by a video-enabled telemedicine application and verified that I am speaking with the correct person using two identifiers.  Location: Patient: Virtual  Visit Location Patient: Home Provider: Virtual Visit Location Provider: Home Office   I discussed the limitations of evaluation and management by telemedicine and the availability of in person appointments. The patient expressed understanding and agreed to proceed.    History of Present Illness: Diane Rice is a 37 y.o. who identifies as a female who was assigned female at birth, and is being seen today for request to switch medications. Is currently on Duloxetine 60 mg for her chronic low back pain and feels that it is too much medication for her. Takes the Duloxetine at night and the next day feels quite out of it for a few days. Feels like it lasts too long but does not help with her lower back pain. As such she does not take regularly. Also taking Metformin for DM which makes her feel sick to her stomach. Would also like to discuss initiation of Ozempic for weight loss. Notes she did reach out to her PCP a couple of months ago but never got a response from them.    HPI: HPI  Problems:  Patient Active Problem List   Diagnosis Date Noted   Bipolar I disorder, most recent episode (or current) manic (HCC) 06/24/2023   Psychosis (HCC) 05/11/2023   Alcohol use disorder, moderate, dependence (HCC) 03/22/2023   Right ankle swelling 01/05/2023   Dysmenorrhea 07/11/2022   Migraine without status migrainosus, not intractable 07/09/2022   Homelessness 07/09/2022   Health care maintenance 07/09/2022   PTSD (post-traumatic stress disorder) 07/06/2022   OBESITY 03/19/2010  Lumbar back pain 03/19/2010   Vitamin D deficiency 01/01/2010   ANEMIA-IRON DEFICIENCY 12/30/2009   Mood disorder (HCC) 12/30/2009   Chronic pain syndrome 12/30/2009   Asthma 12/30/2009    Allergies:  Allergies  Allergen Reactions   Hydrocodone-Acetaminophen Itching and Other (See Comments)    hallucinations   Medications:  Current Outpatient Medications:    ARIPiprazole (ABILIFY) 5 MG tablet, Take 1/2 tab daily  for 1 week and than full tab daily, Disp: 30 tablet, Rfl: 0   DULoxetine (CYMBALTA) 60 MG capsule, Take 1 capsule (60 mg total) by mouth daily., Disp: 90 capsule, Rfl: 3   ferrous sulfate 325 (65 FE) MG EC tablet, Take 1 tablet (325 mg total) by mouth every other day., Disp: 15 tablet, Rfl: 11   ibuprofen (ADVIL) 200 MG tablet, Take 200 mg by mouth as directed., Disp: , Rfl:    lamoTRIgine (LAMICTAL) 25 MG tablet, Take 1 tablet (25 mg total) by mouth daily for 14 days, THEN 2 tablets (50 mg total) daily., Disp: 60 tablet, Rfl: 1   metFORMIN (GLUCOPHAGE) 500 MG tablet, Take 1 tablet (500 mg total) by mouth daily with breakfast., Disp: 30 tablet, Rfl: 2   tranexamic acid (LYSTEDA) 650 MG TABS tablet, Take 2 tablets (1,300 mg total) by mouth 3 (three) times daily. Take during menses for a maximum of five days, Disp: 30 tablet, Rfl: 6   traZODone (DESYREL) 50 MG tablet, Take 1 tablet (50 mg total) by mouth at bedtime., Disp: 30 tablet, Rfl: 1  Observations/Objective: Patient is well-developed, well-nourished in no acute distress.  Resting comfortably  at home.  Head is normocephalic, atraumatic.  No labored breathing.  Speech is clear and coherent with logical content.  Patient is alert and oriented at baseline.   Assessment and Plan: 1. Chronic pain syndrome  2. Controlled type 2 diabetes mellitus without complication, without long-term current use of insulin (HCC)  Discussed we are virtual urgent care and cannot make changes in chronic medication regimens or start new chronic medications. We also do not prescribe medications for weight management. Will send cc'd chart to PCP for FYI. She needs to reach out back to them. If still no response, resources given to help her be seen in person.   Follow Up Instructions: I discussed the assessment and treatment plan with the patient. The patient was provided an opportunity to ask questions and all were answered. The patient agreed with the plan and  demonstrated an understanding of the instructions.  A copy of instructions were sent to the patient via MyChart unless otherwise noted below.    The patient was advised to call back or seek an in-person evaluation if the symptoms worsen or if the condition fails to improve as anticipated.    Piedad Climes, PA-C

## 2023-06-30 ENCOUNTER — Encounter (HOSPITAL_COMMUNITY): Payer: Medicaid Other | Admitting: Physician Assistant

## 2023-07-08 ENCOUNTER — Telehealth (HOSPITAL_COMMUNITY): Payer: Self-pay | Admitting: Mental Health

## 2023-07-08 ENCOUNTER — Ambulatory Visit (HOSPITAL_COMMUNITY): Payer: Medicaid Other | Admitting: Mental Health

## 2023-07-08 ENCOUNTER — Encounter (HOSPITAL_COMMUNITY): Payer: Self-pay

## 2023-07-08 NOTE — Telephone Encounter (Signed)
Therapist sent link x 2 for tele-therapy session. No response after x 10 minutes. Therapist contacted pt via telephone who stated she was just getting in her home and would connect to appointment.Therapist awaited another x 15 minutes with no response. Therapist disconnected. [facility] front desk alerted.

## 2023-07-12 ENCOUNTER — Encounter (HOSPITAL_COMMUNITY): Payer: Medicaid Other | Admitting: Student

## 2023-08-14 ENCOUNTER — Encounter: Payer: Self-pay | Admitting: Obstetrics and Gynecology

## 2023-08-15 ENCOUNTER — Other Ambulatory Visit: Payer: Self-pay

## 2023-08-15 ENCOUNTER — Ambulatory Visit: Payer: Medicaid Other | Admitting: Student

## 2023-08-15 ENCOUNTER — Ambulatory Visit (HOSPITAL_COMMUNITY)
Admission: RE | Admit: 2023-08-15 | Discharge: 2023-08-15 | Disposition: A | Discharge status: Home / Self Care | Payer: Medicaid Other | Source: Ambulatory Visit | Attending: Internal Medicine | Admitting: Internal Medicine

## 2023-08-15 VITALS — BP 132/85 | HR 71 | Temp 97.9°F | Resp 32 | Ht 72.0 in | Wt 298.3 lb

## 2023-08-15 DIAGNOSIS — D509 Iron deficiency anemia, unspecified: Secondary | ICD-10-CM | POA: Diagnosis not present

## 2023-08-15 DIAGNOSIS — E119 Type 2 diabetes mellitus without complications: Secondary | ICD-10-CM | POA: Diagnosis present

## 2023-08-15 DIAGNOSIS — M545 Low back pain, unspecified: Secondary | ICD-10-CM | POA: Diagnosis present

## 2023-08-15 DIAGNOSIS — Z0000 Encounter for general adult medical examination without abnormal findings: Secondary | ICD-10-CM

## 2023-08-15 DIAGNOSIS — Z6841 Body Mass Index (BMI) 40.0 and over, adult: Secondary | ICD-10-CM | POA: Diagnosis not present

## 2023-08-15 LAB — POCT GLYCOSYLATED HEMOGLOBIN (HGB A1C): Hemoglobin A1C: 6.2 % — AB (ref 4.0–5.6)

## 2023-08-15 LAB — GLUCOSE, CAPILLARY: Glucose-Capillary: 83 mg/dL (ref 70–99)

## 2023-08-15 MED ORDER — MOUNJARO 2.5 MG/0.5ML ~~LOC~~ SOAJ: 2.5000 mg | SUBCUTANEOUS | 1 refills | Status: DC

## 2023-08-15 MED ORDER — DULOXETINE HCL 20 MG PO CPEP: 40.0000 mg | ORAL_CAPSULE | Freq: Every day | ORAL | 2 refills | Status: DC

## 2023-08-15 NOTE — Progress Notes (Signed)
CC: DM f/u, back pain   HPI:  Diane Rice is a 37 y.o. female living with a history stated below and presents today for DM f/u and back pain. Please see problem based assessment and plan for additional details.  Past Medical History:  Diagnosis Date   Anemia    Anemia    Asthma    Back pain, chronic    Hypertension    Muscle spasm     Current Outpatient Medications on File Prior to Visit  Medication Sig Dispense Refill   ARIPiprazole (ABILIFY) 5 MG tablet Take 1/2 tab daily for 1 week and than full tab daily 30 tablet 0   ferrous sulfate 325 (65 FE) MG EC tablet Take 1 tablet (325 mg total) by mouth every other day. 15 tablet 11   ibuprofen (ADVIL) 200 MG tablet Take 200 mg by mouth as directed.     lamoTRIgine (LAMICTAL) 25 MG tablet Take 1 tablet (25 mg total) by mouth daily for 14 days, THEN 2 tablets (50 mg total) daily. 60 tablet 1   tranexamic acid (LYSTEDA) 650 MG TABS tablet Take 2 tablets (1,300 mg total) by mouth 3 (three) times daily. Take during menses for a maximum of five days 30 tablet 6   traZODone (DESYREL) 50 MG tablet Take 1 tablet (50 mg total) by mouth at bedtime. 30 tablet 1   [DISCONTINUED] sucralfate (CARAFATE) 1 g tablet Take 1 tablet (1 g total) by mouth 4 (four) times daily as needed. 30 tablet 0   No current facility-administered medications on file prior to visit.    Family History  Problem Relation Age of Onset   Depression Mother    Bipolar disorder Sister     Social History   Socioeconomic History   Marital status: Married    Spouse name: jamal   Number of children: 2   Years of education: Not on file   Highest education level: 9th grade  Occupational History   Not on file  Tobacco Use   Smoking status: Former    Current packs/day: 0.00    Average packs/day: 0.5 packs/day for 13.0 years (6.5 ttl pk-yrs)    Types: Cigarettes    Start date: 06/30/1996    Quit date: 06/30/2009    Years since quitting: 14.1   Smokeless  tobacco: Never  Vaping Use   Vaping status: Never Used  Substance and Sexual Activity   Alcohol use: Yes    Alcohol/week: 1.0 - 2.0 standard drink of alcohol    Types: 1 - 2 Cans of beer per week    Comment: socially   Drug use: No   Sexual activity: Yes    Birth control/protection: None  Other Topics Concern   Not on file  Social History Narrative   Lives with husband and children. Used to work in [employer] but has not been working lately   Social Drivers of Corporate investment banker Strain: High Risk (07/09/2022)   Overall Financial Resource Strain (CARDIA)    Difficulty of Paying Living Expenses: Very hard  Food Insecurity: Food Insecurity Present (01/17/2023)   Hunger Vital Sign    Worried About Running Out of Food in the Last Year: Sometimes true    Ran Out of Food in the Last Year: Sometimes true  Transportation Needs: Unmet Transportation Needs (01/17/2023)   PRAPARE - Administrator, Civil Service (Medical): Yes    Lack of Transportation (Non-Medical): Yes  Physical Activity: Inactive (07/09/2022)  Exercise Vital Sign    Days of Exercise per Week: 0 days    Minutes of Exercise per Session: 0 min  Stress: Stress Concern Present (07/09/2022)   [employer] of Occupational Health - Occupational Stress Questionnaire    Feeling of Stress : Rather much  Social Connections: Moderately Isolated (03/22/2023)   Social Connection and Isolation Panel [NHANES]    Frequency of Communication with Friends and Family: Twice a week    Frequency of Social Gatherings with Friends and Family: Never    Attends Religious Services: More than 4 times per year    Active Member of [employer] or Organizations: No    Attends Banker Meetings: Never    Marital Status: Living with partner  Intimate Partner Violence: Not At Risk (01/17/2023)   Humiliation, Afraid, Rape, and Kick questionnaire    Fear of Current or Ex-Partner: No    Emotionally Abused: No     Physically Abused: No    Sexually Abused: No    Review of Systems: ROS negative except for what is noted on the assessment and plan.  Vitals:   08/15/23 1318 08/15/23 1403  BP: (!) 133/92 132/85  Pulse: 71   Resp: (!) 32   Temp: 97.9 F (36.6 C)   TempSrc: Oral   SpO2: 100%   Weight: 298 lb 4.8 oz (135.3 kg)   Height: 6' (1.829 m)    Physical Exam: Constitutional: well-appearing female sitting in chair, in no acute distress Cardiovascular: regular rate  Pulmonary/Chest: normal work of breathing on room air MSK: lumbar paraspinal tenderness, tightness and muscle spasms, able to ambulate without assistance Neurological: alert & oriented x 3 Psych: normal mood and affect  Assessment & Plan:   Iron deficiency anemia Last iron panel consistent with IDA. Received IV iron infusions x2 in 06/2022. Denies any overt signs of bleeding today. Will recheck labs today.   Plan -Repeat CBC, iron studies    Lumbar back pain Chronic lumbar back pain with spasms. Has been taking duloxetine 60 mg not daily due to side effects of "feeling high". 30 mg did not provide relief. Exam showed paraspinal TTP. She did not complete imaging that was ordered at prior OV. Has been working with PT.   Plan -Decreased duloxetine to 40 mg daily (two 20 mg capsules) -Tylenol PRN -F/u lumbar x-ray   Morbid obesity with BMI of 40.0-44.9, adult (HCC) BMI 40.46. Addressing her diabetes today and starting GLP-1 which will help with weight loss. Patient provided with diet information since she was interested. Sent another referral to see Lupita Leash.   Type 2 diabetes mellitus without complication, without long-term current use of insulin (HCC) Last A1c was 6.6 in 10/2022. Was prescribed metformin 500 mg daily but stopped due to GI side effects. Will start GLP-1 today and can consider SGLT-2 at future visit. A1c today improved to 6.2. Sent another referral to meet with Lupita Leash.   Plan -Start Mounjaro 2.5 mg weekly x 4  weeks then titrate up  -If not approved, switch to Ozempic  -BMP today -Urine microalbumin today  -Referral to Lupita Leash  -F/u in 1 month  Health care maintenance -Declined overdue vaccinations today   Patient discussed with Dr. Docia Furl, D.O. [facility] Internal [facility], PGY-2 Phone: [phone] Date 08/15/2023 Time 6:32 PM

## 2023-08-15 NOTE — Patient Instructions (Signed)
Thank you, [name] for allowing [place] to provide your care today. Today we discussed diabetes and back spasms.    -Start Mounjaro 2.5 mg weekly for 4 weeks and if tolerating will increase to 5 mg weekly injection. Plan to follow up in clinic in 1 month.  -Repeat A1c pending today, will call with results.  -We will recheck blood work and urine sample today.  -Decrease your duloxetine to 40 mg daily.  -Go upstairs to get the x-ray for your lower back.   I have ordered the following labs for you:  Lab Orders         Microalbumin / Creatinine Urine Ratio         BMP8+Anion Gap         Iron and IBC ([id number])         CBC no Diff         Glucose, capillary         POC Hbg A1C       Referrals ordered today:   Referral Orders         Referral to Nutrition and Diabetes Services       I have ordered the following medication/changed the following medications:   Stop the following medications: Medications Discontinued During This Encounter  Medication Reason   metFORMIN (GLUCOPHAGE) 500 MG tablet Change in therapy   DULoxetine (CYMBALTA) 60 MG capsule Change in therapy     Start the following medications: Meds ordered this encounter  Medications   tirzepatide (MOUNJARO) 2.5 MG/0.5ML Pen    Sig: Inject 2.5 mg into the skin once a week. For 4 weeks and if doing well, will increase to 5 mg weekly.    Dispense:  2 mL    Refill:  1   DULoxetine (CYMBALTA) 20 MG capsule    Sig: Take 2 capsules (40 mg total) by mouth daily.    Dispense:  60 capsule    Refill:  2     Follow up:  1 month    Should you have any questions or concerns please call the internal medicine clinic at [phone].    Rana Snare, D.O. [facility]

## 2023-08-15 NOTE — Assessment & Plan Note (Signed)
Last A1c was 6.6 in 10/2022. Was prescribed metformin 500 mg daily but stopped due to GI side effects. Will start GLP-1 today and can consider SGLT-2 at future visit. A1c today improved to 6.2. Sent another referral to meet with Lupita Leash.   Plan -Start Mounjaro 2.5 mg weekly x 4 weeks then titrate up  -If not approved, switch to Ozempic  -BMP today -Urine microalbumin today  -Referral to Lupita Leash  -F/u in 1 month

## 2023-08-15 NOTE — Assessment & Plan Note (Signed)
-  Declined overdue vaccinations today

## 2023-08-15 NOTE — Assessment & Plan Note (Signed)
Last iron panel consistent with IDA. Received IV iron infusions x2 in 06/2022. Denies any overt signs of bleeding today. Will recheck labs today.   Plan -Repeat CBC, iron studies

## 2023-08-15 NOTE — Assessment & Plan Note (Signed)
BMI 40.46. Addressing her diabetes today and starting GLP-1 which will help with weight loss. Patient provided with diet information since she was interested. Sent another referral to see Lupita Leash.

## 2023-08-15 NOTE — Assessment & Plan Note (Signed)
Chronic lumbar back pain with spasms. Has been taking duloxetine 60 mg not daily due to side effects of "feeling high". 30 mg did not provide relief. Exam showed paraspinal TTP. She did not complete imaging that was ordered at prior OV. Has been working with PT.   Plan -Decreased duloxetine to 40 mg daily (two 20 mg capsules) -Tylenol PRN -F/u lumbar x-ray

## 2023-08-16 ENCOUNTER — Telehealth: Payer: Self-pay | Admitting: *Deleted

## 2023-08-16 ENCOUNTER — Telehealth: Payer: Self-pay

## 2023-08-16 ENCOUNTER — Encounter: Payer: Self-pay | Admitting: Student

## 2023-08-16 LAB — CBC
Hematocrit: 25.7 % — ABNORMAL LOW (ref 34.0–46.6)
Hemoglobin: 7.4 g/dL — ABNORMAL LOW (ref 11.1–15.9)
MCH: 20.9 pg — ABNORMAL LOW (ref 26.6–33.0)
MCHC: 28.8 g/dL — ABNORMAL LOW (ref 31.5–35.7)
MCV: 73 fL — ABNORMAL LOW (ref 79–97)
Platelets: 413 10*3/uL (ref 150–450)
RBC: 3.54 x10E6/uL — ABNORMAL LOW (ref 3.77–5.28)
RDW: 18.8 % — ABNORMAL HIGH (ref 11.7–15.4)
WBC: 6.9 10*3/uL (ref 3.4–10.8)

## 2023-08-16 LAB — BMP8+ANION GAP
Anion Gap: 15 mmol/L (ref 10.0–18.0)
BUN/Creatinine Ratio: 15 (ref 9–23)
BUN: 11 mg/dL (ref 6–20)
CO2: 22 mmol/L (ref 20–29)
Calcium: 9.2 mg/dL (ref 8.7–10.2)
Chloride: 103 mmol/L (ref 96–106)
Creatinine, Ser: 0.73 mg/dL (ref 0.57–1.00)
Glucose: 82 mg/dL (ref 70–99)
Potassium: 4.1 mmol/L (ref 3.5–5.2)
Sodium: 140 mmol/L (ref 134–144)
eGFR: 109 mL/min/{1.73_m2} (ref >59)

## 2023-08-16 LAB — IRON AND TIBC
Iron Saturation: 4 % — CL (ref 15–55)
Iron: 20 ug/dL — ABNORMAL LOW (ref 27–159)
Total Iron Binding Capacity: 476 ug/dL — ABNORMAL HIGH (ref 250–450)
UIBC: 456 ug/dL — ABNORMAL HIGH (ref 131–425)

## 2023-08-16 LAB — MICROALBUMIN / CREATININE URINE RATIO
Creatinine, Urine: 78.6 mg/dL
Microalb/Creat Ratio: 17 mg/g{creat} (ref 0–29)
Microalbumin, Urine: 13.4 ug/mL

## 2023-08-16 MED ORDER — SEMAGLUTIDE(0.25 OR 0.5MG/DOS) 2 MG/3ML ~~LOC~~ SOPN: 0.2500 mg | PEN_INJECTOR | SUBCUTANEOUS | 1 refills | Status: DC

## 2023-08-16 MED ORDER — DICLOFENAC SODIUM 1 % EX GEL: 2.0000 g | Freq: Four times a day (QID) | CUTANEOUS | 5 refills | Status: DC

## 2023-08-16 NOTE — Addendum Note (Signed)
Addended by: Rana Snare on: 08/16/2023 10:09 AM   Modules accepted: Orders

## 2023-08-16 NOTE — Telephone Encounter (Signed)
Prior Authorization for patient (Ozempic (0.25 or 0.5 MG/DOSE) 2MG /3ML pen-injectors) came through on cover my meds was submitted with last office notes and labs awaiting approval or denial.  WUJ:[id number]

## 2023-08-16 NOTE — Addendum Note (Signed)
Addended by: Rana Snare on: 08/16/2023 10:34 AM   Modules accepted: Orders

## 2023-08-16 NOTE — Telephone Encounter (Signed)
Prior Authorization for patient Diane Rice 2.5MG /0.5ML auto-injectors) came through on cover my meds was submitted with last office notes and labs awaiting approval or denial.  KEY: [id number]

## 2023-08-16 NOTE — Congregational Nurse Program (Signed)
  Dept: [phone]   Congregational Nurse Program Note  Date of Encounter: 08/16/2023  Clinic visit to ask questions regarding laboratory results from physician visit on 12/16.  Has HGB of 7.4 and HCT of 25, explained the normal levels. Called MD office, spoke with nurse Venita Sheffield, MD is scheduling iron infusion, will call resident this afternoon to discuss and arrange appointment.    BP 137/89, pulse 70 and regular, O2 Sat 98%.  A1c on 12/16 was 6.2, explained normal blood glucose levels and relationship to A1c.   Past Medical History: Past Medical History:  Diagnosis Date   Anemia    Anemia    Asthma    Back pain, chronic    Hypertension    Muscle spasm     Encounter Details:

## 2023-08-16 NOTE — Telephone Encounter (Signed)
Decision:Denied  YOU ASKED FOR: Service Description Code 1 Code 2 Plan Requested  Dates Requested  Amount Diane Rice Auto-inj [id number] Medicaid 08/15/2023 2 units WE DENIED: Service Description Code 1 Code 2 Plan Denied Dates Denied  Amount Diane Rice Auto-inj [id number] Medicaid 08/15/2023 2 units COMMENTS:  Per the health plan preferred drug list, at least 2 preferred drugs must be tried before requesting this drug  or tell us why the member cannot try any preferred alternatives. Please send [place] supporting chart notes and  lab results. Here is list of preferred alternatives:  Byetta Pen Trulicity Pen Victoza Pen Ozempic Pen Note: Some preferred drug(s) may have quantity limits. Refer to the health plan's preferred drug list for  additional details. Additionally, per the health plan guideline, GLP-1 Receptor Agonists and Combination, the clinical criteria  below must be met before this request can be approved. Please send [place] supporting chart notes and lab  results. 1. for non-preferred products, the beneficiary must have tried and failed or experienced an insufficient  response to at least two preferred products or have a clinical reason that preferred products cannot be tried

## 2023-08-16 NOTE — Addendum Note (Signed)
Addended by: Rana Snare on: 08/16/2023 04:36 PM   Modules accepted: Orders

## 2023-08-16 NOTE — Telephone Encounter (Signed)
Call from Congregational Nurse Diane Rice who is with patient now.  Diane Rice was asking for patient if she can have patient take some FeS04 OTC until she can be seen for a follow up appointment for the labs in the Clinics.  Patient stated that she has not received a call about her results.  Message in chart and relayed to patient and Diane Rice that Dr. Sherrilee Gilles has plans to call patient today and to discuss her lab results and the plan for follow up.  Diane Rice would also like to assist patient with her Diabetes and Nutrition Management as well.  Diane Rice when told that there was to be a Nutritional  Consult made stated that she would be glad to assist and to help with the Nutrition and Diabetes

## 2023-08-17 ENCOUNTER — Telehealth: Payer: Self-pay

## 2023-08-17 NOTE — Telephone Encounter (Signed)
Patient referred to infusion pharmacy team for ambulatory infusion of IV iron.  Insurance - [place] Medicaid  Dx code - D50.9 IV Iron Therapy - Feraheme 510 mg IV x 2   Infusion appointments - Scheduling team will schedule patient as soon as possible.    Demetrius Charity, PharmD

## 2023-08-17 NOTE — Telephone Encounter (Signed)
Patient referred to infusion pharmacy team for ambulatory infusion of IV iron.  Insurance - [place] Medicaid [employer]  Dx code - D50.9 IV Iron Therapy - Feraheme 510 mg IV x 2  Infusion appointments - Scheduling team will schedule patient as soon as possible.    Demetrius Charity, PharmD

## 2023-08-17 NOTE — Telephone Encounter (Signed)
Decision:Approved  Diane Rice (Key: [id number]) Ozempic (0.25 or 0.5 MG/DOSE) 2MG /3ML pen-injectors Form [employer] of [employer] Prior Authorization Request Form [phone] NCPDP) Created Message from Plan Approved. This drug has been approved. Approved quantity: 3 milliliters per 30 day(s). You may fill up to a 34 day supply at a retail pharmacy. You may fill up to a 90 day supply for maintenance drugs, please refer to the formulary for details. Please call the pharmacy to process your prescription claim.. Authorization Expiration Date: August 15, 2024.

## 2023-08-18 LAB — FERRITIN: Ferritin: 8 ng/mL — ABNORMAL LOW (ref 15–150)

## 2023-08-18 LAB — SPECIMEN STATUS REPORT

## 2023-08-22 NOTE — Progress Notes (Signed)
Internal Medicine Clinic Attending  Case discussed with the resident at the time of the visit.  We reviewed the resident's history and exam and pertinent patient test results.  I agree with the assessment, diagnosis, and plan of care documented in the resident's note.  

## 2023-08-29 ENCOUNTER — Ambulatory Visit (HOSPITAL_COMMUNITY): Payer: Medicaid Other | Admitting: Mental Health

## 2023-08-29 DIAGNOSIS — F39 Unspecified mood [affective] disorder: Secondary | ICD-10-CM

## 2023-08-29 DIAGNOSIS — F29 Unspecified psychosis not due to a substance or known physiological condition: Secondary | ICD-10-CM

## 2023-08-29 DIAGNOSIS — F4310 Post-traumatic stress disorder, unspecified: Secondary | ICD-10-CM | POA: Diagnosis not present

## 2023-08-29 NOTE — Progress Notes (Signed)
THERAPIST PROGRESS NOTE Virtual Visit via Video Note  I connected with Chrishell Kennison on 08/29/2023 at  3:00 PM EST by a video enabled telemedicine application and verified that I am speaking with the correct person using two identifiers.  Location: Patient: home address on file Provider: home office   I discussed the limitations of evaluation and management by telemedicine and the availability of in person appointments. The patient expressed understanding and agreed to proceed.  I discussed the assessment and treatment plan with the patient. The patient was provided an opportunity to ask questions and all were answered. The patient agreed with the plan and demonstrated an understanding of the instructions.   The patient was advised to call back or seek an in-person evaluation if the symptoms worsen or if the condition fails to improve as anticipated.  I provided 55 minutes of non-face-to-face time during this encounter.   Dorris Singh, [facility]   Session Time: 3:02 pm(55 minutes)  Participation Level: Active  Behavioral Response: CasualAlert/Stable  Type of Therapy: Individual Therapy  Treatment Goals addressed: STG: "Manage my moods." Tabiya (Cambrey) will increase management of moods AEB daily medication compliance with development of x 3 effective emotional regulation skills with ability to process thoughts and feelings in healthy balanced manner per self report within the next 90 days.   ProgressTowards Goals: Progressing  Interventions: CBT and Supportive  Summary:  Maikou Molina is a 37 y.o. female who presents with dx of Unspecified mood disorder rule out schizoaffective bipolar type vs. Bipolar disorder with psychotic features, PTSD and alcohol use disorder moderate. Janyce, who prefers to go by [place], presents for session alert and oriented; mood and affect blunted, low. Shares to have started to work at [employer] for the past x 2 months and shares  for this to not be going well. Notes continues to have the same issues as previous positions in which she is attempting to do the right thing however others are not and thus getting on her about it. 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 Notes incident of telling somene to to do something and they denied to do so, noting her to not be management. Shares to feel as if they are purposefully trying to hold her back. Explored possibility of looking for employment where she will be able to work independently. Shares inconsistent medicatin compliance and to have missed last medication appointment. Unclear if medications are working well for her, noting ongoing irritabiity with moods, sleep difficulties, feelings of paranoia, racing thoughts and episodes of increased speech. Shares for husband to have noted period sof time in which she is "doing too much." Reports to feel as if others are talking about her when it comes to church members, family and general public. Shares stressors of x 4 children and shares to not get much time with husband as he works x 2 positions with concern for inability to maintain employment. Denies ability to engage in self-care and with being overly stimulated and can last out as a result. Agreeable to referral to supported employment. Agrees to sign release for therapist contact with husband for further information and assessment into sxs present. Agrees to contact front desk to reschedule MM appointment or need for walk in. Minimal progress with goals; not medication compliant. Working to incorporate self-care.   Suicidal/Homicidal: Nowithout intent/plan  Therapist Response:  Therapist engaged pt in tele-therapy session. Completed check in  and assessed for current level of functioning sxs management and level of stressors.  Active empathic listening reviewing current concerns and ability to manage and regulate moods. Provided  supportive feedback; validated feelings. Assessed for current sxs and medication compliance, educated on importance of medication adherence and maintaining medication management appointments. Explored sxs and presence of paranoia and possible elevated moods. Processed engagement in coping skills and ability to take self-care breaks and being meaningful about self-care. Provided support with work stressors and ability to find balance between holding true to morals vs. Operating out of her role. Educated on supported employment and gained verbal consent to submit needed referral information, completed in session. Encouraged contacting front desk to reschedule appointment and encouraged focusing on only her work vs others and engagement in self-care daily and allowing some time to self soothe when distressed.   Plan: Return again in  x 6 weeks.  Diagnosis:  Unspecified mood disorder  unspecified Psychosis  Collaboration of Care: Other None  Patient/Guardian was advised Release of Information must be obtained prior to any record release in order to collaborate their care with an outside provider. Patient/Guardian was advised if they have not already done so to contact the registration department to sign all necessary forms in order for [place] to release information regarding their care.   Consent: Patient/Guardian gives verbal consent for treatment and assignment of benefits for services provided during this visit. Patient/Guardian expressed understanding and agreed to proceed.   Stephan Minister [place], [facility] 08/29/2023

## 2023-09-08 ENCOUNTER — Telehealth: Payer: Self-pay | Admitting: Dietician

## 2023-09-08 ENCOUNTER — Encounter: Payer: Self-pay | Admitting: Student

## 2023-09-08 ENCOUNTER — Ambulatory Visit (INDEPENDENT_AMBULATORY_CARE_PROVIDER_SITE_OTHER): Payer: Medicaid Other | Admitting: Dietician

## 2023-09-08 ENCOUNTER — Ambulatory Visit: Payer: Medicaid Other | Admitting: Student

## 2023-09-08 ENCOUNTER — Other Ambulatory Visit: Payer: Self-pay

## 2023-09-08 VITALS — BP 123/65 | HR 82 | Temp 97.9°F | Ht 72.0 in | Wt 282.9 lb

## 2023-09-08 DIAGNOSIS — D509 Iron deficiency anemia, unspecified: Secondary | ICD-10-CM | POA: Diagnosis not present

## 2023-09-08 DIAGNOSIS — M545 Low back pain, unspecified: Secondary | ICD-10-CM

## 2023-09-08 DIAGNOSIS — E119 Type 2 diabetes mellitus without complications: Secondary | ICD-10-CM

## 2023-09-08 DIAGNOSIS — R0981 Nasal congestion: Secondary | ICD-10-CM

## 2023-09-08 DIAGNOSIS — E559 Vitamin D deficiency, unspecified: Secondary | ICD-10-CM

## 2023-09-08 DIAGNOSIS — Z7985 Long-term (current) use of injectable non-insulin antidiabetic drugs: Secondary | ICD-10-CM | POA: Diagnosis not present

## 2023-09-08 MED ORDER — GUAIFENESIN-DM 100-10 MG/5ML PO SYRP: 5.0000 mL | ORAL_SOLUTION | ORAL | 0 refills | Status: DC | PRN

## 2023-09-08 MED ORDER — SEMAGLUTIDE(0.25 OR 0.5MG/DOS) 2 MG/3ML ~~LOC~~ SOPN: 0.2500 mg | PEN_INJECTOR | SUBCUTANEOUS | 1 refills | Status: DC

## 2023-09-08 MED ORDER — FLUTICASONE PROPIONATE 50 MCG/ACT NA SUSP: 1.0000 | Freq: Every day | NASAL | 3 refills | Status: AC

## 2023-09-08 MED ORDER — DICLOFENAC SODIUM 1 % EX GEL: 2.0000 g | Freq: Four times a day (QID) | CUTANEOUS | 5 refills | Status: DC

## 2023-09-08 NOTE — Assessment & Plan Note (Addendum)
 Reports URI symptoms that started 1 week ago. Endorses nasal and chest congestion, productive cough, nausea, body aches, sore throat. Denies fever or chills, vomiting. Around sick contacts recently tested positive for flu. She is HDS and afebrile. Cardiopulmonary exam stable. Negative swab for COVID and influenza. Likely some viral etiology and will improve over time. Symptomatic mgmt sent to pharmacy.   Plan -Start Flonase  daily -Start Robitussin DM Q4-6H PRN for cough  -Tylenol  PRN for aches/pains  -Recommend neti pot, humidifier and saline irrigation  -Return precautions discussed

## 2023-09-08 NOTE — Telephone Encounter (Signed)
 Has not started semaglutide because it went to a pharmacy they no longer use. Request it be sent to [facility] on [address]

## 2023-09-08 NOTE — Progress Notes (Signed)
 CC: 1 month f/u, c/f flu  HPI:  [name] is a 38 y.o. female living with a history stated below and presents today for 1 mouth f/u and c/f flu. Please see problem based assessment and plan for additional details.  Past Medical History:  Diagnosis Date   Anemia    Anemia    Asthma    Back pain, chronic    Hypertension    Muscle spasm     Current Outpatient Medications on File Prior to Visit  Medication Sig Dispense Refill   ARIPiprazole  (ABILIFY ) 5 MG tablet Take 1/2 tab daily for 1 week and than full tab daily 30 tablet 0   DULoxetine  (CYMBALTA ) 20 MG capsule Take 2 capsules (40 mg total) by mouth daily. 60 capsule 2   ferrous sulfate  325 (65 FE) MG EC tablet Take 1 tablet (325 mg total) by mouth every other day. 15 tablet 11   lamoTRIgine  (LAMICTAL ) 25 MG tablet Take 1 tablet (25 mg total) by mouth daily for 14 days, THEN 2 tablets (50 mg total) daily. 60 tablet 1   tranexamic acid  (LYSTEDA ) 650 MG TABS tablet Take 2 tablets (1,300 mg total) by mouth 3 (three) times daily. Take during menses for a maximum of five days 30 tablet 6   traZODone  (DESYREL ) 50 MG tablet Take 1 tablet (50 mg total) by mouth at bedtime. 30 tablet 1   No current facility-administered medications on file prior to visit.    Family History  Problem Relation Age of Onset   Depression Mother    Bipolar disorder Sister     Social History   Socioeconomic History   Marital status: Married    Spouse name: jamal   Number of children: 2   Years of education: Not on file   Highest education level: 9th grade  Occupational History   Not on file  Tobacco Use   Smoking status: Former    Current packs/day: 0.00    Average packs/day: 0.5 packs/day for 13.0 years (6.5 ttl pk-yrs)    Types: Cigarettes    Start date: 06/30/1996    Quit date: 06/30/2009    Years since quitting: 14.2   Smokeless tobacco: Never  Vaping Use   Vaping status: Never Used  Substance and Sexual Activity   Alcohol  use: Yes    Alcohol/week: 1.0 - 2.0 standard drink of alcohol    Types: 1 - 2 Cans of beer per week    Comment: socially   Drug use: No   Sexual activity: Yes    Birth control/protection: None  Other Topics Concern   Not on file  Social History Narrative   Lives with husband and children. Used to work in [employer] but has not been working lately   Social Drivers of Corporate Investment Banker Strain: High Risk (07/09/2022)   Overall Financial Resource Strain (CARDIA)    Difficulty of Paying Living Expenses: Very hard  Food Insecurity: Food Insecurity Present (01/17/2023)   Hunger Vital Sign    Worried About Running Out of Food in the Last Year: Sometimes true    Ran Out of Food in the Last Year: Sometimes true  Transportation Needs: Unmet Transportation Needs (01/17/2023)   PRAPARE - Administrator, Civil Service (Medical): Yes    Lack of Transportation (Non-Medical): Yes  Physical Activity: Inactive (07/09/2022)   Exercise Vital Sign    Days of Exercise per Week: 0 days    Minutes of Exercise  per Session: 0 min  Stress: Stress Concern Present (07/09/2022)   [employer] of Occupational Health - Occupational Stress Questionnaire    Feeling of Stress : Rather much  Social Connections: Moderately Isolated (03/22/2023)   Social Connection and Isolation Panel [NHANES]    Frequency of Communication with Friends and Family: Twice a week    Frequency of Social Gatherings with Friends and Family: Never    Attends Religious Services: More than 4 times per year    Active Member of [employer] or Organizations: No    Attends Banker Meetings: Never    Marital Status: Living with partner  Intimate Partner Violence: Not At Risk (01/17/2023)   Humiliation, Afraid, Rape, and Kick questionnaire    Fear of Current or Ex-Partner: No    Emotionally Abused: No    Physically Abused: No    Sexually Abused: No    Review of Systems: ROS negative except for what is  noted on the assessment and plan.  Vitals:   09/08/23 1524  BP: 123/65  Pulse: 82  Temp: 97.9 F (36.6 C)  TempSrc: Oral  SpO2: 100%  Weight: 282 lb 14.4 oz (128.3 kg)  Height: 6' (1.829 m)   Physical Exam: Constitutional: alert, female sitting in chair, in no acute distress HENT: mucous membranes moist, nasal congestion  Cardiovascular: regular rate and rhythm Pulmonary/Chest: normal work of breathing on room air, lungs clear to auscultation bilaterally, no wheezing or crackles Neurological: alert & oriented x 3 Psych: pleasant mood at times anxious, denies SI/HI  Assessment & Plan:   Nasal congestion Reports URI symptoms that started 1 week ago. Endorses nasal and chest congestion, productive cough, nausea, body aches, sore throat. Denies fever or chills, vomiting. Around sick contacts recently tested positive for flu. She is HDS and afebrile. Cardiopulmonary exam stable. Negative swab for COVID and influenza. Likely some viral etiology and will improve over time. Symptomatic mgmt sent to pharmacy.   Plan -Start Flonase  daily -Start Robitussin DM Q4-6H PRN for cough  -Tylenol  PRN for aches/pains  -Recommend neti pot, humidifier and saline irrigation  -Return precautions discussed   Type 2 diabetes mellitus without complication, without long-term current use of insulin (HCC) Last A1c 6.2 in December. Not approved for Mounjaro  and Ozempic  was sent in last OV as preferred. Has not picked up Ozempic  from pharmacy. Encourage patient to pick it up once ready and start. Patient aware weekly injections and plan to titrate up if tolerated.   Plan -Start Ozempic  0.25 mg weekly x 4 weeks then titrate up -F/u in 2 months  Lumbar back pain Taking duloxetine  40 mg daily with some relief which she states is better than 30 mg and not as strong with side effects as 60 mg. Discussed lumbar xray results of facet hypertrophy. Of note, she has an ordered MRI lumbar placed at prior OV that has  not been completed.   Plan -Continue duloxetine  40 mg daily  -Tylenol  PRN  Iron deficiency anemia Still IDA on recent labs. Has been set up for outpatient IV iron infusions. Patient aware and given info to call infusion center to schedule all dates/times (they have also messaged her via MyChart).   Patient discussed with Dr. Machen  Evaristo Tsuda, D.O. [facility] Internal Medicine, PGY-2 Phone: [phone] Date 09/08/2023 Time 4:26 PM

## 2023-09-08 NOTE — Patient Instructions (Addendum)
 Starting a GLP-1 RA Medication  Keep in the refrigerator most of the time. (Can stay at room temperature for only 21 days)  Rotate injection sites.  Food Portions: Eat smaller portions than usual. Consider using a smaller plate or bowl. Eat more slowly. Stop eating at the first sign of fullness.   Meal Timing: Eat regularly, don't skip meals. Do not rely on hunger as a signal to eat.  Nutrient Quality: Choose and eat healthy foods Eat protein with each meal and snack. Consider eating protein first. Limit high fat, greasy foods. Limit spicy foods.  Decrease Nausea, Vomiting: On injection day, have largest meal before taking the medication. Eat small, frequent low fat meals Drink enough fluids, at leas 64 ounces/day. Best if not carbonated and do not contain caffeine  Constipation: Assure adequate fiber. ( 25-39 grams/day) Eat a variety of vegetables, fruits, whole grains and beans Increase physical activity

## 2023-09-08 NOTE — Patient Instructions (Addendum)
 Thank you, Ms.[name] for allowing us  to provide your care today. Today we discussed   -I am sorry to hear you have flu-like symptoms, hope you start feeling better soon. It will take time for the symptoms to fully go away.  -Checking for COVID and Flu today -Flonase  daily into each nostril for congestion -Robitussin-DM for cough as needed  -Resent Voltaren  gel to pharmacy   -Start your Ozempic  when it is ready for pickup    -Make sure to call and schedule your IV iron infusions for your low iron  [facility] [address] [place] [place], [place] [address] [phone]   I have ordered the following labs for you: Lab Orders         Coronavirus (COVID-19) with Influenza A and Influenza B         Vitamin D (25 hydroxy)      I have ordered the following medication/changed the following medications:   Stop the following medications: Medications Discontinued During This Encounter  Medication Reason   diclofenac  Sodium (VOLTAREN ) 1 % GEL Reorder     Start the following medications: Meds ordered this encounter  Medications   fluticasone  (FLONASE ) 50 MCG/ACT nasal spray    Sig: Place 1 spray into both nostrils daily.    Dispense:  64 mL    Refill:  3   guaiFENesin -dextromethorphan (ROBITUSSIN DM) 100-10 MG/5ML syrup    Sig: Take 5 mLs by mouth every 4 (four) hours as needed for cough.    Dispense:  118 mL    Refill:  0   diclofenac  Sodium (VOLTAREN ) 1 % GEL    Sig: Apply 2 g topically 4 (four) times daily.    Dispense:  100 g    Refill:  5     Follow up: 2 months   Should you have any questions or concerns please call the internal medicine clinic at [phone].    Leyland Kenna, D.O. [facility]

## 2023-09-08 NOTE — Progress Notes (Signed)
 Diabetes Self-Management Education  Visit Type: First/Initial  Appt. Start Time: 1420 Appt. End Time: 1520  09/08/2023  [name], identified by name and date of birth, is a 38 y.o. female with a diagnosis of Diabetes: Type 2.   ASSESSMENT  She did not want to weigh today. Had not picked up semaglutide  as she thought it was not covered. Call to [facility] who was going to get it ready as it was covered. Patient then asked the prescription to be sent to [facility] on [address] because [facility] not get [facility] and have not used it in a while.  Estimated body mass index is 38.37 kg/m as calculated from the following:   Height as of an earlier encounter on 09/08/23: 6' (1.829 m).   Weight as of an earlier encounter on 09/08/23: 282 lb 14.4 oz (128.3 kg).  Wt Readings from Last 10 Encounters:  09/08/23 282 lb 14.4 oz (128.3 kg)  08/15/23 298 lb 4.8 oz (135.3 kg)  02/09/23 295 lb 14.4 oz (134.2 kg)  01/05/23 289 lb 9.6 oz (131.4 kg)  12/08/22 286 lb (129.7 kg)  11/18/22 296 lb 1.6 oz (134.3 kg)  07/23/22 285 lb (129.3 kg)  07/16/22 286 lb 12.8 oz (130.1 kg)  07/14/22 286 lb 12.8 oz (130.1 kg)  07/09/22 292 lb 4.8 oz (132.6 kg)      Diabetes Self-Management Education - 09/08/23 1500       Visit Information   Visit Type First/Initial      Initial Visit   Diabetes Type Type 2    Date Diagnosed not clear    Are you currently following a meal plan? Yes    What type of meal plan do you follow? healthy, keto/low carb and has used fasting    Are you taking your medications as prescribed? No   she did not think her semaglutide  was approved     Health Coping   How would you rate your overall health? Good      Psychosocial Assessment   Patient Belief/Attitude about Diabetes Motivated to manage diabetes    What is the hardest part about your diabetes right now, causing you the most concern, or is the most worrisome to you about your diabetes?   Getting support / problem solving     Self-care barriers Lack of material resources;Lack of transportation    Self-management support Family    Other persons present Spouse/SO;Other (comment)   RD Intern   Patient Concerns Nutrition/Meal planning;Weight Control;Healthy Lifestyle;Medication    Special Needs None    Preferred Learning Style Hands on;No preference indicated    Learning Readiness Ready    How often do you need to have someone help you when you read instructions, pamphlets, or other written materials from your doctor or pharmacy? 1 - Never    What is the last grade level you completed in school? 12      Pre-Education Assessment   Patient understands the diabetes disease and treatment process. Needs Instruction    Patient understands incorporating nutritional management into lifestyle. Needs Instruction    Patient undertands incorporating physical activity into lifestyle. --   need to assess   Patient understands using medications safely. Needs Instruction    Patient understands monitoring blood glucose, interpreting and using results Comprehends key points    Patient understands prevention, detection, and treatment of acute complications. Needs Instruction    Patient understands prevention, detection, and treatment of chronic complications. Compreheands key points  Patient understands how to develop strategies to address psychosocial issues. Comprehends key points    Patient understands how to develop strategies to promote health/change behavior. Demonstrates understanding / competency      Complications   Last HgB A1C per patient/outside source 6.2 %    How often do you check your blood sugar? Not recommended by provider    Number of hypoglycemic episodes per month 0    Number of hyperglycemic episodes ( >200mg /dL): Rare    Have you had a dilated eye exam in the past 12 months? No    Have you had a dental exam in the past 12 months? No    Are you checking your feet? No      Dietary Intake   Breakfast need  to assess at next visit    Dinner likes chciken, tuna, tofu, green beansm mushrooms, onions, bell peppers, collards, cauliflower, broccoli and spinach      Activity / Exercise   Activity / Exercise Type ADL's;Light (walking / raking leaves)      Patient Education   Previous Diabetes Education No    Disease Pathophysiology Definition of diabetes, type 1 and 2, and the diagnosis of diabetes;Factors that contribute to the development of diabetes;Explored patient's options for treatment of their diabetes    Healthy Eating Plate Method;Role of diet in the treatment of diabetes and the relationship between the three main macronutrients and blood glucose level    Medications Taught/reviewed insulin/injectables, injection, site rotation, insulin/injectables storage and needle disposal.;Reviewed patients medication for diabetes, action, purpose, timing of dose and side effects.   taught her how to use her semagluide and she gave herself and injection of saline     Individualized Goals (developed by patient)   Nutrition General guidelines for healthy choices and portions discussed    Medications take my medication as prescribed      Post-Education Assessment   Patient understands the diabetes disease and treatment process. Comprehends key points    Patient understands incorporating nutritional management into lifestyle. Comprehends key points    Patient understands using medications safely. Comphrehends key points      Outcomes   Expected Outcomes Demonstrated interest in learning. Expect positive outcomes    Future DMSE 2 wks    Program Status Not Completed             Individualized Plan for Diabetes Self-Management Training:   Learning Objective:  Patient will have a greater understanding of diabetes self-management. Patient education plan is to attend individual and/or group sessions per assessed needs and concerns.   Plan:   Patient Instructions  Starting a GLP-1 RA  Medication  Keep in the refrigerator most of the time. (Can stay at room temperature for only 21 days)  Rotate injection sites.  Food Portions: Eat smaller portions than usual. Consider using a smaller plate or bowl. Eat more slowly. Stop eating at the first sign of fullness.   Meal Timing: Eat regularly, don't skip meals. Do not rely on hunger as a signal to eat.  Nutrient Quality: Choose and eat healthy foods Eat protein with each meal and snack. Consider eating protein first. Limit high fat, greasy foods. Limit spicy foods.  Decrease Nausea, Vomiting: On injection day, have largest meal before taking the medication. Eat small, frequent low fat meals Drink enough fluids, at leas 64 ounces/day. Best if not carbonated and do not contain caffeine  Constipation: Assure adequate fiber. ( 25-39 grams/day) Eat a variety of vegetables, fruits, whole grains  and beans Increase physical activity   Expected Outcomes:  Demonstrated interest in learning. Expect positive outcomes  Education material provided: My Plate and Diabetes Resources  If problems or questions, patient to contact team via:  Phone  Future DSME appointment: 2 wks- 3 wks Arland Hole, RD 09/08/2023 3:55 PM.

## 2023-09-09 ENCOUNTER — Encounter: Payer: Self-pay | Admitting: Student

## 2023-09-09 LAB — COVID-19, FLU A+B NAA
Influenza A, NAA: NOT DETECTED
Influenza B, NAA: NOT DETECTED
SARS-CoV-2, NAA: NOT DETECTED

## 2023-09-09 NOTE — Assessment & Plan Note (Signed)
 Still IDA on recent labs. Has been set up for outpatient IV iron infusions. Patient aware and given info to call infusion center to schedule all dates/times (they have also messaged her via MyChart).

## 2023-09-09 NOTE — Assessment & Plan Note (Signed)
 Taking duloxetine  40 mg daily with some relief which she states is better than 30 mg and not as strong with side effects as 60 mg. Discussed lumbar xray results of facet hypertrophy. Of note, she has an ordered MRI lumbar placed at prior OV that has not been completed.   Plan -Continue duloxetine  40 mg daily  -Tylenol  PRN

## 2023-09-09 NOTE — Assessment & Plan Note (Signed)
 Last A1c 6.2 in December. Not approved for Mounjaro  and Ozempic  was sent in last OV as preferred. Has not picked up Ozempic  from pharmacy. Encourage patient to pick it up once ready and start. Patient aware weekly injections and plan to titrate up if tolerated.   Plan -Start Ozempic  0.25 mg weekly x 4 weeks then titrate up -F/u in 2 months

## 2023-09-13 ENCOUNTER — Encounter: Payer: Medicaid Other | Admitting: Student

## 2023-09-15 ENCOUNTER — Encounter: Payer: Medicaid Other | Admitting: Student

## 2023-09-15 ENCOUNTER — Ambulatory Visit: Payer: Medicaid Other | Admitting: Obstetrics and Gynecology

## 2023-09-15 ENCOUNTER — Encounter: Payer: Self-pay | Admitting: Obstetrics and Gynecology

## 2023-09-15 ENCOUNTER — Other Ambulatory Visit (HOSPITAL_COMMUNITY)
Admission: RE | Admit: 2023-09-15 | Discharge: 2023-09-15 | Disposition: A | Discharge status: Home / Self Care | Payer: Medicaid Other | Source: Ambulatory Visit | Attending: Obstetrics and Gynecology | Admitting: Obstetrics and Gynecology

## 2023-09-15 VITALS — BP 133/86 | HR 83 | Wt 292.0 lb

## 2023-09-15 DIAGNOSIS — Z3202 Encounter for pregnancy test, result negative: Secondary | ICD-10-CM

## 2023-09-15 DIAGNOSIS — Z3169 Encounter for other general counseling and advice on procreation: Secondary | ICD-10-CM | POA: Diagnosis not present

## 2023-09-15 DIAGNOSIS — N898 Other specified noninflammatory disorders of vagina: Secondary | ICD-10-CM

## 2023-09-15 DIAGNOSIS — D069 Carcinoma in situ of cervix, unspecified: Secondary | ICD-10-CM | POA: Insufficient documentation

## 2023-09-15 LAB — POCT PREGNANCY, URINE: Preg Test, Ur: NEGATIVE

## 2023-09-15 NOTE — Progress Notes (Signed)
    GYNECOLOGY VISIT  Patient name: Quanna Schollmeyer MRN [id number]  Date of birth: 1986/07/12 Chief Complaint:   Gynecologic Exam  History:  Tenessa Brenton is a 38 y.o. G2P2002 being seen today for post-LEEP follow up.  Havingf a pain inside of her vagina and having pain when she tries to walk fast.  Menses have been heavy and painful as they have been before. Taking iburpofen and the TXA are helping with menses (7 days of bleeding instead of 4 but lighter than they were before)  Hasn't been in PFPT; has been doing her own intermittent stretches at home. Has had issues with getting to PT as she felt it wasn't helping and it was a constant trying different therapies. Now having hip soreness despite the therapies. Has been considering returning and would accept a new referral.   Havign some sharp pain and intermittent vaginal discharge and desire for pregnancy.   Past Medical History:  Diagnosis Date   Anemia    Anemia    Asthma    Back pain, chronic    Hypertension    Muscle spasm     Past Surgical History:  Procedure Laterality Date   MOLE REMOVAL     WISDOM TOOTH EXTRACTION      The following portions of the patient's history were reviewed and updated as appropriate: allergies, current medications, past family history, past medical history, past social history, past surgical history and problem list.   Health Maintenance:   Last pap  07/14/2022 HSIL, HPV 16 12/08/2022 EMB with benign late secretory type endometrium 01/11/2023 LEEP w/ CIN 3, dysplasia present at endo and ectocervical margin, deep stroma margin free, cervicitis, benign ECC  Last mammogram: n/a   Review of Systems:  Pertinent items are noted in HPI. Comprehensive review of systems was otherwise negative.   Objective:  Physical Exam BP 133/86   Pulse 83   Wt 292 lb (132.5 kg)   LMP 08/30/2023 (Exact Date)   BMI 39.60 kg/m    Physical Exam Vitals and nursing note reviewed. Exam conducted with a  chaperone present.  Constitutional:      Appearance: Normal appearance.  HENT:     Head: Normocephalic and atraumatic.  Pulmonary:     Effort: Pulmonary effort is normal.     Breath sounds: Normal breath sounds.  Genitourinary:    General: Normal vulva.     Exam position: Lithotomy position.     Vagina: Normal.     Cervix: Normal.  Skin:    General: Skin is warm and dry.  Neurological:     General: No focal deficit present.     Mental Status: She is alert.  Psychiatric:        Mood and Affect: Mood normal.        Behavior: Behavior normal.        Thought Content: Thought content normal.        Judgment: Judgment normal.       Assessment & Plan:   1. High grade squamous intraepithelial lesion (HGSIL), grade 3 CIN, on biopsy of cervix (Primary) Completed colposcopy due to positive margin on LEEP. See separate procedure note.  - Pregnancy, urine POC - Surgical pathology  2. Infertility counseling Referral to REI. Discussed options of HSG vs diagnostic laparoscopy with intraoperative chromotubation.  - Ambulatory referral to Infertility   Routine preventative health maintenance measures emphasized.  [name], MD [facility] for [facility], [facility]

## 2023-09-15 NOTE — Patient Instructions (Signed)
Contact office on first day of your next cycle to schedule hysteroscalpingogram

## 2023-09-16 NOTE — Progress Notes (Addendum)
    GYNECOLOGY OFFICE COLPOSCOPY PROCEDURE NOTE  38 y.o. [id number] here for colposcopy for  CIN 3 on LEEP w/ positive margins   01/11/2023. Discussed role for HPV in cervical dysplasia, need for surveillance.  Patient gave informed written consent, time out was performed.  Placed in lithotomy position. Cervix viewed with speculum and colposcope after application of acetic acid.   Colposcopy adequate? Yes  visible lesion(s) at 6 o'clock and acetowhite lesion(s) noted at 7 & 12 o'clock; corresponding biopsies obtained.  Non lugol's uptake at SCJ. ECC specimen obtained. All specimens were labeled and sent to pathology.  Chaperone was present during entire procedure.  Patient was given post procedure instructions.  Will follow up pathology and manage accordingly; patient will be contacted with results and recommendations.  Routine preventative health maintenance measures emphasized.    [name], MD, FACOG Minimally Invasive Gynecologic Surgery  Obstetrics and Gynecology, [facility] for [facility], [facility] 09/16/2023

## 2023-09-19 LAB — SURGICAL PATHOLOGY

## 2023-09-19 NOTE — Progress Notes (Signed)
See attestation from last [facility] note

## 2023-09-19 NOTE — Progress Notes (Signed)
 Internal Medicine Clinic Attending  Case discussed with the resident at the time of the visit.  We reviewed the resident's history and exam and pertinent patient test results.  I agree with the assessment, diagnosis, and plan of care documented in the resident's note.

## 2023-09-20 ENCOUNTER — Encounter: Payer: Self-pay | Admitting: Obstetrics and Gynecology

## 2023-09-20 NOTE — Congregational Nurse Program (Unsigned)
  Dept: [phone]   Congregational Nurse Program Note  Date of Encounter: 09/20/2023  Clinic visit to follow up on plans to discuss nutrition requirements and dietary changes needed to manage type 2 diabetes.Resident and family have just recovered from influenza. Resident is to keep a food diary for one week to discuss at next clinic visit. Past Medical History: Past Medical History:  Diagnosis Date   Anemia    Anemia    Asthma    Back pain, chronic    Hypertension    Muscle spasm     Encounter Details:  Community Questionnaire - 09/20/23 1619       Questionnaire   Ask client: Do you give verbal consent for me to treat you today? Yes    Student Assistance N/A    Location Patient Served  [place]    Encounter Setting CN site    Population Status Unknown    Insurance Medicaid    Insurance/Financial Assistance Referral N/A    Medication N/A    Medical Provider Yes    Screening Referrals Made N/A    Medical Referrals Made N/A    Medical Appointment Completed [facility] PCP/Clinic    CNP Interventions Advocate/Support;Counsel;Educate    Screenings CN Performed N/A    ED Visit Averted N/A    Life-Saving Intervention Made N/A

## 2023-09-22 ENCOUNTER — Telehealth: Payer: Self-pay | Admitting: Obstetrics and Gynecology

## 2023-09-22 NOTE — Telephone Encounter (Signed)
Called x2, unable to leave voicemail. Will reach out again at a later time.

## 2023-09-27 ENCOUNTER — Telehealth: Payer: Self-pay | Admitting: Student

## 2023-09-27 ENCOUNTER — Encounter: Payer: Self-pay | Admitting: Dietician

## 2023-09-27 NOTE — Telephone Encounter (Signed)
  Called to follow-up on how she was doing with creating balanced healthy meals as well as adhering to her new weight loss medication.  There was no answer and no voicemail.  Diane Rice, Student-Dietician 09/27/2023 4:12 PM.

## 2023-10-12 ENCOUNTER — Encounter (HOSPITAL_COMMUNITY): Payer: Self-pay

## 2023-10-12 ENCOUNTER — Ambulatory Visit (HOSPITAL_COMMUNITY)
Admission: EM | Admit: 2023-10-12 | Discharge: 2023-10-12 | Disposition: A | Discharge status: Home / Self Care | Payer: MEDICAID | Attending: Emergency Medicine | Admitting: Emergency Medicine

## 2023-10-12 ENCOUNTER — Telehealth: Payer: Self-pay

## 2023-10-12 DIAGNOSIS — R051 Acute cough: Secondary | ICD-10-CM | POA: Insufficient documentation

## 2023-10-12 DIAGNOSIS — J069 Acute upper respiratory infection, unspecified: Secondary | ICD-10-CM | POA: Insufficient documentation

## 2023-10-12 LAB — POCT RAPID STREP A (OFFICE): Rapid Strep A Screen: NEGATIVE

## 2023-10-12 MED ORDER — AZITHROMYCIN 250 MG PO TABS: 250.0000 mg | ORAL_TABLET | Freq: Every day | ORAL | 0 refills | Status: DC

## 2023-10-12 MED ORDER — PROMETHAZINE-DM 6.25-15 MG/5ML PO SYRP: 5.0000 mL | ORAL_SOLUTION | Freq: Four times a day (QID) | ORAL | 0 refills | Status: DC | PRN

## 2023-10-12 NOTE — ED Provider Notes (Signed)
[facility]    CSN: [id number] Arrival date & time: 10/12/23  0800      History   Chief Complaint Chief Complaint  Patient presents with   Cough   Sore Throat   Nasal Congestion    HPI Diane Rice is a 38 y.o. female.   Patient presents to clinic complaining of a productive cough with mucus, sore throat, nasal congestion, rhinorrhea and fatigue that has been present for the past 2 or 3 weeks.  Multiple sick contacts at work.  Denies any fevers.  She does feel like she is short of breath, no wheezing or chest pain.  Has tried over-the-counter Hall's cough drops, tea and sore throat spray without much improvement.  Did notice some white patches at the back of her throat a few nights ago so she came into clinic.  The history is provided by the patient and medical records.  Cough Sore Throat    Past Medical History:  Diagnosis Date   Anemia    Anemia    Asthma    Back pain, chronic    Hypertension    Muscle spasm     Patient Active Problem List   Diagnosis Date Noted   Nasal congestion 09/08/2023   Type 2 diabetes mellitus without complication, without long-term current use of insulin (HCC) 08/15/2023   Bipolar I disorder, most recent episode (or current) manic (HCC) 06/24/2023   Alcohol use disorder, moderate, dependence (HCC) 03/22/2023   Dysmenorrhea 07/11/2022   Migraine without status migrainosus, not intractable 07/09/2022   Homelessness 07/09/2022   Health care maintenance 07/09/2022   PTSD (post-traumatic stress disorder) 07/06/2022   Morbid obesity with BMI of 40.0-44.9, adult (HCC) 03/19/2010   Lumbar back pain 03/19/2010   Vitamin D deficiency 01/01/2010   Iron deficiency anemia 12/30/2009   Mood disorder (HCC) 12/30/2009   Chronic pain syndrome 12/30/2009   Asthma 12/30/2009    Past Surgical History:  Procedure Laterality Date   MOLE REMOVAL     WISDOM TOOTH EXTRACTION      OB History     Gravida  2   Para  2   Term   2   Preterm  0   AB  0   Living  2      SAB  0   IAB  0   Ectopic  0   Multiple  0   Live Births  2            Home Medications    Prior to Admission medications   Medication Sig Start Date End Date Taking? Authorizing Provider  azithromycin (ZITHROMAX) 250 MG tablet Take 1 tablet (250 mg total) by mouth daily. Take first 2 tablets together, then 1 every day until finished. 10/12/23  Yes Rinaldo Ratel, [place] N, FNP  promethazine-dextromethorphan (PROMETHAZINE-DM) 6.25-15 MG/5ML syrup Take 5 mLs by mouth 4 (four) times daily as needed for cough. 10/12/23  Yes Rinaldo Ratel, [place] N, FNP  ARIPiprazole (ABILIFY) 5 MG tablet Take 1/2 tab daily for 1 week and than full tab daily 12/08/22   [provider]  diclofenac Sodium (VOLTAREN) 1 % GEL Apply 2 g topically 4 (four) times daily. Patient not taking: Reported on 09/15/2023 09/08/23   Rana Snare, DO  DULoxetine (CYMBALTA) 20 MG capsule Take 2 capsules (40 mg total) by mouth daily. Patient not taking: Reported on 09/15/2023 08/15/23   Rana Snare, DO  ferrous sulfate 325 (65 FE) MG EC tablet Take 1 tablet (325 mg total) by  mouth every other day. Patient not taking: Reported on 09/15/2023 01/05/23 01/05/24  Rana Snare, DO  fluticasone [facility]) 50 MCG/ACT nasal spray Place 1 spray into both nostrils daily. 09/08/23 09/07/24  Rana Snare, DO  guaiFENesin-dextromethorphan (ROBITUSSIN DM) 100-10 MG/5ML syrup Take 5 mLs by mouth every 4 (four) hours as needed for cough. Patient not taking: Reported on 09/15/2023 09/08/23   Rana Snare, DO  lamoTRIgine (LAMICTAL) 25 MG tablet Take 1 tablet (25 mg total) by mouth daily for 14 days, THEN 2 tablets (50 mg total) daily. 05/19/23 06/01/24  Nwoko, Tommas Olp, PA  Semaglutide,0.25 or 0.5MG /DOS, 2 MG/3ML SOPN Inject 0.25 mg into the skin once a week. Take for 4 weeks, if tolerating well, increase to 0.5 mg weekly injection. Patient not taking: Reported on 09/15/2023 09/08/23   Tawkaliyar, Roya,  DO  tranexamic acid (LYSTEDA) 650 MG TABS tablet Take 2 tablets (1,300 mg total) by mouth 3 (three) times daily. Take during menses for a maximum of five days 02/09/23   [name], MD  traZODone (DESYREL) 50 MG tablet Take 1 tablet (50 mg total) by mouth at bedtime. 05/19/23   Meta Hatchet, PA    Family History Family History  Problem Relation Age of Onset   Depression Mother    Bipolar disorder Sister     Social History Social History   Tobacco Use   Smoking status: Former    Current packs/day: 0.00    Average packs/day: 0.5 packs/day for 13.0 years (6.5 ttl pk-yrs)    Types: Cigarettes    Start date: 06/30/1996    Quit date: 06/30/2009    Years since quitting: 14.2   Smokeless tobacco: Never  Vaping Use   Vaping status: Never Used  Substance Use Topics   Alcohol use: Yes    Alcohol/week: 1.0 - 2.0 standard drink of alcohol    Types: 1 - 2 Cans of beer per week    Comment: socially   Drug use: No     Allergies   Hydrocodone-acetaminophen   Review of Systems Review of Systems  Per HPI   Physical Exam Triage Vital Signs ED Triage Vitals  Encounter Vitals Group     BP 10/12/23 0825 133/80     Systolic BP Percentile --      Diastolic BP Percentile --      Pulse Rate 10/12/23 0825 75     Resp 10/12/23 0825 16     Temp 10/12/23 0825 98.2 F (36.8 C)     Temp Source 10/12/23 0825 Oral     SpO2 10/12/23 0825 96 %     Weight --      Height --      Head Circumference --      Peak Flow --      Pain Score 10/12/23 0824 4     Pain Loc --      Pain Education --      Exclude from Growth Chart --    No data found.  Updated Vital Signs BP 133/80 (BP Location: Right Arm)   Pulse 75   Temp 98.2 F (36.8 C) (Oral)   Resp 16   LMP 08/30/2023 (Approximate)   SpO2 96%   Visual Acuity Right Eye Distance:   Left Eye Distance:   Bilateral Distance:    Right Eye Near:   Left Eye Near:    Bilateral Near:     Physical Exam Vitals and nursing note  reviewed.  Constitutional:  Appearance: Normal appearance. She is well-developed.  HENT:     Head: Normocephalic and atraumatic.     Right Ear: Ear canal and external ear normal. A middle ear effusion is present.     Left Ear: Ear canal and external ear normal. A middle ear effusion is present.     Nose: Congestion and rhinorrhea present.     Mouth/Throat:     Mouth: Mucous membranes are moist.     Pharynx: Uvula midline. Posterior oropharyngeal erythema present.     Tonsils: Tonsillar exudate present. No tonsillar abscesses. 1+ on the right. 1+ on the left.  Cardiovascular:     Rate and Rhythm: Normal rate and regular rhythm.     Heart sounds: Normal heart sounds. No murmur heard. Pulmonary:     Effort: Pulmonary effort is normal. No respiratory distress.     Breath sounds: Normal breath sounds.  Skin:    General: Skin is warm and dry.  Neurological:     General: No focal deficit present.     Mental Status: She is alert and oriented to person, place, and time.  Psychiatric:        Mood and Affect: Mood normal.        Behavior: Behavior normal. Behavior is cooperative.      UC Treatments / Results  Labs (all labs ordered are listed, but only abnormal results are displayed) Labs Reviewed  CULTURE, GROUP A STREP [facility])  POCT RAPID STREP A (OFFICE)    EKG   Radiology No results found.  Procedures Procedures (including critical care time)  Medications Ordered in UC Medications - No data to display  Initial Impression / Assessment and Plan / UC Course  I have reviewed the triage vital signs and the nursing notes.  Pertinent labs & imaging results that were available during my care of the patient were reviewed by me and considered in my medical decision making (see chart for details).  Vitals and triage reviewed, patient is hemodynamically stable.  Lungs are vesicular, heart with regular rate and rhythm.  Bilateral middle ear effusions, tympanic membrane without  erythema or bulging, low concern for acute otitis media at this time.  Tonsils with 1+ swelling, exudate, uvula midline and low concern for PTA.  POC rapid strep negative, will send for culture.  Will treat with azithromycin for bacterial URI due to duration of symptoms.  Low concern for asthma exacerbation with stable respiratory rate, good air movement and lack of wheezing on physical exam.  Symptomatic management discussed.  Plan of care, follow-up care return precautions given, no questions at this time.     Final Clinical Impressions(s) / UC Diagnoses   Final diagnoses:  Upper respiratory tract infection, unspecified type  Acute cough     Discharge Instructions      Your strep testing was negative.  I am treating you with azithromycin for bacterial upper respiratory tract infection, take this as prescribed.  You can take it with food to help prevent gastrointestinal upset.  You can use the cough medicine up to 4 times daily, do not drink alcohol or drive on this medication as it may cause sedation.  Warm saline gargles, tea with honey and over-the-counter cough drops can help soothe your sore throat as well as sleeping with a humidifier.  Symptoms should get better with the medication, if no improvement or any changes please return to clinic or follow-up with your primary care provider for reevaluation.      ED Prescriptions  Medication Sig Dispense Auth. Provider   azithromycin (ZITHROMAX) 250 MG tablet Take 1 tablet (250 mg total) by mouth daily. Take first 2 tablets together, then 1 every day until finished. 6 tablet Rinaldo Ratel, [place], [place]   promethazine-dextromethorphan (PROMETHAZINE-DM) 6.25-15 MG/5ML syrup Take 5 mLs by mouth 4 (four) times daily as needed for cough. 118 mL Laverda Stribling, [place], [place]      PDMP not reviewed this encounter.   Rinaldo Ratel [place] N, [place] 10/12/23 [phone]

## 2023-10-12 NOTE — Telephone Encounter (Signed)
Decision:Approved  Hulan Fess (Key: [id number]) Rx #: [id number] Ozempic (0.25 or 0.5 MG/DOSE) 2MG /3ML pen-injectors Form [employer] Electronic Prior Authorization Form Created Message from Plan Approved. OZEMPIC (0.25 OR 0.5 MG/DOSE) 2MG /3ML Soln Pen-inj is approved from 10/11/2023 to 10/10/2024. All strengths of the drug are approved.. Authorization Expiration Date: October 10, 2024.

## 2023-10-12 NOTE — Telephone Encounter (Signed)
Prior Authorization for patient (Ozempic (0.25 or 0.5 MG/DOSE) 2MG /3ML pen-injectors) came through on cover my meds was submitted with last office notes and labs awaiting approval or denial.  WUJ:[id number]

## 2023-10-12 NOTE — ED Triage Notes (Signed)
Patient c/o a productive cough with yellow and brown green sputum, sore throat, and nasal congestion x 2-3 weeks.  Patient states she has been using sore throat spray, Halls cough drops and tea.

## 2023-10-12 NOTE — Discharge Instructions (Addendum)
Your strep testing was negative.  I am treating you with azithromycin for bacterial upper respiratory tract infection, take this as prescribed.  You can take it with food to help prevent gastrointestinal upset.  You can use the cough medicine up to 4 times daily, do not drink alcohol or drive on this medication as it may cause sedation.  Warm saline gargles, tea with honey and over-the-counter cough drops can help soothe your sore throat as well as sleeping with a humidifier.  Symptoms should get better with the medication, if no improvement or any changes please return to clinic or follow-up with your primary care provider for reevaluation.

## 2023-10-13 ENCOUNTER — Encounter: Payer: Self-pay | Admitting: Student

## 2023-10-14 LAB — CULTURE, GROUP A STREP (THRC)

## 2023-10-18 ENCOUNTER — Telehealth: Payer: Self-pay | Admitting: Obstetrics and Gynecology

## 2023-10-18 NOTE — Telephone Encounter (Signed)
Attempted to call to review results, phone call unable to be completed with listed phone number. Called patient son, no answer. Will send mychart message.

## 2023-10-20 ENCOUNTER — Telehealth (HOSPITAL_COMMUNITY): Payer: Self-pay | Admitting: Mental Health

## 2023-10-20 ENCOUNTER — Ambulatory Visit (HOSPITAL_COMMUNITY): Payer: MEDICAID | Admitting: Mental Health

## 2023-10-20 ENCOUNTER — Encounter (HOSPITAL_COMMUNITY): Payer: Self-pay

## 2023-10-20 NOTE — Telephone Encounter (Signed)
Therapist sent link for tele-therapy session x2; no answer. Contacted pt via telephone number on file; with receiving message pt is not able to receive calls at this time. No show.

## 2023-10-20 NOTE — Telephone Encounter (Signed)
Therapist received message from front desk pt requested call from therapist on contact number of [phone].  Pt returned call with pt stated was in need of pausing therapy sessions for x 1 month. Therapist educated of no show policy and in need of completing walk in appointment due to hx of x 2 no shows with [facility] OP. Therapist informed of her walk in days. Pt reported understanding. Denied immediate safety concerns.

## 2023-11-08 MED ORDER — SEMAGLUTIDE (1 MG/DOSE) 4 MG/3ML ~~LOC~~ SOPN: 1.0000 mg | PEN_INJECTOR | SUBCUTANEOUS | 1 refills | Status: DC

## 2023-11-09 ENCOUNTER — Telehealth: Payer: Self-pay | Admitting: Student

## 2023-11-09 NOTE — Telephone Encounter (Signed)
 Copied from CRM [id number]. Topic: Clinical - Prescription Issue >> Nov 09, 2023  3:10 PM Diane Rice wrote: Reason for CRM: Patient called and stated they need a authorization for the patient to get her Ozempic because the dose was changed from .5 to 1.0 but she stated that the pharmacy told her it was suppose to be 2.5 and its approved but medicaid wont pay for it, patients callback number is [phone].

## 2023-11-09 NOTE — Telephone Encounter (Signed)
 Copied from CRM [id number]. Topic: Clinical - Prescription Issue >> Nov 09, 2023  3:10 PM Maree Krabbe H wrote: Reason for CRM: Patient called and stated they need a authorization for the patient to get her Ozempic because the dose was changed from .5 to 1.0 but she stated that the pharmacy told her it was suppose to be 2.5 and its approved but medicaid wont pay for it, patients callback number is [phone].

## 2023-11-10 NOTE — Telephone Encounter (Signed)
 Pt has upcoming appt with Dr. Sherrilee Gilles on 12/12/2023.

## 2023-11-14 ENCOUNTER — Telehealth: Payer: Self-pay | Admitting: *Deleted

## 2023-11-14 ENCOUNTER — Telehealth: Payer: Self-pay

## 2023-11-14 NOTE — Telephone Encounter (Signed)
 Call to patient problem getting as pharmacy and insurance have question about patient going to the 1mg .  Patient stated that she had completed the .5mg  dosing.   And is due to g up to the 1 mg.  Problem as she did not start on the.25mg  dosing first.  Will forward to PCP and PA co-ordinators.

## 2023-11-14 NOTE — Telephone Encounter (Signed)
 Prior Authorization for patient has been submitted awaiting approval or denial.

## 2023-11-14 NOTE — Telephone Encounter (Signed)
 Copied from CRM [id number]. Topic: Clinical - Prescription Issue >> Nov 11, 2023  2:47 PM [name] wrote: Reason for CRM: patient states special authorization is needed because medication got flagged because the dr skipped a dosage of the ozempic

## 2023-11-14 NOTE — Telephone Encounter (Signed)
 Prior Authorization for patient (Ozempic (1 MG/DOSE) 4MG /3ML pen-injectors) came through on cover my meds was submitted with last office notes and labs awaiting approval or denial.  UXL:[id number]

## 2023-11-14 NOTE — Telephone Encounter (Addendum)
 Diane Rice (Key: [id number]) Ozempic (1 MG/DOSE) 4MG [name] pen-injectors Form [employer] Medicaid Electronic Prior Authorization Form Created Message from Plan No Authorization Required.Prior Authorization Not Required.The medication you are requesting has already been approved on 10/11/2023. If the pharmacy is having issues processing the claim, please have them call Pharmacy Services for assistance at [phone].  Patient aware

## 2023-11-16 ENCOUNTER — Telehealth: Payer: Self-pay | Admitting: *Deleted

## 2023-11-16 ENCOUNTER — Encounter: Payer: Self-pay | Admitting: Student

## 2023-11-16 NOTE — Telephone Encounter (Signed)
 Per chart, PA for 1.0 mg has been done by Jada (3/17).

## 2023-11-16 NOTE — Telephone Encounter (Signed)
 Copied from CRM [id number]. Topic: Clinical - Prescription Issue >> Nov 16, 2023  8:26 AM Hamdi H wrote: Reason for CRM: Pt is calling back about still having issues with her prior auth for Ozempic medication. The pharmacy is still telling her they can't fill it due to the prior auth. Pharmacy believes the issue is due to her being started off .5 mg instead of the recommended .25 mg. Since she is at work now she would like for you to leave a detailed message on her voicemail if she misses the phone call.

## 2023-11-16 NOTE — Telephone Encounter (Signed)
 Copied from CRM [id number]. Topic: Clinical - Prescription Issue >> Nov 16, 2023  2:30 PM Antony Haste wrote: Reason for CRM: The patient's pharmacy has billed her Ozempic medication incorrectly for 56 days, stating her medication only last for those 56 days. Her insurance [employer] states the days of supply listed on her Ozempic must be corrected by the pharmacy for her Ozempic to be fully covered. If this is left billed incorrectly, the patient would have to pay $2,000 to retreive this from her pharmacy. I advised the update left by CMA Jada on 03/17, for the pharmacy to contact Pharmacy Services at [phone]. The patient is requesting someone to speak with her pharmacy directly to get this resolved.

## 2023-11-16 NOTE — Telephone Encounter (Signed)
 [facility] pharmacy stated they have already spoken to the doctor. Pt has taken Ozempic incorrectly; instead of lasting 56 days, it has only lasted 28 days. The pharmacist stated pt was supposed to start at the lower dose instead at 1.0 mg.

## 2023-11-21 NOTE — Telephone Encounter (Signed)
 Pt picked up Ozempic 1.0 mg sample.

## 2023-12-02 ENCOUNTER — Other Ambulatory Visit: Payer: Self-pay

## 2023-12-02 ENCOUNTER — Emergency Department (HOSPITAL_COMMUNITY)
Admission: EM | Admit: 2023-12-02 | Discharge: 2023-12-03 | Disposition: A | Discharge status: Home / Self Care | Payer: MEDICAID | Attending: Emergency Medicine | Admitting: Emergency Medicine

## 2023-12-02 ENCOUNTER — Encounter (HOSPITAL_COMMUNITY): Payer: Self-pay | Admitting: Emergency Medicine

## 2023-12-02 DIAGNOSIS — I10 Essential (primary) hypertension: Secondary | ICD-10-CM | POA: Diagnosis not present

## 2023-12-02 DIAGNOSIS — Z87891 Personal history of nicotine dependence: Secondary | ICD-10-CM | POA: Insufficient documentation

## 2023-12-02 DIAGNOSIS — R1030 Lower abdominal pain, unspecified: Secondary | ICD-10-CM | POA: Diagnosis present

## 2023-12-02 DIAGNOSIS — D649 Anemia, unspecified: Secondary | ICD-10-CM | POA: Insufficient documentation

## 2023-12-02 DIAGNOSIS — N939 Abnormal uterine and vaginal bleeding, unspecified: Secondary | ICD-10-CM | POA: Insufficient documentation

## 2023-12-02 DIAGNOSIS — R109 Unspecified abdominal pain: Secondary | ICD-10-CM

## 2023-12-02 LAB — URINALYSIS, ROUTINE W REFLEX MICROSCOPIC
Bilirubin Urine: NEGATIVE
Glucose, UA: NEGATIVE mg/dL
Ketones, ur: 5 mg/dL — AB
Nitrite: NEGATIVE
Protein, ur: 30 mg/dL — AB
Specific Gravity, Urine: 1.038 — ABNORMAL HIGH (ref 1.005–1.030)
pH: 5 (ref 5.0–8.0)

## 2023-12-02 LAB — CBC
HCT: 25.5 % — ABNORMAL LOW (ref 36.0–46.0)
Hemoglobin: 7.2 g/dL — ABNORMAL LOW (ref 12.0–15.0)
MCH: 20.3 pg — ABNORMAL LOW (ref 26.0–34.0)
MCHC: 28.2 g/dL — ABNORMAL LOW (ref 30.0–36.0)
MCV: 72 fL — ABNORMAL LOW (ref 80.0–100.0)
Platelets: 362 10*3/uL (ref 150–400)
RBC: 3.54 MIL/uL — ABNORMAL LOW (ref 3.87–5.11)
RDW: 20.6 % — ABNORMAL HIGH (ref 11.5–15.5)
WBC: 7.7 10*3/uL (ref 4.0–10.5)
nRBC: 0 % (ref 0.0–0.2)

## 2023-12-02 NOTE — ED Triage Notes (Signed)
 Pt in with abdominal cramping and vaginal bleeding since this morning, described as "black stringy tissue". LMP 2wks ago, and pt states she is currently under trx for prevention of cervical CA. Has had 2 biopsies and removals in the past few months.

## 2023-12-03 ENCOUNTER — Emergency Department (HOSPITAL_COMMUNITY): Payer: MEDICAID

## 2023-12-03 LAB — COMPREHENSIVE METABOLIC PANEL WITH GFR
ALT: 28 U/L (ref 0–44)
AST: 29 U/L (ref 15–41)
Albumin: 3.7 g/dL (ref 3.5–5.0)
Alkaline Phosphatase: 52 U/L (ref 38–126)
Anion gap: 11 (ref 5–15)
BUN: 12 mg/dL (ref 6–20)
CO2: 21 mmol/L — ABNORMAL LOW (ref 22–32)
Calcium: 9.5 mg/dL (ref 8.9–10.3)
Chloride: 106 mmol/L (ref 98–111)
Creatinine, Ser: 0.97 mg/dL (ref 0.44–1.00)
GFR, Estimated: >60 mL/min (ref >60)
Glucose, Bld: 110 mg/dL — ABNORMAL HIGH (ref 70–99)
Potassium: 3.8 mmol/L (ref 3.5–5.1)
Sodium: 138 mmol/L (ref 135–145)
Total Bilirubin: 0.2 mg/dL (ref 0.0–1.2)
Total Protein: 7.2 g/dL (ref 6.5–8.1)

## 2023-12-03 LAB — LIPASE, BLOOD: Lipase: 50 U/L (ref 11–51)

## 2023-12-03 LAB — HCG, SERUM, QUALITATIVE: Preg, Serum: NEGATIVE

## 2023-12-03 MED ORDER — KETOROLAC TROMETHAMINE 30 MG/ML IJ SOLN
30.0000 mg | Freq: Once | INTRAMUSCULAR | Status: AC
  Administered 2023-12-03: 30 mg via INTRAMUSCULAR
  Filled 2023-12-03: qty 1

## 2023-12-03 MED ORDER — OXYCODONE HCL 5 MG PO TABS
5.0000 mg | ORAL_TABLET | Freq: Once | ORAL | Status: AC
  Administered 2023-12-03: 5 mg via ORAL
  Filled 2023-12-03: qty 1

## 2023-12-03 MED ORDER — NAPROXEN 250 MG PO TABS
500.0000 mg | ORAL_TABLET | Freq: Once | ORAL | Status: AC
  Administered 2023-12-03: 500 mg via ORAL
  Filled 2023-12-03: qty 2

## 2023-12-03 MED ORDER — NAPROXEN 500 MG PO TABS: 500.0000 mg | ORAL_TABLET | Freq: Two times a day (BID) | ORAL | 0 refills | Status: DC

## 2023-12-03 NOTE — Discharge Instructions (Signed)
 You were evaluated in the Emergency Department and after careful evaluation, we did not find any emergent condition requiring admission or further testing in the hospital.  Your exam/testing today is overall reassuring.  Recommend close follow-up with OB/GYN to discuss your symptoms.  Use the Naprosyn anti-inflammatory twice daily for pain.  Please return to the Emergency Department if you experience any worsening of your condition.   Thank you for allowing [place] to be a part of your care.

## 2023-12-03 NOTE — ED Notes (Signed)
 Patient returned from ultrasound.

## 2023-12-03 NOTE — ED Provider Notes (Signed)
 [facility]-EMERGENCY DEPT [facility] Emergency Department Provider Note MRN:  [id number]  Arrival date & time: 12/03/23     Chief Complaint   Abdominal Cramping and Vaginal Bleeding   History of Present Illness   Diane Rice is a 38 y.o. year-old female with presenting to the ED with chief complaint of abdominal pain.  Lower abdominal cramping and dark vaginal bleeding, the symptoms present since waking up yesterday morning.  Described as black stringy tissue exiting her vagina.  Has had some biopsies of her cervix about a month ago with OB/GYN, screening or prevention for cervical cancer.  Last menstrual period 2 weeks ago.  Denies fever, having some nausea.  Review of Systems  A thorough review of systems was obtained and all systems are negative except as noted in the HPI and PMH.   Patient's Health History    Past Medical History:  Diagnosis Date   Anemia    Anemia    Asthma    Back pain, chronic    Hypertension    Muscle spasm     Past Surgical History:  Procedure Laterality Date   MOLE REMOVAL     WISDOM TOOTH EXTRACTION      Family History  Problem Relation Age of Onset   Depression Mother    Bipolar disorder Sister     Social History   Socioeconomic History   Marital status: Married    Spouse name: jamal   Number of children: 2   Years of education: Not on file   Highest education level: 9th grade  Occupational History   Not on file  Tobacco Use   Smoking status: Former    Current packs/day: 0.00    Average packs/day: 0.5 packs/day for 13.0 years (6.5 ttl pk-yrs)    Types: Cigarettes    Start date: 06/30/1996    Quit date: 06/30/2009    Years since quitting: 14.4   Smokeless tobacco: Never  Vaping Use   Vaping status: Never Used  Substance and Sexual Activity   Alcohol use: Yes    Alcohol/week: 1.0 - 2.0 standard drink of alcohol    Types: 1 - 2 Cans of beer per week    Comment: socially   Drug use: No   Sexual activity: Yes    Birth  control/protection: None  Other Topics Concern   Not on file  Social History Narrative   Lives with husband and children. Used to work in [employer] but has not been working lately   Social Drivers of Corporate investment banker Strain: High Risk (07/09/2022)   Overall Financial Resource Strain (CARDIA)    Difficulty of Paying Living Expenses: Very hard  Food Insecurity: Food Insecurity Present (01/17/2023)   Hunger Vital Sign    Worried About Running Out of Food in the Last Year: Sometimes true    Ran Out of Food in the Last Year: Sometimes true  Transportation Needs: Unmet Transportation Needs (01/17/2023)   PRAPARE - Administrator, Civil Service (Medical): Yes    Lack of Transportation (Non-Medical): Yes  Physical Activity: Inactive (07/09/2022)   Exercise Vital Sign    Days of Exercise per Week: 0 days    Minutes of Exercise per Session: 0 min  Stress: Stress Concern Present (07/09/2022)   [employer] of Occupational Health - Occupational Stress Questionnaire    Feeling of Stress : Rather much  Social Connections: Moderately Isolated (03/22/2023)   Social Connection and Isolation Panel [NHANES]  Frequency of Communication with Friends and Family: Twice a week    Frequency of Social Gatherings with Friends and Family: Never    Attends Religious Services: More than 4 times per year    Active Member of [employer] or Organizations: No    Attends Banker Meetings: Never    Marital Status: Living with partner  Intimate Partner Violence: Not At Risk (01/17/2023)   Humiliation, Afraid, Rape, and Kick questionnaire    Fear of Current or Ex-Partner: No    Emotionally Abused: No    Physically Abused: No    Sexually Abused: No     Physical Exam   Vitals:   12/03/23 0400 12/03/23 0415  BP: 129/77 137/82  Pulse: 76 86  Resp:  20  Temp:    SpO2: 100% 98%    CONSTITUTIONAL: Well-appearing, NAD NEURO/PSYCH:  Alert and oriented x 3, no focal  deficits EYES:  eyes equal and reactive ENT/NECK:  no LAD, no JVD CARDIO: Regular rate, well-perfused, normal S1 and S2 PULM:  CTAB no wheezing or rhonchi GI/GU:  non-distended, mild lower abdominal tenderness MSK/SPINE:  No gross deformities, no edema SKIN:  no rash, atraumatic   *Additional and/or pertinent findings included in MDM below  Diagnostic and Interventional Summary    EKG Interpretation Date/Time:    Ventricular Rate:    PR Interval:    QRS Duration:    QT Interval:    QTC Calculation:   R Axis:      Text Interpretation:         Labs Reviewed  COMPREHENSIVE METABOLIC PANEL WITH GFR - Abnormal; Notable for the following components:      Result Value   CO2 21 (*)    Glucose, Bld 110 (*)    All other components within normal limits  CBC - Abnormal; Notable for the following components:   RBC 3.54 (*)    Hemoglobin 7.2 (*)    HCT 25.5 (*)    MCV 72.0 (*)    MCH 20.3 (*)    MCHC 28.2 (*)    RDW 20.6 (*)    All other components within normal limits  URINALYSIS, ROUTINE W REFLEX MICROSCOPIC - Abnormal; Notable for the following components:   APPearance HAZY (*)    Specific Gravity, Urine 1.038 (*)    Hgb urine dipstick LARGE (*)    Ketones, ur 5 (*)    Protein, ur 30 (*)    Leukocytes,Ua SMALL (*)    Bacteria, UA RARE (*)    All other components within normal limits  LIPASE, BLOOD  HCG, SERUM, QUALITATIVE    US Pelvis Complete  Final Result    US Transvaginal Non-OB  Final Result    [place] Art/Ven Flow Abd Pelv Doppler  Final Result      Medications  oxyCODONE (Oxy IR/ROXICODONE) immediate release tablet 5 mg (5 mg Oral Given 12/03/23 0157)  naproxen (NAPROSYN) tablet 500 mg (500 mg Oral Given 12/03/23 0157)  ketorolac (TORADOL) 30 MG/ML injection 30 mg (30 mg Intramuscular Given 12/03/23 1610)     Procedures  /  Critical Care Procedures  ED Course and Medical Decision Making  Initial Impression and Ddx Patient looks a bit uncomfortable, mild  lower abdominal tenderness, reassuring vital signs, no fever.  With the dark vaginal bleeding suspect shedding of the endometrial lining that was altered from the recent biopsy procedure.  Possibly some associated cramping.  Will ultrasound to exclude complication.  No rebound guarding or rigidity, no signs  of peritonitis at this time.  Past medical/surgical history that increases complexity of ED encounter: None  Interpretation of Diagnostics I personally reviewed the Laboratory Testing and my interpretation is as follows: Baseline anemia, otherwise no significant blood count or electrolyte disturbance.    Patient Reassessment and Ultimate Disposition/Management     Patient feeling much better, did not get much improvement from the oxycodone but the Toradol has largely relieved her symptoms.  Suspect uterine cramping type pain in the setting of some abnormal vaginal bleeding.  With reassuring ultrasound, baseline H&H, patient is appropriate for discharge, has follow-up with OB/GYN in 2 days.  Has plans to start iron infusions soon as well.  Strict return precautions.  Patient management required discussion with the following services or consulting groups:  None  Complexity of Problems Addressed Acute illness or injury that poses threat of life of bodily function  Additional Data Reviewed and Analyzed Further history obtained from: Further history from spouse/family member  Additional Factors Impacting ED Encounter Risk Consideration of hospitalization  Elmer Sow. Pilar Plate, MD [facility] Emergency Medicine [facility] [email or web address]  Final Clinical Impressions(s) / ED Diagnoses     ICD-10-CM   1. Vaginal bleeding  N93.9     2. Abdominal cramping  R10.9       ED Discharge Orders          Ordered    naproxen (NAPROSYN) 500 MG tablet  2 times daily        12/03/23 0424             Discharge Instructions Discussed with and Provided to Patient:      Discharge Instructions      You were evaluated in the Emergency Department and after careful evaluation, we did not find any emergent condition requiring admission or further testing in the hospital.  Your exam/testing today is overall reassuring.  Recommend close follow-up with OB/GYN to discuss your symptoms.  Use the Naprosyn anti-inflammatory twice daily for pain.  Please return to the Emergency Department if you experience any worsening of your condition.   Thank you for allowing [place] to be a part of your care.       Sabas Sous, MD 12/03/23 [phone]

## 2023-12-03 NOTE — ED Notes (Signed)
 Patient transported to Ultrasound

## 2023-12-06 ENCOUNTER — Ambulatory Visit (INDEPENDENT_AMBULATORY_CARE_PROVIDER_SITE_OTHER): Payer: MEDICAID | Admitting: Obstetrics and Gynecology

## 2023-12-06 ENCOUNTER — Encounter: Payer: Self-pay | Admitting: Obstetrics and Gynecology

## 2023-12-06 VITALS — BP 124/86 | HR 76 | Wt 280.8 lb

## 2023-12-06 DIAGNOSIS — D069 Carcinoma in situ of cervix, unspecified: Secondary | ICD-10-CM | POA: Diagnosis not present

## 2023-12-06 DIAGNOSIS — N939 Abnormal uterine and vaginal bleeding, unspecified: Secondary | ICD-10-CM | POA: Diagnosis not present

## 2023-12-06 DIAGNOSIS — Z3169 Encounter for other general counseling and advice on procreation: Secondary | ICD-10-CM

## 2023-12-06 NOTE — Progress Notes (Unsigned)
 GYNECOLOGY VISIT  Patient name: Diane Rice MRN [id number]  Date of birth: 21-Dec-1985 Chief Complaint:   Gynecologic Exam  History:  Diane Rice is a 38 y.o. G2P2002 being seen today for severe abdominal cramping and "black blood" for 1.5 days and thought it was due to pregnancy implantation. Prsented to ED for pain and the following day it stopped and had light pink spotting which resolved 2 days later. Having sharp pain on left side, frequent but getting worse.    Past Medical History:  Diagnosis Date   Anemia    Anemia    Asthma    Back pain, chronic    Hypertension    Muscle spasm     Past Surgical History:  Procedure Laterality Date   MOLE REMOVAL     WISDOM TOOTH EXTRACTION      The following portions of the patient's history were reviewed and updated as appropriate: allergies, current medications, past family history, past medical history, past social history, past surgical history and problem list.   Health Maintenance:   Last pap ***. Results were: {Pap findings:25134}. H/O abnormal pap: {yes/yes***/no:23866} Last mammogram: ***. Results were: {normal, abnormal, n/a:23837}. Family h/o breast cancer: {yes***/no:23838}   Review of Systems:  {Ros - complete:30496} Comprehensive review of systems was otherwise negative.   Objective:  Physical Exam BP 124/86   Pulse 76   Wt 280 lb 12.8 oz (127.4 kg)   LMP 11/18/2023 (Approximate)   BMI 38.08 kg/m    Physical Exam   Labs and Imaging US Pelvis Complete Result Date: 12/03/2023 CLINICAL DATA:  Lower abdominal pain and vaginal bleeding. EXAM: TRANSABDOMINAL AND TRANSVAGINAL ULTRASOUND OF PELVIS DOPPLER ULTRASOUND OF OVARIES TECHNIQUE: Both transabdominal and transvaginal ultrasound examinations of the pelvis were performed. Transabdominal technique was performed for global imaging of the pelvis including uterus, ovaries, adnexal regions, and pelvic cul-de-sac. It was necessary to proceed with  endovaginal exam following the transabdominal exam to visualize the endometrium and ovaries. Color and duplex Doppler ultrasound was utilized to evaluate blood flow to the ovaries. COMPARISON:  06/07/2022. FINDINGS: Uterus Measurements: 9.0 x 5.7 x 6.2 cm = volume: 166.44 mL. No fibroids or other mass visualized. Endometrium Thickness: 13.7 mm.  Endometrium appears heterogeneous. Right ovary Measurements: 3.2 x 1.7 x 1.8 cm = volume: 5.0 mL. Normal appearance/no adnexal mass. Left ovary Measurements: 3.7 x 2.7 x 2.7 cm = volume: 14.1 mL. A complex lesion in the left ovary with internal echoes with adjacent vascularity is noted measuring 1.7 x 1.1 x 1.4 cm. Pulsed Doppler evaluation of both ovaries demonstrates normal low-resistance arterial and venous waveforms. Other findings A trace amount of free fluid is noted in the pelvis. IMPRESSION: 1. No evidence of ovarian torsion. 2. Complex lesion in the left ovary with internal echoes measuring 1.7 x 1.1 x 1.4 cm with adjacent vascularity, possible corpus luteal or hemorrhagic cyst. The possibility of other lesion can not be completely excluded. Follow-up ultrasound is recommended in 6-12 weeks to document resolution. 3. Slightly heterogeneous endometrium. Electronically Signed   By: Thornell Sartorius M.D.   On: 12/03/2023 03:43   US Transvaginal Non-OB Result Date: 12/03/2023 CLINICAL DATA:  Lower abdominal pain and vaginal bleeding. EXAM: TRANSABDOMINAL AND TRANSVAGINAL ULTRASOUND OF PELVIS DOPPLER ULTRASOUND OF OVARIES TECHNIQUE: Both transabdominal and transvaginal ultrasound examinations of the pelvis were performed. Transabdominal technique was performed for global imaging of the pelvis including uterus, ovaries, adnexal regions, and pelvic cul-de-sac. It was necessary to proceed with endovaginal exam following the  transabdominal exam to visualize the endometrium and ovaries. Color and duplex Doppler ultrasound was utilized to evaluate blood flow to the ovaries.  COMPARISON:  06/07/2022. FINDINGS: Uterus Measurements: 9.0 x 5.7 x 6.2 cm = volume: 166.44 mL. No fibroids or other mass visualized. Endometrium Thickness: 13.7 mm.  Endometrium appears heterogeneous. Right ovary Measurements: 3.2 x 1.7 x 1.8 cm = volume: 5.0 mL. Normal appearance/no adnexal mass. Left ovary Measurements: 3.7 x 2.7 x 2.7 cm = volume: 14.1 mL. A complex lesion in the left ovary with internal echoes with adjacent vascularity is noted measuring 1.7 x 1.1 x 1.4 cm. Pulsed Doppler evaluation of both ovaries demonstrates normal low-resistance arterial and venous waveforms. Other findings A trace amount of free fluid is noted in the pelvis. IMPRESSION: 1. No evidence of ovarian torsion. 2. Complex lesion in the left ovary with internal echoes measuring 1.7 x 1.1 x 1.4 cm with adjacent vascularity, possible corpus luteal or hemorrhagic cyst. The possibility of other lesion can not be completely excluded. Follow-up ultrasound is recommended in 6-12 weeks to document resolution. 3. Slightly heterogeneous endometrium. Electronically Signed   By: Thornell Sartorius M.D.   On: 12/03/2023 03:43   [place] Art/Ven Flow Abd Pelv Doppler Result Date: 12/03/2023 CLINICAL DATA:  Lower abdominal pain and vaginal bleeding. EXAM: TRANSABDOMINAL AND TRANSVAGINAL ULTRASOUND OF PELVIS DOPPLER ULTRASOUND OF OVARIES TECHNIQUE: Both transabdominal and transvaginal ultrasound examinations of the pelvis were performed. Transabdominal technique was performed for global imaging of the pelvis including uterus, ovaries, adnexal regions, and pelvic cul-de-sac. It was necessary to proceed with endovaginal exam following the transabdominal exam to visualize the endometrium and ovaries. Color and duplex Doppler ultrasound was utilized to evaluate blood flow to the ovaries. COMPARISON:  06/07/2022. FINDINGS: Uterus Measurements: 9.0 x 5.7 x 6.2 cm = volume: 166.44 mL. No fibroids or other mass visualized. Endometrium Thickness: 13.7 mm.   Endometrium appears heterogeneous. Right ovary Measurements: 3.2 x 1.7 x 1.8 cm = volume: 5.0 mL. Normal appearance/no adnexal mass. Left ovary Measurements: 3.7 x 2.7 x 2.7 cm = volume: 14.1 mL. A complex lesion in the left ovary with internal echoes with adjacent vascularity is noted measuring 1.7 x 1.1 x 1.4 cm. Pulsed Doppler evaluation of both ovaries demonstrates normal low-resistance arterial and venous waveforms. Other findings A trace amount of free fluid is noted in the pelvis. IMPRESSION: 1. No evidence of ovarian torsion. 2. Complex lesion in the left ovary with internal echoes measuring 1.7 x 1.1 x 1.4 cm with adjacent vascularity, possible corpus luteal or hemorrhagic cyst. The possibility of other lesion can not be completely excluded. Follow-up ultrasound is recommended in 6-12 weeks to document resolution. 3. Slightly heterogeneous endometrium. Electronically Signed   By: Thornell Sartorius M.D.   On: 12/03/2023 03:43       Assessment & Plan:   There are no diagnoses linked to this encounter.   *** Routine preventative health maintenance measures emphasized.  [name], MD Minimally Invasive Gynecologic Surgery [facility], [facility]

## 2023-12-06 NOTE — Patient Instructions (Addendum)
 [facility] [employer] [address] [place], [place] [address] Phone: [phone] Fax: [phone]  Call us on the first day of your next menstrual cycle to schedule your hysterosalpingogram (HSG - special xray to see if the tubes are open)

## 2023-12-06 NOTE — Congregational Nurse Program (Signed)
  Dept: [phone]   Congregational Nurse Program Note  Date of Encounter: 12/06/2023  Sent resident an email asking if she is still interested in clinic visits to discuss nutrition for weight loss and managing Type 2 diabetes. Past Medical History: Past Medical History:  Diagnosis Date   Anemia    Anemia    Asthma    Back pain, chronic    Hypertension    Muscle spasm     Encounter Details:  Community Questionnaire - 12/06/23 1417       Questionnaire   Ask client: Do you give verbal consent for me to treat you today? N/A   Sent resident an email regarding nutrition education   Student Assistance N/A    Location Patient Served  [facility]    Encounter Setting CN site;Phone/Text/Email    Population Status Unknown   Has apartment with family at [facility].   Insurance Medicaid    Insurance/Financial Assistance Referral N/A    Medication N/A    Medical Provider Yes    Screening Referrals Made N/A    Medical Referrals Made N/A    Medical Appointment Completed N/A    CNP Interventions Counsel    Screenings CN Performed N/A    ED Visit Averted N/A    Life-Saving Intervention Made N/A

## 2023-12-07 ENCOUNTER — Other Ambulatory Visit: Payer: Self-pay | Admitting: Obstetrics and Gynecology

## 2023-12-07 DIAGNOSIS — D069 Carcinoma in situ of cervix, unspecified: Secondary | ICD-10-CM

## 2023-12-09 ENCOUNTER — Telehealth: Payer: Self-pay

## 2023-12-09 NOTE — Telephone Encounter (Signed)
 Spoke with Diane Rice regarding her referral to GYN oncology. She has an appointment scheduled with Dr. Alvester Morin on 01/02/24  at 10:30. Patient agrees to date and time. She has been provided with office address and location. She is also aware of our mask and visitor policy. Patient verbalized understanding and will call with any questions.

## 2023-12-12 ENCOUNTER — Encounter: Payer: Self-pay | Admitting: Student

## 2023-12-12 ENCOUNTER — Ambulatory Visit: Payer: MEDICAID | Admitting: Student

## 2023-12-12 VITALS — Ht 72.0 in | Wt 284.0 lb

## 2023-12-12 DIAGNOSIS — D509 Iron deficiency anemia, unspecified: Secondary | ICD-10-CM | POA: Diagnosis not present

## 2023-12-12 DIAGNOSIS — D069 Carcinoma in situ of cervix, unspecified: Secondary | ICD-10-CM

## 2023-12-12 DIAGNOSIS — E119 Type 2 diabetes mellitus without complications: Secondary | ICD-10-CM | POA: Diagnosis not present

## 2023-12-12 DIAGNOSIS — Z7985 Long-term (current) use of injectable non-insulin antidiabetic drugs: Secondary | ICD-10-CM

## 2023-12-12 LAB — CBC
HCT: 26.5 % — ABNORMAL LOW (ref 36.0–46.0)
Hemoglobin: 7.6 g/dL — ABNORMAL LOW (ref 12.0–15.0)
MCH: 20.6 pg — ABNORMAL LOW (ref 26.0–34.0)
MCHC: 28.7 g/dL — ABNORMAL LOW (ref 30.0–36.0)
MCV: 71.8 fL — ABNORMAL LOW (ref 80.0–100.0)
Platelets: 448 10*3/uL — ABNORMAL HIGH (ref 150–400)
RBC: 3.69 MIL/uL — ABNORMAL LOW (ref 3.87–5.11)
RDW: 20.8 % — ABNORMAL HIGH (ref 11.5–15.5)
WBC: 6.8 10*3/uL (ref 4.0–10.5)
nRBC: 0.3 % — ABNORMAL HIGH (ref 0.0–0.2)

## 2023-12-12 NOTE — Assessment & Plan Note (Signed)
 Follows with OBGYN. Last note from 4/8 stated recent colposcopy with high grade changes. Hx of prior LEEP and recommend reexcision given CIN-3. Referral was sent to GYN oncology with appt on 5/5.

## 2023-12-12 NOTE — Assessment & Plan Note (Addendum)
 Presents today with few days of lightheadedness and dizzy. Denies chest pain, SOB, or headache. Had abdominal cramping with cycle. Reports heavy menstrual cycle started on Friday and still has bleeding today. Had couple nosebleeds that self-resolved. Orthostatic vitals done today negative with systolic 114-126. Patient states she has not scheduled IV iron infusion since last OV in January despite providing contact info at last OV. Was on oral iron without much improvement. Recent iron studies remain consistent with IDA.   STAT CBC with stable Hgb at 7.6. Will assist with setting up iron infusions. Discussed return precautions with patient.   Plan -Coordinate IV iron infusions  -F/u visit in 1-2 weeks -Return precautions given to patient if worsening symptoms to seek UC/ED

## 2023-12-12 NOTE — Patient Instructions (Signed)
 Thank you, [name] for allowing us  to provide your care today. Today we discussed   -Dizzy and lightheaded with ongoing menstrual bleeding -We got STAT blood work to check your blood counts    -I will call if your blood count is low and need to go to the emergency department for possible transfusion -still need IV iron infusions   I have ordered the following labs for you: Lab Orders         CBC no Diff       Follow up:  1-2 weeks    Should you have any questions or concerns please call the internal medicine clinic at [phone].    Diane Rice, D.O. [facility]

## 2023-12-12 NOTE — Progress Notes (Signed)
 CC: lightheaded, menstrual bleeding  HPI:  Diane Rice is a 38 y.o. female living with a history stated below and presents today for lightheaded with recent menstrual cycle. Please see problem based assessment and plan for additional details.  Past Medical History:  Diagnosis Date   Anemia    Anemia    Asthma    Back pain, chronic    Hypertension    Muscle spasm     Current Outpatient Medications on File Prior to Visit  Medication Sig Dispense Refill   ARIPiprazole (ABILIFY) 5 MG tablet Take 1/2 tab daily for 1 week and than full tab daily 30 tablet 0   azithromycin (ZITHROMAX) 250 MG tablet Take 1 tablet (250 mg total) by mouth daily. Take first 2 tablets together, then 1 every day until finished. (Patient not taking: Reported on 12/06/2023) 6 tablet 0   diclofenac Sodium (VOLTAREN) 1 % GEL Apply 2 g topically 4 (four) times daily. (Patient not taking: Reported on 12/06/2023) 100 g 5   DULoxetine (CYMBALTA) 20 MG capsule Take 2 capsules (40 mg total) by mouth daily. (Patient not taking: Reported on 12/06/2023) 60 capsule 2   ferrous sulfate 325 (65 FE) MG EC tablet Take 1 tablet (325 mg total) by mouth every other day. (Patient not taking: Reported on 12/06/2023) 15 tablet 11   fluticasone (FLONASE) 50 MCG/ACT nasal spray Place 1 spray into both nostrils daily. (Patient not taking: Reported on 12/06/2023) 64 mL 3   guaiFENesin-dextromethorphan (ROBITUSSIN DM) 100-10 MG/5ML syrup Take 5 mLs by mouth every 4 (four) hours as needed for cough. (Patient not taking: Reported on 12/06/2023) 118 mL 0   lamoTRIgine (LAMICTAL) 25 MG tablet Take 1 tablet (25 mg total) by mouth daily for 14 days, THEN 2 tablets (50 mg total) daily. (Patient not taking: Reported on 12/06/2023) 60 tablet 1   naproxen (NAPROSYN) 500 MG tablet Take 1 tablet (500 mg total) by mouth 2 (two) times daily. 30 tablet 0   promethazine-dextromethorphan (PROMETHAZINE-DM) 6.25-15 MG/5ML syrup Take 5 mLs by mouth 4 (four) times  daily as needed for cough. (Patient not taking: Reported on 12/06/2023) 118 mL 0   Semaglutide, 1 MG/DOSE, 4 MG/3ML SOPN Inject 1 mg into the skin once a week. 3 mL 1   Semaglutide,0.25 or 0.5MG /DOS, 2 MG/3ML SOPN Inject 0.25 mg into the skin once a week. Take for 4 weeks, if tolerating well, increase to 0.5 mg weekly injection. 3 mL 1   tranexamic acid (LYSTEDA) 650 MG TABS tablet Take 2 tablets (1,300 mg total) by mouth 3 (three) times daily. Take during menses for a maximum of five days 30 tablet 6   traZODone (DESYREL) 50 MG tablet Take 1 tablet (50 mg total) by mouth at bedtime. (Patient not taking: Reported on 12/06/2023) 30 tablet 1   No current facility-administered medications on file prior to visit.    Family History  Problem Relation Age of Onset   Depression Mother    Bipolar disorder Sister     Social History   Socioeconomic History   Marital status: Married    Spouse name: jamal   Number of children: 2   Years of education: Not on file   Highest education level: 9th grade  Occupational History   Not on file  Tobacco Use   Smoking status: Former    Current packs/day: 0.00    Average packs/day: 0.5 packs/day for 13.0 years (6.5 ttl pk-yrs)    Types: Cigarettes    Start date:  06/30/1996    Quit date: 06/30/2009    Years since quitting: 14.4   Smokeless tobacco: Never  Vaping Use   Vaping status: Never Used  Substance and Sexual Activity   Alcohol use: Yes    Alcohol/week: 1.0 - 2.0 standard drink of alcohol    Types: 1 - 2 Cans of beer per week    Comment: socially   Drug use: No   Sexual activity: Yes    Birth control/protection: None  Other Topics Concern   Not on file  Social History Narrative   Lives with husband and children. Used to work in [employer] but has not been working lately   Social Drivers of Corporate investment banker Strain: High Risk (07/09/2022)   Overall Financial Resource Strain (CARDIA)    Difficulty of Paying Living Expenses:  Very hard  Food Insecurity: Food Insecurity Present (01/17/2023)   Hunger Vital Sign    Worried About Running Out of Food in the Last Year: Sometimes true    Ran Out of Food in the Last Year: Sometimes true  Transportation Needs: Unmet Transportation Needs (01/17/2023)   PRAPARE - Administrator, Civil Service (Medical): Yes    Lack of Transportation (Non-Medical): Yes  Physical Activity: Inactive (07/09/2022)   Exercise Vital Sign    Days of Exercise per Week: 0 days    Minutes of Exercise per Session: 0 min  Stress: Stress Concern Present (07/09/2022)   [employer] of Occupational Health - Occupational Stress Questionnaire    Feeling of Stress : Rather much  Social Connections: Moderately Isolated (03/22/2023)   Social Connection and Isolation Panel [NHANES]    Frequency of Communication with Friends and Family: Twice a week    Frequency of Social Gatherings with Friends and Family: Never    Attends Religious Services: More than 4 times per year    Active Member of [employer] or Organizations: No    Attends Banker Meetings: Never    Marital Status: Living with partner  Intimate Partner Violence: Not At Risk (01/17/2023)   Humiliation, Afraid, Rape, and Kick questionnaire    Fear of Current or Ex-Partner: No    Emotionally Abused: No    Physically Abused: No    Sexually Abused: No    Review of Systems: ROS negative except for what is noted on the assessment and plan.  Vitals:   12/12/23 1605  SpO2: 100%  Weight: 284 lb (128.8 kg)  Height: 6' (1.829 m)   Physical Exam: Constitutional: alert, sitting in chair, in no acute distress Cardiovascular: regular rate and rhythm Pulmonary/Chest: normal work of breathing on room air Neurological: alert & oriented x 3 Skin: warm and dry Psych: normal mood and affect  Assessment & Plan:   Iron deficiency anemia Presents today with few days of lightheadedness and dizzy. Denies chest pain, SOB, or headache.  Had abdominal cramping with cycle. Reports heavy menstrual cycle started on Friday and still has bleeding today. Had couple nosebleeds that self-resolved. Orthostatic vitals done today negative with systolic 114-126. Patient states she has not scheduled IV iron infusion since last OV in January despite providing contact info at last OV. Was on oral iron without much improvement. Recent iron studies remain consistent with IDA.   STAT CBC with stable Hgb at 7.6. Will assist with setting up iron infusions. Discussed return precautions with patient.   Plan -Coordinate IV iron infusions  -F/u visit in 1-2 weeks -Return precautions given to patient  if worsening symptoms to seek UC/ED  Type 2 diabetes mellitus without complication, without long-term current use of insulin (HCC) Last A1c 6.2 in December. Tolerating well after increased dose of Ozempic to 1 mg weekly. Did not discuss in full details today due to anemia concern but plan to readdress at next OV.  Plan -Check A1c at next visit in 1-2 weeks -Continue Ozempic 1mg , titrate up if needed -Will need eye exam referral   High grade squamous intraepithelial lesion (HGSIL), grade 3 CIN, on biopsy of cervix Follows with OBGYN. Last note from 4/8 stated recent colposcopy with high grade changes. Hx of prior LEEP and recommend reexcision given CIN-3. Referral was sent to GYN oncology with appt on 5/5.    Patient discussed with Dr. Machen  Careem Yasui, D.O. [facility], PGY-2 Phone: [phone] Date 12/12/2023 Time 4:18 PM

## 2023-12-12 NOTE — Assessment & Plan Note (Addendum)
 Last A1c 6.2 in December. Tolerating well after increased dose of Ozempic to 1 mg weekly. Did not discuss in full details today due to anemia concern but plan to readdress at next OV.  Plan -Check A1c at next visit in 1-2 weeks -Continue Ozempic 1mg , titrate up if needed -Will need eye exam referral

## 2023-12-13 ENCOUNTER — Telehealth: Payer: Self-pay | Admitting: Internal Medicine

## 2023-12-13 NOTE — Telephone Encounter (Signed)
 Patient referred to infusion pharmacy team for ambulatory infusion of IV iron.  Insurance - [employer]  Dx code - D50.9 IV Iron Therapy - Feraheme 510 mg IV x 2  Infusion appointments - Scheduling team will schedule patient as soon as possible.    Keva Darty D. Johanne Mcglade, PharmD

## 2023-12-13 NOTE — Addendum Note (Signed)
 Addended by: Copelan Maultsby C on: 12/13/2023 01:29 PM   Modules accepted: Orders

## 2023-12-13 NOTE — Addendum Note (Signed)
 Addended by: Jearldine Mina on: 12/13/2023 01:16 PM   Modules accepted: Orders

## 2023-12-14 NOTE — Progress Notes (Signed)
 Internal Medicine Clinic Attending  Case discussed with the resident at the time of the visit.  We reviewed the resident's history and exam and pertinent patient test results.  I agree with the assessment, diagnosis, and plan of care documented in the resident's note.   I was worried that patient may need a blood transfusion, but am reassured that Hgb is stable at 7.6. Agree with plan for outpatient IV iron infusions

## 2023-12-15 ENCOUNTER — Ambulatory Visit (INDEPENDENT_AMBULATORY_CARE_PROVIDER_SITE_OTHER): Payer: MEDICAID

## 2023-12-15 VITALS — BP 108/73 | HR 70 | Temp 97.9°F | Resp 16 | Ht 73.0 in | Wt 280.6 lb

## 2023-12-15 DIAGNOSIS — D509 Iron deficiency anemia, unspecified: Secondary | ICD-10-CM | POA: Diagnosis not present

## 2023-12-15 MED ORDER — SODIUM CHLORIDE 0.9 % IV SOLN
510.0000 mg | Freq: Once | INTRAVENOUS | Status: AC
  Administered 2023-12-15: 510 mg via INTRAVENOUS
  Filled 2023-12-15: qty 17

## 2023-12-15 NOTE — Progress Notes (Signed)
 Diagnosis: Iron Deficiency Anemia  Provider:  Praveen Mannam MD  Procedure: IV Infusion  IV Type: Peripheral, IV Location: L Hand  Feraheme (Ferumoxytol), Dose: 510 mg  Infusion Start Time: 1216  pm  Infusion Stop Time: 1232  pm  Post Infusion IV Care: Observation period completed and Peripheral IV Discontinued  Discharge: Condition: Good, Destination: Home . AVS Provided  Performed by:  Lauran Pollard, LPN

## 2023-12-22 ENCOUNTER — Ambulatory Visit (INDEPENDENT_AMBULATORY_CARE_PROVIDER_SITE_OTHER): Payer: MEDICAID

## 2023-12-22 VITALS — BP 122/80 | HR 76 | Temp 98.1°F | Resp 16 | Ht 73.0 in | Wt 282.4 lb

## 2023-12-22 DIAGNOSIS — D509 Iron deficiency anemia, unspecified: Secondary | ICD-10-CM | POA: Diagnosis not present

## 2023-12-22 MED ORDER — SODIUM CHLORIDE 0.9 % IV SOLN
510.0000 mg | Freq: Once | INTRAVENOUS | Status: AC
  Administered 2023-12-22: 510 mg via INTRAVENOUS
  Filled 2023-12-22: qty 17

## 2023-12-22 NOTE — Progress Notes (Signed)
 Diagnosis: Iron Deficiency Anemia  Provider:  Praveen Mannam MD  Procedure: IV Infusion  IV Type: Peripheral, IV Location: R Hand  Feraheme (Ferumoxytol ), Dose: 510 mg  Infusion Start Time: 1438  Infusion Stop Time: 1500  Post Infusion IV Care: Observation period completed and Peripheral IV Discontinued  Discharge: Condition: Good, Destination: Home . AVS Declined  Performed by:  Keirstin Musil, RN

## 2023-12-29 ENCOUNTER — Encounter: Payer: Self-pay | Admitting: Psychiatry

## 2024-01-02 ENCOUNTER — Encounter: Payer: Self-pay | Admitting: Psychiatry

## 2024-01-02 ENCOUNTER — Inpatient Hospital Stay (HOSPITAL_BASED_OUTPATIENT_CLINIC_OR_DEPARTMENT_OTHER): Payer: MEDICAID | Admitting: Psychiatry

## 2024-01-02 ENCOUNTER — Inpatient Hospital Stay: Payer: MEDICAID | Attending: Psychiatry

## 2024-01-02 VITALS — BP 120/74 | HR 60 | Temp 98.2°F | Resp 18 | Ht 72.0 in | Wt 283.2 lb

## 2024-01-02 DIAGNOSIS — N83202 Unspecified ovarian cyst, left side: Secondary | ICD-10-CM | POA: Diagnosis not present

## 2024-01-02 DIAGNOSIS — Z113 Encounter for screening for infections with a predominantly sexual mode of transmission: Secondary | ICD-10-CM | POA: Diagnosis not present

## 2024-01-02 DIAGNOSIS — Z87891 Personal history of nicotine dependence: Secondary | ICD-10-CM | POA: Insufficient documentation

## 2024-01-02 DIAGNOSIS — F319 Bipolar disorder, unspecified: Secondary | ICD-10-CM | POA: Insufficient documentation

## 2024-01-02 DIAGNOSIS — E119 Type 2 diabetes mellitus without complications: Secondary | ICD-10-CM | POA: Insufficient documentation

## 2024-01-02 DIAGNOSIS — Z809 Family history of malignant neoplasm, unspecified: Secondary | ICD-10-CM | POA: Diagnosis not present

## 2024-01-02 DIAGNOSIS — Z7985 Long-term (current) use of injectable non-insulin antidiabetic drugs: Secondary | ICD-10-CM | POA: Diagnosis not present

## 2024-01-02 DIAGNOSIS — Z791 Long term (current) use of non-steroidal anti-inflammatories (NSAID): Secondary | ICD-10-CM | POA: Insufficient documentation

## 2024-01-02 DIAGNOSIS — F4310 Post-traumatic stress disorder, unspecified: Secondary | ICD-10-CM | POA: Diagnosis not present

## 2024-01-02 DIAGNOSIS — D069 Carcinoma in situ of cervix, unspecified: Secondary | ICD-10-CM

## 2024-01-02 DIAGNOSIS — I10 Essential (primary) hypertension: Secondary | ICD-10-CM | POA: Diagnosis not present

## 2024-01-02 DIAGNOSIS — Z79899 Other long term (current) drug therapy: Secondary | ICD-10-CM | POA: Diagnosis not present

## 2024-01-02 DIAGNOSIS — F419 Anxiety disorder, unspecified: Secondary | ICD-10-CM | POA: Insufficient documentation

## 2024-01-02 DIAGNOSIS — G8929 Other chronic pain: Secondary | ICD-10-CM | POA: Insufficient documentation

## 2024-01-02 LAB — HIV ANTIBODY (ROUTINE TESTING W REFLEX): HIV Screen 4th Generation wRfx: NONREACTIVE

## 2024-01-02 NOTE — Progress Notes (Signed)
 GYNECOLOGIC ONCOLOGY NEW PATIENT CONSULTATION  Date of Service: 01/02/2024 Referring Provider: Kiki Pelton, MD   ASSESSMENT AND PLAN: Diane Rice is a 38 y.o. 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  Reviewed the nature of CIN-3.  Reviewed the risk of progression to malignancy without treatment.  Reviewed patient's treatment course with prior LEEP with positive margins and persistent CIN-3 on repeat colposcopy.  Recommend repeat excisional procedure.  Reviewed risks of second excisional procedure in terms of obstetrical outcomes, particularly preterm delivery.  Alternatively, reviewed the risk of elimination of fertility if progression to cervical cancer that requires more radical procedure.  Strongly recommend excisional procedure to optimize both cervical dysplasia/cancer outcomes as well as to maintain as much cervical length as possible compared to what may be required in the setting of a cervical cancer.  Patient is more understanding and accepting of this at this time.  Will refer back to her gynecologist to perform excisional procedure.  Otherwise, patient has some evidence of mild anterior and apical prolapse on exam.  Reviewed that this may at times be contributing to pressure in her pelvis that she has been experiencing.  Additionally, patient had recent pelvic ultrasound with complex left ovarian cyst, possible corpus luteum or hemorrhagic cyst.  Recommendation was for follow-up ultrasound in 6 to 12 weeks to document resolution.  This is reasonable.  Will defer this to her gynecologist.  Per pt request, desires STD screening. Has prior screening negative but has been a few years. GC/CT ordered and HIV ordered. Will follow-up results and notify pt.    A copy of this note was sent to the patient's referring provider.  Derrel Flies, MD Gynecologic Oncology   Medical Decision Making I personally spent  TOTAL 60 minutes  face-to-face and non-face-to-face in the care of this patient, which includes all pre, intra, and post visit time on the date of service.   ------------  CC: CIN3  HISTORY OF PRESENT ILLNESS:  Diane Rice is a 38 y.o. woman who is seen in consultation at the request of Kiki Pelton, MD for evaluation of CIN3.  Patient has a history of cervical dysplasia for which she previously underwent a LEEP procedure on 01/11/2023 with CIN-3 with positive endocervical and ectocervical margins, ECC negative.  Repeat colposcopy on 09/15/2023 showed CIN-3 and biopsies at 12 and 7:00 and CIN-3 in the ECC.  Patient was subsequently seen in the emergency room on 12/03/23 for abdominal pain.  A pelvic ultrasound showed a complex left ovarian cyst with internal echoes measuring 1.7 x 1.1 x 1.4 cm, possible corpus luteal or hemorrhagic cyst.  hCG negative.  She had follow-up with her OB/GYN on 12/06/2023 who reiterated the importance of treatment for her high-grade cervical dysplasia.  At that time patient was unsure about proceeding with reexcision.  She has also been following with the [facility] for infertility.  She was recommended to schedule her her HSG.  She was also offered diagnostic laparoscopy with chromotubation to assess for endometriosis and tubal patency.  Patient endorses a history as above.  She reported that she requested this consultation to ensure that an additional excision was the best course of action.  She has not yet scheduled her appointment for HSG as above.  She reports monthly regular cycles with intermenstrual spotting and spotting after intercourse.  Does not recall being told that she had abnormal Pap smears prior to 2023.  She reports that her family-planning goals includes "to have as  many children's as God will let me have" but ideally 1-2 more.   TREATMENT HISTORY: 07/14/22: Pap HSIL, HPV 16+ 01/11/23: LEEP CIN3 positive endocervical, ectocervical margin; ECC  benign 09/15/23: Colpo 12 and 7 o'clock biopsy CIN3, ECC CIN3   PAST MEDICAL HISTORY: Past Medical History:  Diagnosis Date   Anemia    Anxiety    Asthma    Back pain, chronic    Bipolar disorder (HCC)    Diabetes type 2 (HCC)    Hypertension    Muscle spasm    PTSD (post-traumatic stress disorder)     PAST SURGICAL HISTORY: Past Surgical History:  Procedure Laterality Date   LEEP  01/11/2023   MOLE REMOVAL     WISDOM TOOTH EXTRACTION      OB/GYN HISTORY: OB History  Gravida Para Term Preterm AB Living  2 2 2  0 0 2  SAB IAB Ectopic Multiple Live Births  0 0 0 0 2    # Outcome Date GA Lbr Len/2nd Weight Sex Type Anes PTL Lv  2 Term 2010     Vag-Spont   LIV  1 Term 2003     Vag-Spont   LIV      Age at menarche: 29 Age at menopause: n/a Hx of HRT: no Hx of STI: gonorrhea, chlamydia; HIV negative 03/2021 Last pap: see above History of abnormal pap smears: see above  SCREENING STUDIES:  Last mammogram: n/a Last colonoscopy: n/a  MEDICATIONS:  Current Outpatient Medications:    DULoxetine  (CYMBALTA ) 20 MG capsule, Take 2 capsules (40 mg total) by mouth daily., Disp: 60 capsule, Rfl: 2   Semaglutide , 1 MG/DOSE, 4 MG/3ML SOPN, Inject 1 mg into the skin once a week., Disp: 3 mL, Rfl: 1   ARIPiprazole  (ABILIFY ) 5 MG tablet, Take 1/2 tab daily for 1 week and than full tab daily (Patient not taking: Reported on 12/29/2023), Disp: 30 tablet, Rfl: 0   azithromycin  (ZITHROMAX ) 250 MG tablet, Take 1 tablet (250 mg total) by mouth daily. Take first 2 tablets together, then 1 every day until finished. (Patient not taking: Reported on 12/29/2023), Disp: 6 tablet, Rfl: 0   diclofenac  Sodium (VOLTAREN ) 1 % GEL, Apply 2 g topically 4 (four) times daily. (Patient not taking: Reported on 09/15/2023), Disp: 100 g, Rfl: 5   ferrous sulfate  325 (65 FE) MG EC tablet, Take 1 tablet (325 mg total) by mouth every other day. (Patient not taking: Reported on 09/15/2023), Disp: 15 tablet, Rfl: 11    fluticasone  (FLONASE ) 50 MCG/ACT nasal spray, Place 1 spray into both nostrils daily. (Patient not taking: Reported on 12/29/2023), Disp: 64 mL, Rfl: 3   guaiFENesin -dextromethorphan (ROBITUSSIN DM) 100-10 MG/5ML syrup, Take 5 mLs by mouth every 4 (four) hours as needed for cough. (Patient not taking: Reported on 09/15/2023), Disp: 118 mL, Rfl: 0   lamoTRIgine  (LAMICTAL ) 25 MG tablet, Take 1 tablet (25 mg total) by mouth daily for 14 days, THEN 2 tablets (50 mg total) daily. (Patient not taking: Reported on 12/29/2023), Disp: 60 tablet, Rfl: 1   naproxen  (NAPROSYN ) 500 MG tablet, Take 1 tablet (500 mg total) by mouth 2 (two) times daily. (Patient not taking: Reported on 12/29/2023), Disp: 30 tablet, Rfl: 0   promethazine -dextromethorphan (PROMETHAZINE -DM) 6.25-15 MG/5ML syrup, Take 5 mLs by mouth 4 (four) times daily as needed for cough. (Patient not taking: Reported on 12/29/2023), Disp: 118 mL, Rfl: 0   Semaglutide ,0.25 or 0.5MG /DOS, 2 MG/3ML SOPN, Inject 0.25 mg into the skin once a week.  Take for 4 weeks, if tolerating well, increase to 0.5 mg weekly injection. (Patient not taking: Reported on 12/29/2023), Disp: 3 mL, Rfl: 1   tranexamic acid  (LYSTEDA ) 650 MG TABS tablet, Take 2 tablets (1,300 mg total) by mouth 3 (three) times daily. Take during menses for a maximum of five days (Patient not taking: Reported on 12/29/2023), Disp: 30 tablet, Rfl: 6   traZODone  (DESYREL ) 50 MG tablet, Take 1 tablet (50 mg total) by mouth at bedtime. (Patient not taking: Reported on 12/29/2023), Disp: 30 tablet, Rfl: 1  ALLERGIES: Allergies  Allergen Reactions   Hydrocodone -Acetaminophen  Itching and Other (See Comments)    hallucinations    FAMILY HISTORY: Family History  Problem Relation Age of Onset   Depression Mother    Bipolar disorder Sister    Cancer Maternal Grandfather        Unsure what type of cancer   Breast cancer Neg Hx    Prostate cancer Neg Hx    Ovarian cancer Neg Hx    Endometrial cancer Neg Hx     Colon cancer Neg Hx    Pancreatic cancer Neg Hx     SOCIAL HISTORY: Social History   Socioeconomic History   Marital status: Married    Spouse name: jamal   Number of children: 2   Years of education: Not on file   Highest education level: 9th grade  Occupational History   Not on file  Tobacco Use   Smoking status: Former    Current packs/day: 0.00    Average packs/day: 0.5 packs/day for 13.0 years (6.5 ttl pk-yrs)    Types: Cigarettes    Start date: 06/30/1996    Quit date: 06/30/2009    Years since quitting: 14.5   Smokeless tobacco: Never  Vaping Use   Vaping status: Never Used  Substance and Sexual Activity   Alcohol use: Yes    Alcohol/week: 1.0 - 2.0 standard drink of alcohol    Types: 1 - 2 Cans of beer per week    Comment: socially   Drug use: No   Sexual activity: Yes    Birth control/protection: None  Other Topics Concern   Not on file  Social History Narrative   Lives with husband and children. Used to work in [employer] but has not been working lately   Social Drivers of Corporate investment banker Strain: High Risk (07/09/2022)   Overall Financial Resource Strain (CARDIA)    Difficulty of Paying Living Expenses: Very hard  Food Insecurity: Food Insecurity Present (01/17/2023)   Hunger Vital Sign    Worried About Running Out of Food in the Last Year: Sometimes true    Ran Out of Food in the Last Year: Sometimes true  Transportation Needs: Unmet Transportation Needs (01/17/2023)   PRAPARE - Administrator, Civil Service (Medical): Yes    Lack of Transportation (Non-Medical): Yes  Physical Activity: Inactive (07/09/2022)   Exercise Vital Sign    Days of Exercise per Week: 0 days    Minutes of Exercise per Session: 0 min  Stress: Stress Concern Present (07/09/2022)   [employer] of Occupational Health - Occupational Stress Questionnaire    Feeling of Stress : Rather much  Social Connections: Moderately Isolated (03/22/2023)    Social Connection and Isolation Panel [NHANES]    Frequency of Communication with Friends and Family: Twice a week    Frequency of Social Gatherings with Friends and Family: Never    Attends Religious Services: More  than 4 times per year    Active Member of Clubs or Organizations: No    Attends Banker Meetings: Never    Marital Status: Living with partner  Intimate Partner Violence: Not At Risk (01/17/2023)   Humiliation, Afraid, Rape, and Kick questionnaire    Fear of Current or Ex-Partner: No    Emotionally Abused: No    Physically Abused: No    Sexually Abused: No    REVIEW OF SYSTEMS: New patient intake form was reviewed.  Complete 10-system review is negative except for the following: Shortness of breath, constipation, joint pain, rash, decreased concentration, abdominal distention, pelvic pain, back pain, anxiety, fatigue, mouth sores, chest pain, abdominal pain, pain with intercourse, vaginal bleeding, dizziness, ringing in ears, cough, leg swelling, hot flashes, problem with walking, hemorrhoids  PHYSICAL EXAM: BP 120/74 (BP Location: Left Arm, Patient Position: Sitting)   Pulse 60   Temp 98.2 F (36.8 C) (Oral)   Resp 18   Ht 6' (1.829 m)   Wt 283 lb 3.2 oz (128.5 kg)   LMP 11/18/2023 (Approximate)   SpO2 100%   BMI 38.41 kg/m  Constitutional: No acute distress. Neuro/Psych: Alert, oriented.  Head and Neck: Normocephalic, atraumatic. Neck symmetric without masses. Sclera anicteric.  Respiratory: Normal work of breathing. Clear to auscultation bilaterally. Cardiovascular: Regular rate and rhythm, no murmurs, rubs, or gallops. Abdomen: Normoactive bowel sounds. Soft, non-distended, non-tender to palpation.  Extremities: Grossly normal range of motion. Warm, well perfused. No edema bilaterally. Skin: No rashes or lesions. Lymphatic: No inguinal adenopathy. Genitourinary: External genitalia without lesions. Urethral meatus without lesions or prolapse. On  speculum exam, some anterior and apical prolapse noted. Cervix grossly normal appearing. Swab collected for GC/CT with some friability note. Bimanual exam reveals normal cervix, multiparous, long residual vaginal length. chaperoned by Vira Grieves, NP  COLPOSCOPY PROCEDURE NOTE  Procedure Details: After appropriate verbal informed consent was obtained, a timeout was performed. A sterile speculum was placed in the vagina. Acetic acid was applied to the cervix and inspected with the colposcope with the findings as noted below. The cervix was then rinsed with water. The speculum was removed from the vagina. The patient tolerated the procedure well.   Adequate Exam: transformation zone not visualized  Biopsy Specimen: none  Condition: Stable. Patient tolerated procedure well.  Complications: None  Findings: acetowhite changes of the posterior cervix from approximatly 6-8 o'clock with few areas of punctate vessels   Colposcopic Impression: CIN3   LABORATORY AND RADIOLOGIC DATA: Outside medical records were reviewed to synthesize the above history, along with the history and physical obtained during the visit.  Outside laboratory, pathology, and imaging reports were reviewed, with pertinent results below.  I personally reviewed the outside images.  WBC  Date Value Ref Range Status  12/12/2023 6.8 4.0 - 10.5 K/uL Final   Hemoglobin  Date Value Ref Range Status  12/12/2023 7.6 (L) 12.0 - 15.0 g/dL Final    Comment:    Reticulocyte Hemoglobin testing may be clinically indicated, consider ordering this additional test [id number]   08/15/2023 7.4 (L) 11.1 - 15.9 g/dL Final   HCT  Date Value Ref Range Status  12/12/2023 26.5 (L) 36.0 - 46.0 % Final   Hematocrit  Date Value Ref Range Status  08/15/2023 25.7 (L) 34.0 - 46.6 % Final   Platelets  Date Value Ref Range Status  12/12/2023 448 (H) 150 - 400 K/uL Final  08/15/2023 413 150 - 450 x10E3/uL Final   Creatinine, Ser  Date  Value Ref Range Status  12/02/2023 0.97 0.44 - 1.00 mg/dL Final   AST  Date Value Ref Range Status  12/02/2023 29 15 - 41 U/L Final   ALT  Date Value Ref Range Status  12/02/2023 28 0 - 44 U/L Final   Diagnosis  Date Value Ref Range Status  07/14/2022 (A)  Final   - High grade squamous intraepithelial lesion (HSIL)   Surgical pathology (01/11/23): A. 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 ENDOCERVIX, CURETTAGE:  Benign endocervical epithelium  Superficial fragments of proliferative endometrium with stromal  breakdown and reactive metaplasia consistent with bleeding    Surgical pathology (09/15/23): A. CERVIX, 12 AND 7 O'CLOCK, BIOPSY:       High-grade squamous intraepithelial lesion (HSIL / CIN 3).   B. ENDOCERVIX, CURETTAGE:       High-grade squamous intraepithelial lesion (HSIL / CIN 3).       Endocervical glands without significant diagnostic alteration.       Negative for glandular hyperplasia and malignancy.    US  Transvaginal Non-OB 12/03/2023 Narrative CLINICAL DATA:  Lower abdominal pain and vaginal bleeding.  EXAM: TRANSABDOMINAL AND TRANSVAGINAL ULTRASOUND OF PELVIS  DOPPLER ULTRASOUND OF OVARIES  TECHNIQUE: Both transabdominal and transvaginal ultrasound examinations of the pelvis were performed. Transabdominal technique was performed for global imaging of the pelvis including uterus, ovaries, adnexal regions, and pelvic cul-de-sac.  It was necessary to proceed with endovaginal exam following the transabdominal exam to visualize the endometrium and ovaries. Color and duplex Doppler ultrasound was utilized to evaluate blood flow to the ovaries.  COMPARISON:  06/07/2022.  FINDINGS: Uterus  Measurements: 9.0 x 5.7 x 6.2 cm = volume: 166.44 mL. No fibroids  or other mass visualized.  Endometrium  Thickness: 13.7 mm.  Endometrium appears heterogeneous.  Right ovary  Measurements: 3.2 x 1.7 x 1.8 cm = volume: 5.0 mL. Normal appearance/no adnexal mass.  Left ovary  Measurements: 3.7 x 2.7 x 2.7 cm = volume: 14.1 mL. A complex lesion in the left ovary with internal echoes with adjacent vascularity is noted measuring 1.7 x 1.1 x 1.4 cm.  Pulsed Doppler evaluation of both ovaries demonstrates normal low-resistance arterial and venous waveforms.  Other findings  A trace amount of free fluid is noted in the pelvis.  IMPRESSION: 1. No evidence of ovarian torsion. 2. Complex lesion in the left ovary with internal echoes measuring 1.7 x 1.1 x 1.4 cm with adjacent vascularity, possible corpus luteal or hemorrhagic cyst. The possibility of other lesion can not be completely excluded. Follow-up ultrasound is recommended in 6-12 weeks to document resolution. 3. Slightly heterogeneous endometrium.   Electronically Signed By: Wyvonnia Heimlich M.D. On: 12/03/2023 03:43

## 2024-01-02 NOTE — Addendum Note (Signed)
 Addended by: Derrel Flies on: 01/02/2024 12:52 PM   Modules accepted: Level of Service

## 2024-01-02 NOTE — Patient Instructions (Addendum)
 We will contact you with the results of your testing from today.  We will also send your note from today's visit to your gynecologist. Recommend that you call to schedule follow-up to discuss the cervical excision procedure and get this scheduled.  We discussed that the precancer (CIN3) if left untreated could turn into a cervical cancer which would require more extensive surgery that could eliminate your option for fertility. Alternatively, an excisional procedure may prevent the precancer from turning into a cancer. The primary risk of a second excisional procedure would be increased risk of a preterm delivery.

## 2024-01-03 ENCOUNTER — Encounter: Payer: Self-pay | Admitting: Psychiatry

## 2024-01-04 LAB — CERVICOVAGINAL ANCILLARY ONLY
Chlamydia: NEGATIVE
Comment: NEGATIVE
Comment: NEGATIVE
Comment: NORMAL
Neisseria Gonorrhea: NEGATIVE
Trichomonas: NEGATIVE

## 2024-01-05 MED ORDER — SEMAGLUTIDE (1 MG/DOSE) 4 MG/3ML ~~LOC~~ SOPN: 1.0000 mg | PEN_INJECTOR | SUBCUTANEOUS | 1 refills | Status: DC

## 2024-01-05 NOTE — Telephone Encounter (Unsigned)
 Copied from CRM [id number]. Topic: Clinical - Medication Refill >> Jan 05, 2024 12:27 PM Lenon Radar A wrote: Medication:  Semaglutide , 1 MG/DOSE, 4 MG/3ML SOPN  Has the patient contacted their pharmacy? Yes (Agent: If no, request that the patient contact the pharmacy for the refill. If patient does not wish to contact the pharmacy document the reason why and proceed with request.) (Agent: If yes, when and what did the pharmacy advise?)  Patient called pharmacy and was advised that they do not prescribe a 1.25 dosage. Patient is in need of medication and cannot get it filled due to the discrepancy. Please contact patient or fill for patient.   This is the patient's preferred pharmacy:  [id number] Jonette Nestle, [place] - [address] AT [facility] OF [address] & [facility] [address] [place] [address] Phone: [phone] Fax: [phone]  Is this the correct pharmacy for this prescription? Yes If no, delete pharmacy and type the correct one.   Has the prescription been filled recently? Yes  Is the patient out of the medication? Yes  Has the patient been seen for an appointment in the last year OR does the patient have an upcoming appointment? Yes  Can we respond through MyChart? Yes  Agent: Please be advised that Rx refills may take up to 3 business days. We ask that you follow-up with your pharmacy.

## 2024-01-09 ENCOUNTER — Encounter: Payer: Self-pay | Admitting: Psychiatry

## 2024-01-13 NOTE — Telephone Encounter (Signed)
 Copied from CRM [id number]. Topic: Clinical - Prescription Issue >> Jan 06, 2024  3:59 PM [name] wrote: Reason for CRM: Patient pharmacy received prescription for Semaglutide , 1 MG/DOSE, 4 MG/3ML SOPN, Patient states it was supposed to be 2mg , patient would like to speak to Dr, please call patient at [phone] >> Jan 12, 2024 12:28 PM [name] wrote: Attn: Nurse [name]   Pt Diane Rice is calling back in regards to the status of her medication: Semaglutide ,0.25 or 0.5MG /DOS, 2 MG/3ML SOPN  Please advise.

## 2024-01-16 ENCOUNTER — Telehealth: Payer: Self-pay | Admitting: *Deleted

## 2024-01-16 NOTE — Telephone Encounter (Signed)
 Copied from CRM [id number]. Topic: Clinical - Prescription Issue >> Jan 06, 2024  3:59 PM [name] wrote: Reason for CRM: Patient pharmacy received prescription for Semaglutide , 1 MG/DOSE, 4 MG/3ML SOPN, Patient states it was supposed to be 2mg , patient would like to speak to Dr, please call patient at [phone] >> Jan 12, 2024 12:28 PM [name] wrote: Attn: Nurse [name]   Pt Mick is calling back in regards to the status of her medication: Semaglutide ,0.25 or 0.5MG /DOS, 2 MG/3ML SOPN  Please advise.  >> Jan 09, 2024  8:57 AM Nurse Dayla Eva wrote: Will forward to Dr. Jari Merles.

## 2024-02-06 ENCOUNTER — Ambulatory Visit: Payer: MEDICAID | Admitting: Obstetrics and Gynecology

## 2024-02-08 ENCOUNTER — Encounter: Payer: MEDICAID | Admitting: Student

## 2024-02-08 NOTE — Progress Notes (Deleted)
 CC: ***  HPI:  Ms.Jane Sacra is a 38 y.o. female living with a history stated below and presents today for ***. Please see problem based assessment and plan for additional details.  Past Medical History:  Diagnosis Date   Anemia    Anxiety    Asthma    Back pain, chronic    Bipolar disorder (HCC)    Diabetes type 2 (HCC)    Hypertension    Muscle spasm    PTSD (post-traumatic stress disorder)     Current Outpatient Medications on File Prior to Visit  Medication Sig Dispense Refill   ARIPiprazole  (ABILIFY ) 5 MG tablet Take 1/2 tab daily for 1 week and than full tab daily (Patient not taking: Reported on 12/29/2023) 30 tablet 0   azithromycin  (ZITHROMAX ) 250 MG tablet Take 1 tablet (250 mg total) by mouth daily. Take first 2 tablets together, then 1 every day until finished. (Patient not taking: Reported on 12/29/2023) 6 tablet 0   diclofenac  Sodium (VOLTAREN ) 1 % GEL Apply 2 g topically 4 (four) times daily. (Patient not taking: Reported on 09/15/2023) 100 g 5   DULoxetine  (CYMBALTA ) 20 MG capsule Take 2 capsules (40 mg total) by mouth daily. 60 capsule 2   ferrous sulfate  325 (65 FE) MG EC tablet Take 1 tablet (325 mg total) by mouth every other day. (Patient not taking: Reported on 09/15/2023) 15 tablet 11   fluticasone  (FLONASE ) 50 MCG/ACT nasal spray Place 1 spray into both nostrils daily. (Patient not taking: Reported on 12/29/2023) 64 mL 3   guaiFENesin -dextromethorphan (ROBITUSSIN DM) 100-10 MG/5ML syrup Take 5 mLs by mouth every 4 (four) hours as needed for cough. (Patient not taking: Reported on 09/15/2023) 118 mL 0   lamoTRIgine  (LAMICTAL ) 25 MG tablet Take 1 tablet (25 mg total) by mouth daily for 14 days, THEN 2 tablets (50 mg total) daily. (Patient not taking: Reported on 12/29/2023) 60 tablet 1   naproxen  (NAPROSYN ) 500 MG tablet Take 1 tablet (500 mg total) by mouth 2 (two) times daily. (Patient not taking: Reported on 12/29/2023) 30 tablet 0   promethazine -dextromethorphan  (PROMETHAZINE -DM) 6.25-15 MG/5ML syrup Take 5 mLs by mouth 4 (four) times daily as needed for cough. (Patient not taking: Reported on 12/29/2023) 118 mL 0   Semaglutide , 1 MG/DOSE, 4 MG/3ML SOPN Inject 1 mg into the skin once a week. 3 mL 1   Semaglutide ,0.25 or 0.5MG /DOS, 2 MG/3ML SOPN Inject 0.25 mg into the skin once a week. Take for 4 weeks, if tolerating well, increase to 0.5 mg weekly injection. (Patient not taking: Reported on 12/29/2023) 3 mL 1   tranexamic acid  (LYSTEDA ) 650 MG TABS tablet Take 2 tablets (1,300 mg total) by mouth 3 (three) times daily. Take during menses for a maximum of five days (Patient not taking: Reported on 12/29/2023) 30 tablet 6   traZODone  (DESYREL ) 50 MG tablet Take 1 tablet (50 mg total) by mouth at bedtime. (Patient not taking: Reported on 12/29/2023) 30 tablet 1   No current facility-administered medications on file prior to visit.    Family History  Problem Relation Age of Onset   Depression Mother    Bipolar disorder Sister    Cancer Maternal Grandfather        Unsure what type of cancer   Breast cancer Neg Hx    Prostate cancer Neg Hx    Ovarian cancer Neg Hx    Endometrial cancer Neg Hx    Colon cancer Neg Hx  Pancreatic cancer Neg Hx     Social History   Socioeconomic History   Marital status: Married    Spouse name: jamal   Number of children: 2   Years of education: Not on file   Highest education level: 9th grade  Occupational History   Not on file  Tobacco Use   Smoking status: Former    Current packs/day: 0.00    Average packs/day: 0.5 packs/day for 13.0 years (6.5 ttl pk-yrs)    Types: Cigarettes    Start date: 06/30/1996    Quit date: 06/30/2009    Years since quitting: 14.6   Smokeless tobacco: Never  Vaping Use   Vaping status: Never Used  Substance and Sexual Activity   Alcohol use: Yes    Alcohol/week: 1.0 - 2.0 standard drink of alcohol    Types: 1 - 2 Cans of beer per week    Comment: socially   Drug use: No   Sexual  activity: Yes    Birth control/protection: None  Other Topics Concern   Not on file  Social History Narrative   Lives with husband and children. Used to work in [employer] but has not been working lately   Social Drivers of Corporate investment banker Strain: High Risk (07/09/2022)   Overall Financial Resource Strain (CARDIA)    Difficulty of Paying Living Expenses: Very hard  Food Insecurity: Food Insecurity Present (01/17/2023)   Hunger Vital Sign    Worried About Running Out of Food in the Last Year: Sometimes true    Ran Out of Food in the Last Year: Sometimes true  Transportation Needs: Unmet Transportation Needs (01/17/2023)   PRAPARE - Administrator, Civil Service (Medical): Yes    Lack of Transportation (Non-Medical): Yes  Physical Activity: Inactive (07/09/2022)   Exercise Vital Sign    Days of Exercise per Week: 0 days    Minutes of Exercise per Session: 0 min  Stress: Stress Concern Present (07/09/2022)   [employer] of Occupational Health - Occupational Stress Questionnaire    Feeling of Stress : Rather much  Social Connections: Moderately Isolated (03/22/2023)   Social Connection and Isolation Panel [NHANES]    Frequency of Communication with Friends and Family: Twice a week    Frequency of Social Gatherings with Friends and Family: Never    Attends Religious Services: More than 4 times per year    Active Member of [employer] or Organizations: No    Attends Banker Meetings: Never    Marital Status: Living with partner  Intimate Partner Violence: Not At Risk (01/17/2023)   Humiliation, Afraid, Rape, and Kick questionnaire    Fear of Current or Ex-Partner: No    Emotionally Abused: No    Physically Abused: No    Sexually Abused: No    Review of Systems: ROS negative except for what is noted on the assessment and plan.  There were no vitals filed for this visit.  Physical Exam  Physical Exam: Constitutional: well-appearing  *** sitting in ***, in no acute distress HENT: normocephalic atraumatic, mucous membranes moist Eyes: conjunctiva non-erythematous Neck: supple Cardiovascular: regular rate and rhythm, no m/r/g Pulmonary/Chest: normal work of breathing on room air, lungs clear to auscultation bilaterally Abdominal: soft, non-tender, non-distended MSK: *** Neurological: alert & oriented x 3, 5/5 strength in bilateral upper and lower extremities, normal gait Skin: warm and dry Psych: ***  Assessment & Plan:   No problem-specific Assessment & Plan notes found for  this encounter. PMH:  IDA -??CBC. Last Hgb 7.6 in 11/2023. -IV iron infusions s/p 2 visits 4/17 and 4/24 -  T2DM Status: controlled. T2DM with last A1c of 6.2 in 07/2023.  Patient's A1c today is***.  Currently taking Ozempic  1 mg.   Plan -Continue *** -A1c *** -Ophthalmology referral: *** referral today  -Urine ACR 07/2023 normal  -LDL N/A ***need lipid panel   Vitamin D?*** 20 in 2011 *** repeat today  CARE: HCV, Tdap, PCV 20   Patient {GC/GE:[id number]::discussed with,seen with} Dr. {[id number]::WJXBJYNW,G. Hoffman,Mullen,Narendra,Vincent,Guilloud,Lau,Machen}  Jearldine Mina, D.O. [facility] Internal Medicine, PGY-2 Phone: [phone] Date 02/08/2024 Time 8:16 AM

## 2024-02-13 ENCOUNTER — Telehealth: Payer: MEDICAID | Admitting: Family Medicine

## 2024-02-13 DIAGNOSIS — R102 Pelvic and perineal pain: Secondary | ICD-10-CM

## 2024-02-13 NOTE — Progress Notes (Signed)

## 2024-03-07 ENCOUNTER — Encounter: Payer: Self-pay | Admitting: Obstetrics and Gynecology

## 2024-05-02 ENCOUNTER — Ambulatory Visit: Payer: MEDICAID | Admitting: Obstetrics and Gynecology

## 2024-05-02 ENCOUNTER — Other Ambulatory Visit: Payer: Self-pay

## 2024-05-27 ENCOUNTER — Telehealth: Payer: MEDICAID | Admitting: Family Medicine

## 2024-05-27 ENCOUNTER — Encounter: Payer: Self-pay | Admitting: Obstetrics and Gynecology

## 2024-05-27 ENCOUNTER — Telehealth: Payer: MEDICAID | Admitting: Physician Assistant

## 2024-05-27 DIAGNOSIS — N939 Abnormal uterine and vaginal bleeding, unspecified: Secondary | ICD-10-CM

## 2024-05-27 NOTE — Patient Instructions (Signed)
 Diane Rice, thank you for joining Harlene PEDLAR Ward, PA-C for today's virtual visit.  While this provider is not your primary care provider (PCP), if your PCP is located in our provider database this encounter information will be shared with them immediately following your visit.   A [facility] MyChart account gives you access to today's visit and all your visits, tests, and labs performed at [facility]  click here if you don't have a  MyChart account or go to [email or web address]  Consent: (Patient) Diane Rice provided verbal consent for this virtual visit at the beginning of the encounter.  Current Medications:  Current Outpatient Medications:    ARIPiprazole  (ABILIFY ) 5 MG tablet, Take 1/2 tab daily for 1 week and than full tab daily (Patient not taking: Reported on 12/29/2023), Disp: 30 tablet, Rfl: 0   azithromycin  (ZITHROMAX ) 250 MG tablet, Take 1 tablet (250 mg total) by mouth daily. Take first 2 tablets together, then 1 every day until finished. (Patient not taking: Reported on 12/29/2023), Disp: 6 tablet, Rfl: 0   diclofenac  Sodium (VOLTAREN ) 1 % GEL, Apply 2 g topically 4 (four) times daily. (Patient not taking: Reported on 09/15/2023), Disp: 100 g, Rfl: 5   DULoxetine  (CYMBALTA ) 20 MG capsule, Take 2 capsules (40 mg total) by mouth daily., Disp: 60 capsule, Rfl: 2   ferrous sulfate  325 (65 FE) MG EC tablet, Take 1 tablet (325 mg total) by mouth every other day. (Patient not taking: Reported on 09/15/2023), Disp: 15 tablet, Rfl: 11   fluticasone  (FLONASE ) 50 MCG/ACT nasal spray, Place 1 spray into both nostrils daily. (Patient not taking: Reported on 12/29/2023), Disp: 64 mL, Rfl: 3   guaiFENesin -dextromethorphan (ROBITUSSIN DM) 100-10 MG/5ML syrup, Take 5 mLs by mouth every 4 (four) hours as needed for cough. (Patient not taking: Reported on 09/15/2023), Disp: 118 mL, Rfl: 0   lamoTRIgine  (LAMICTAL ) 25 MG tablet, Take 1 tablet (25 mg total) by mouth  daily for 14 days, THEN 2 tablets (50 mg total) daily. (Patient not taking: Reported on 12/29/2023), Disp: 60 tablet, Rfl: 1   naproxen  (NAPROSYN ) 500 MG tablet, Take 1 tablet (500 mg total) by mouth 2 (two) times daily. (Patient not taking: Reported on 12/29/2023), Disp: 30 tablet, Rfl: 0   promethazine -dextromethorphan (PROMETHAZINE -DM) 6.25-15 MG/5ML syrup, Take 5 mLs by mouth 4 (four) times daily as needed for cough. (Patient not taking: Reported on 12/29/2023), Disp: 118 mL, Rfl: 0   Semaglutide , 1 MG/DOSE, 4 MG/3ML SOPN, Inject 1 mg into the skin once a week., Disp: 3 mL, Rfl: 1   Semaglutide ,0.25 or 0.5MG /DOS, 2 MG/3ML SOPN, Inject 0.25 mg into the skin once a week. Take for 4 weeks, if tolerating well, increase to 0.5 mg weekly injection. (Patient not taking: Reported on 12/29/2023), Disp: 3 mL, Rfl: 1   tranexamic acid  (LYSTEDA ) 650 MG TABS tablet, Take 2 tablets (1,300 mg total) by mouth 3 (three) times daily. Take during menses for a maximum of five days (Patient not taking: Reported on 12/29/2023), Disp: 30 tablet, Rfl: 6   traZODone  (DESYREL ) 50 MG tablet, Take 1 tablet (50 mg total) by mouth at bedtime. (Patient not taking: Reported on 12/29/2023), Disp: 30 tablet, Rfl: 1   Medications ordered in this encounter:  No orders of the defined types were placed in this encounter.    *If you need refills on other medications prior to your next appointment, please contact your pharmacy*  Follow-Up: Call back or seek an in-person evaluation if the symptoms worsen  or if the condition fails to improve as anticipated.  [facility] [phone]  Other Instructions Recommend calling on call provider with OBGYN.  If you develops weakness, shortness of breath, fatigue recommend in person evaluation soon with Emergency Department or Urgent Care.    If you have been instructed to have an in-person evaluation today at a local Urgent Care facility, please use the link below. It will take you to a  list of all of our available [facility], including address, phone number and hours of operation. Please do not delay care.  [facility]  If you or a family member do not have a primary care provider, use the link below to schedule a visit and establish care. When you choose a [facility] primary care physician or advanced practice provider, you gain a long-term partner in health. Find a Primary Care Provider  Learn more about [facility]'s in-office and virtual care options: [facility] - Get Care Now

## 2024-05-27 NOTE — Progress Notes (Signed)
 Virtual Visit Consent   Diane Rice, you are scheduled for a virtual visit with a [facility] provider today. Just as with appointments in the office, your consent must be obtained to participate. Your consent will be active for this visit and any virtual visit you may have with one of our providers in the next 365 days. If you have a MyChart account, a copy of this consent can be sent to you electronically.  As this is a virtual visit, video technology does not allow for your provider to perform a traditional examination. This may limit your provider's ability to fully assess your condition. If your provider identifies any concerns that need to be evaluated in person or the need to arrange testing (such as labs, EKG, etc.), we will make arrangements to do so. Although advances in technology are sophisticated, we cannot ensure that it will always work on either your end or our end. If the connection with a video visit is poor, the visit may have to be switched to a telephone visit. With either a video or telephone visit, we are not always able to ensure that we have a secure connection.  By engaging in this virtual visit, you consent to the provision of healthcare and authorize for your insurance to be billed (if applicable) for the services provided during this visit. Depending on your insurance coverage, you may receive a charge related to this service.  I need to obtain your verbal consent now. Are you willing to proceed with your visit today? [name] has provided verbal consent on 05/27/2024 for a virtual visit (video or telephone). Harlene PEDLAR Ward, PA-C  Date: 05/27/2024 11:11 AM   Virtual Visit via Video Note   I, Harlene PEDLAR Ward, connected with  Diane Rice  ([id number], [birth year]) on 05/27/24 at 11:00 AM EDT by a video-enabled telemedicine application and verified that I am speaking with the correct person using two identifiers.  Location: Patient: Virtual Visit  Location Patient: Home Provider: Virtual Visit Location Provider: Home Office   I discussed the limitations of evaluation and management by telemedicine and the availability of in person appointments. The patient expressed understanding and agreed to proceed.    History of Present Illness: Diane Rice is a 38 y.o. who identifies as a female who was assigned female at birth, and is being seen today for vaginal bleeding and cramping that started about two days ago.  LMP about 1.5 weeks ago.  She reports she is scheduled for a leep procedure with OBGYN.  She does have iron deficiency anemia and is on iron.  She denies shortness of breath or fatigue. SABRA  HPI: HPI  Problems:  Patient Active Problem List   Diagnosis Date Noted   High grade squamous intraepithelial lesion (HGSIL), grade 3 CIN, on biopsy of cervix 12/12/2023   Type 2 diabetes mellitus without complication, without long-term current use of insulin (HCC) 08/15/2023   Bipolar I disorder, most recent episode (or current) manic (HCC) 06/24/2023   Alcohol use disorder, moderate, dependence (HCC) 03/22/2023   Dysmenorrhea 07/11/2022   Migraine without status migrainosus, not intractable 07/09/2022   Homelessness 07/09/2022   Health care maintenance 07/09/2022   PTSD (post-traumatic stress disorder) 07/06/2022   Morbid obesity with BMI of 40.0-44.9, adult (HCC) 03/19/2010   Lumbar back pain 03/19/2010   Vitamin D deficiency 01/01/2010   Iron deficiency anemia 12/30/2009   Mood disorder 12/30/2009   Chronic pain syndrome 12/30/2009   Asthma 12/30/2009    Allergies:  Allergies  Allergen Reactions   Hydrocodone -Acetaminophen  Itching and Other (See Comments)    hallucinations   Medications:  Current Outpatient Medications:    ARIPiprazole  (ABILIFY ) 5 MG tablet, Take 1/2 tab daily for 1 week and than full tab daily (Patient not taking: Reported on 12/29/2023), Disp: 30 tablet, Rfl: 0   azithromycin  (ZITHROMAX ) 250 MG tablet,  Take 1 tablet (250 mg total) by mouth daily. Take first 2 tablets together, then 1 every day until finished. (Patient not taking: Reported on 12/29/2023), Disp: 6 tablet, Rfl: 0   diclofenac  Sodium (VOLTAREN ) 1 % GEL, Apply 2 g topically 4 (four) times daily. (Patient not taking: Reported on 09/15/2023), Disp: 100 g, Rfl: 5   DULoxetine  (CYMBALTA ) 20 MG capsule, Take 2 capsules (40 mg total) by mouth daily., Disp: 60 capsule, Rfl: 2   ferrous sulfate  325 (65 FE) MG EC tablet, Take 1 tablet (325 mg total) by mouth every other day. (Patient not taking: Reported on 09/15/2023), Disp: 15 tablet, Rfl: 11   fluticasone  (FLONASE ) 50 MCG/ACT nasal spray, Place 1 spray into both nostrils daily. (Patient not taking: Reported on 12/29/2023), Disp: 64 mL, Rfl: 3   guaiFENesin -dextromethorphan (ROBITUSSIN DM) 100-10 MG/5ML syrup, Take 5 mLs by mouth every 4 (four) hours as needed for cough. (Patient not taking: Reported on 09/15/2023), Disp: 118 mL, Rfl: 0   lamoTRIgine  (LAMICTAL ) 25 MG tablet, Take 1 tablet (25 mg total) by mouth daily for 14 days, THEN 2 tablets (50 mg total) daily. (Patient not taking: Reported on 12/29/2023), Disp: 60 tablet, Rfl: 1   naproxen  (NAPROSYN ) 500 MG tablet, Take 1 tablet (500 mg total) by mouth 2 (two) times daily. (Patient not taking: Reported on 12/29/2023), Disp: 30 tablet, Rfl: 0   promethazine -dextromethorphan (PROMETHAZINE -DM) 6.25-15 MG/5ML syrup, Take 5 mLs by mouth 4 (four) times daily as needed for cough. (Patient not taking: Reported on 12/29/2023), Disp: 118 mL, Rfl: 0   Semaglutide , 1 MG/DOSE, 4 MG/3ML SOPN, Inject 1 mg into the skin once a week., Disp: 3 mL, Rfl: 1   Semaglutide ,0.25 or 0.5MG /DOS, 2 MG/3ML SOPN, Inject 0.25 mg into the skin once a week. Take for 4 weeks, if tolerating well, increase to 0.5 mg weekly injection. (Patient not taking: Reported on 12/29/2023), Disp: 3 mL, Rfl: 1   tranexamic acid  (LYSTEDA ) 650 MG TABS tablet, Take 2 tablets (1,300 mg total) by mouth 3  (three) times daily. Take during menses for a maximum of five days (Patient not taking: Reported on 12/29/2023), Disp: 30 tablet, Rfl: 6   traZODone  (DESYREL ) 50 MG tablet, Take 1 tablet (50 mg total) by mouth at bedtime. (Patient not taking: Reported on 12/29/2023), Disp: 30 tablet, Rfl: 1  Observations/Objective: Patient is well-developed, well-nourished in no acute distress.  Resting comfortably at home.  Head is normocephalic, atraumatic.  No labored breathing.  Speech is clear and coherent with logical content.  Patient is alert and oriented at baseline.    Assessment and Plan: 1. Abnormal vaginal bleeding (Primary)  Recommend pt call OBGYN or be seen in [facility] at [facility] or ED if sx become worse.   Follow Up Instructions: I discussed the assessment and treatment plan with the patient. The patient was provided an opportunity to ask questions and all were answered. The patient agreed with the plan and demonstrated an understanding of the instructions.  A copy of instructions were sent to the patient via MyChart unless otherwise noted below.     The patient was advised to call back  or seek an in-person evaluation if the symptoms worsen or if the condition fails to improve as anticipated.    Harlene PEDLAR Ward, PA-C

## 2024-05-28 ENCOUNTER — Telehealth: Payer: Self-pay

## 2024-05-28 NOTE — Telephone Encounter (Signed)
 RN called pt to notify her of GYN visit scheduled for tomorrow 05/29/24 1:15 with Dr. Nicholaus, pt agreeable to this appointment.   Waddell, RN

## 2024-05-28 NOTE — Telephone Encounter (Signed)
 RN called pt to assure her staff has reached out to provider covering for Dr. Jeralyn for advisement.  Pt understanding.  She reports for past three days she has had severe cramps so painful it awakes her during sleep and she notes dark brown blood when she wipes.  Per Dr. Cleotilde, RN reached out to admin staff for scheduling provider visit in next day or two.    Waddell, RN

## 2024-05-28 NOTE — Progress Notes (Signed)
 Reached out to provider on Mychart, advised to follow up with them as conversation is still on going.

## 2024-05-28 NOTE — Telephone Encounter (Signed)
u

## 2024-05-29 ENCOUNTER — Ambulatory Visit (INDEPENDENT_AMBULATORY_CARE_PROVIDER_SITE_OTHER): Payer: MEDICAID | Admitting: Obstetrics and Gynecology

## 2024-05-29 ENCOUNTER — Other Ambulatory Visit (HOSPITAL_COMMUNITY)
Admission: RE | Admit: 2024-05-29 | Discharge: 2024-05-29 | Disposition: A | Discharge status: Home / Self Care | Payer: MEDICAID | Source: Ambulatory Visit | Attending: Obstetrics and Gynecology | Admitting: Obstetrics and Gynecology

## 2024-05-29 ENCOUNTER — Encounter: Payer: Self-pay | Admitting: Obstetrics and Gynecology

## 2024-05-29 ENCOUNTER — Other Ambulatory Visit: Payer: Self-pay

## 2024-05-29 VITALS — BP 127/85 | HR 61 | Wt 290.0 lb

## 2024-05-29 DIAGNOSIS — R102 Pelvic and perineal pain unspecified side: Secondary | ICD-10-CM

## 2024-05-29 DIAGNOSIS — N939 Abnormal uterine and vaginal bleeding, unspecified: Secondary | ICD-10-CM | POA: Diagnosis not present

## 2024-05-29 DIAGNOSIS — N898 Other specified noninflammatory disorders of vagina: Secondary | ICD-10-CM | POA: Insufficient documentation

## 2024-05-29 DIAGNOSIS — R8290 Unspecified abnormal findings in urine: Secondary | ICD-10-CM | POA: Diagnosis not present

## 2024-05-29 LAB — POCT URINALYSIS DIP (DEVICE)
Bilirubin Urine: NEGATIVE
Glucose, UA: NEGATIVE mg/dL
Hgb urine dipstick: NEGATIVE
Ketones, ur: NEGATIVE mg/dL
Leukocytes,Ua: NEGATIVE
Nitrite: NEGATIVE
Protein, ur: NEGATIVE mg/dL
Specific Gravity, Urine: >=1.03 (ref 1.005–1.030)
Urobilinogen, UA: 1 mg/dL (ref 0.0–1.0)
pH: 6 (ref 5.0–8.0)

## 2024-05-29 NOTE — Progress Notes (Signed)
 GYNECOLOGY OFFICE NOTE  History:  38 y.o. [id number] here today for urgent visit for pain and bleeding. Reports she has had chronic lower abdominal pain that feels like uterine cramping for over a year since her LEEP procedure in 12/2022. Reports pain happens primarily with her monthly periods (lasting 4-5 days) but also happens sometimes in between periods. She is having 3 days of worsening pain and very light spotting noted with wiping that ceased yesterday.   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 They are also seeking future fertility and had many questions about effects of LEEP on fertility and other options for treatment.   Past Medical History:  Diagnosis Date   Anemia    Anxiety    Asthma    Back pain, chronic    Bipolar disorder (HCC)    Diabetes type 2 (HCC)    Hypertension    Muscle spasm    PTSD (post-traumatic stress disorder)     Past Surgical History:  Procedure Laterality Date   LEEP  01/11/2023   MOLE REMOVAL     WISDOM TOOTH EXTRACTION       Current Outpatient Medications:    celecoxib (CELEBREX) 200 MG capsule, Take 200 mg by mouth 2 (two) times daily as needed., Disp: , Rfl:    ARIPiprazole  (ABILIFY ) 5 MG tablet, Take 1/2 tab daily for 1 week and than full tab daily (Patient not taking: Reported on 12/29/2023), Disp: 30 tablet, Rfl: 0   azithromycin  (ZITHROMAX ) 250 MG tablet, Take 1 tablet (250 mg total) by mouth daily. Take first 2 tablets together, then 1 every day until finished. (Patient not taking: Reported on 12/29/2023), Disp: 6 tablet, Rfl: 0   diclofenac  Sodium (VOLTAREN ) 1 % GEL, Apply 2 g topically 4 (four) times daily. (Patient not taking: Reported on 09/15/2023), Disp: 100 g, Rfl: 5   DULoxetine  (CYMBALTA ) 20 MG capsule, Take 2 capsules (40 mg  total) by mouth daily., Disp: 60 capsule, Rfl: 2   ferrous sulfate  325 (65 FE) MG EC tablet, Take 1 tablet (325 mg total) by mouth every other day. (Patient not taking: Reported on 09/15/2023), Disp: 15 tablet, Rfl: 11   fluticasone  (FLONASE ) 50 MCG/ACT nasal spray, Place 1 spray into both nostrils daily. (Patient not taking: Reported on 12/29/2023), Disp: 64 mL, Rfl: 3   guaiFENesin -dextromethorphan (ROBITUSSIN DM) 100-10 MG/5ML syrup, Take 5 mLs by mouth every 4 (four) hours as needed for cough. (Patient not taking: Reported on 09/15/2023), Disp: 118 mL, Rfl: 0   lamoTRIgine  (LAMICTAL ) 25 MG tablet, Take 1 tablet (25 mg total) by mouth daily for 14 days, THEN 2 tablets (50 mg total) daily. (Patient not taking: Reported on 12/29/2023), Disp: 60 tablet, Rfl: 1   naproxen  (NAPROSYN ) 500 MG tablet, Take 1 tablet (500 mg total) by mouth 2 (two) times daily. (Patient not taking: Reported on 12/29/2023), Disp: 30 tablet, Rfl: 0   promethazine -dextromethorphan (PROMETHAZINE -DM) 6.25-15 MG/5ML syrup, Take 5 mLs by mouth 4 (four) times daily as needed for cough. (Patient not taking: Reported on 12/29/2023), Disp: 118 mL, Rfl: 0   Semaglutide , 1 MG/DOSE, 4 MG/3ML SOPN, Inject 1 mg into the skin once a week., Disp: 3 mL, Rfl: 1   Semaglutide ,0.25 or 0.5MG /DOS, 2 MG/3ML SOPN, Inject 0.25 mg into the skin once a week.  Take for 4 weeks, if tolerating well, increase to 0.5 mg weekly injection. (Patient not taking: Reported on 12/29/2023), Disp: 3 mL, Rfl: 1   tranexamic acid  (LYSTEDA ) 650 MG TABS tablet, Take 2 tablets (1,300 mg total) by mouth 3 (three) times daily. Take during menses for a maximum of five days (Patient not taking: Reported on 12/29/2023), Disp: 30 tablet, Rfl: 6   traZODone  (DESYREL ) 50 MG tablet, Take 1 tablet (50 mg total) by mouth at bedtime. (Patient not taking: Reported on 12/29/2023), Disp: 30 tablet, Rfl: 1  The following portions of the patient's history were reviewed and updated as appropriate: allergies,  current medications, past family history, past medical history, past social history, past surgical history and problem list.   Review of Systems:  Pertinent items noted in HPI and remainder of comprehensive ROS otherwise negative.   Objective:  Physical Exam BP 127/85   Pulse 61   Wt 290 lb (131.5 kg)   BMI 39.33 kg/m  CONSTITUTIONAL: Well-developed, well-nourished female in no acute distress.  HENT:  Normocephalic, atraumatic. External right and left ear normal. Oropharynx is clear and moist EYES: Conjunctivae and EOM are normal. Pupils are equal, round, and reactive to light. No scleral icterus.  NECK: Normal range of motion, supple, no masses SKIN: Skin is warm and dry. No rash noted. Not diaphoretic. No erythema. No pallor. NEUROLOGIC: Alert and oriented to person, place, and time. Normal reflexes, muscle tone coordination. No cranial nerve deficit noted. PSYCHIATRIC: Normal mood and affect. Normal behavior. Normal judgment and thought content. CARDIOVASCULAR: Normal heart rate noted RESPIRATORY: Effort normal, no problems with respiration noted ABDOMEN: Soft, no distention noted. Mild tenderness in center lower pelvis   PELVIC: Normal appearing external genitalia; normal appearing vaginal mucosa and cervix.  No abnormal discharge noted.  pelvic cultures obtained. Normal uterine size, no other palpable masses, mild right adnexal tenderness, mild anterior vaginal wall tenderness, mild posterior vaginal wall tenderness, no cervical or uterine tenderness. MUSCULOSKELETAL: Normal range of motion. No edema noted.  Exam done with chaperone present.  Labs and Imaging No results found.  Assessment & Plan:  1. Abnormal urinalysis (Primary) - Urine Culture  2. Vaginal discharge - Cervicovaginal ancillary only  3. Pelvic pain - suspect infectious due to irregular bleeding and urinary frequency, no cervical or uterine tenderness - urine culture, pelvic culture today - may also be  simply cramping related to periods as it tends to occur with periods and she did have some bleeding a few days ago, ddx endometriosis, adenomyosis - desires fertility  4. Abnormal uterine bleeding (AUB) Patient had several days of spotting in between periods, no longer bleeding - would not recommend acute treatment as she is not bleeding - may need EMB for abnormal bleeding between periods F/u with Dr. Jeralyn for LEEP and ongoing management   Routine preventative health maintenance measures emphasized. Please refer to After Visit Summary for other counseling recommendations.   Return in about 13 days (around 06/11/2024).  Total face-to-face time with patient: 35 minutes. Over 50% of encounter was spent on counseling and coordination of care.   LOIS Yolanda Moats, MD, [facility] Attending Center for [employer])

## 2024-05-30 LAB — CERVICOVAGINAL ANCILLARY ONLY
Bacterial Vaginitis (gardnerella): NEGATIVE
Candida Glabrata: NEGATIVE
Candida Vaginitis: NEGATIVE
Chlamydia: NEGATIVE
Comment: NEGATIVE
Comment: NEGATIVE
Comment: NEGATIVE
Comment: NEGATIVE
Comment: NEGATIVE
Comment: NORMAL
Neisseria Gonorrhea: NEGATIVE
Trichomonas: NEGATIVE

## 2024-06-01 LAB — URINE CULTURE

## 2024-06-02 ENCOUNTER — Ambulatory Visit: Payer: Self-pay | Admitting: Obstetrics and Gynecology

## 2024-06-11 ENCOUNTER — Ambulatory Visit: Payer: MEDICAID | Admitting: Obstetrics and Gynecology

## 2024-07-16 ENCOUNTER — Ambulatory Visit: Payer: MEDICAID | Admitting: Obstetrics and Gynecology

## 2024-07-16 NOTE — Progress Notes (Signed)
 Patient did not show for her appointment, she will be called to reschedule.

## 2024-09-20 ENCOUNTER — Ambulatory Visit: Payer: MEDICAID | Admitting: Obstetrics and Gynecology

## 2024-09-21 ENCOUNTER — Emergency Department (HOSPITAL_COMMUNITY)
Admission: EM | Admit: 2024-09-21 | Discharge: 2024-09-22 | Disposition: A | Payer: MEDICAID | Attending: Emergency Medicine | Admitting: Emergency Medicine

## 2024-09-21 ENCOUNTER — Other Ambulatory Visit: Payer: Self-pay

## 2024-09-21 DIAGNOSIS — S46912A Strain of unspecified muscle, fascia and tendon at shoulder and upper arm level, left arm, initial encounter: Secondary | ICD-10-CM | POA: Insufficient documentation

## 2024-09-21 DIAGNOSIS — X509XXA Other and unspecified overexertion or strenuous movements or postures, initial encounter: Secondary | ICD-10-CM | POA: Insufficient documentation

## 2024-09-21 DIAGNOSIS — M25512 Pain in left shoulder: Secondary | ICD-10-CM | POA: Diagnosis present

## 2024-09-21 DIAGNOSIS — N939 Abnormal uterine and vaginal bleeding, unspecified: Secondary | ICD-10-CM
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
## 2024-09-22 ENCOUNTER — Emergency Department (HOSPITAL_COMMUNITY): Payer: MEDICAID

## 2024-09-22 MED ORDER — NAPROXEN 500 MG PO TABS
500.0000 mg | ORAL_TABLET | Freq: Once | ORAL | Status: AC
  Administered 2024-09-22: 500 mg via ORAL
  Filled 2024-09-22: qty 1

## 2024-09-22 MED ORDER — CELECOXIB 200 MG PO CAPS: 200.0000 mg | ORAL_CAPSULE | Freq: Two times a day (BID) | ORAL | 0 refills | Status: AC | PRN
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
## 2024-09-22 NOTE — Discharge Instructions (Addendum)
 Take Celebrex  as prescribed. Follow up with orthopedics, call to schedule an appointment.

## 2024-11-01 ENCOUNTER — Ambulatory Visit: Payer: Self-pay | Admitting: Obstetrics and Gynecology
